# Patient Record
Sex: Female | Born: 1980 | Race: Black or African American | Hispanic: No | Marital: Single | Smoking: Former smoker
Health system: Southern US, Community
[De-identification: ages and names within clinical notes are randomized; demographics above are authoritative.]

## PROBLEM LIST (undated history)

## (undated) DIAGNOSIS — I340 Nonrheumatic mitral (valve) insufficiency: Secondary | ICD-10-CM

## (undated) DIAGNOSIS — I10 Essential (primary) hypertension: Secondary | ICD-10-CM

## (undated) DIAGNOSIS — I509 Heart failure, unspecified: Secondary | ICD-10-CM

## (undated) HISTORY — DX: Nonrheumatic mitral (valve) insufficiency: I34.0

## (undated) HISTORY — DX: Essential (primary) hypertension: I10

---

## 2006-07-18 ENCOUNTER — Encounter: Admission: RE | Admit: 2006-07-18 | Discharge: 2006-07-18 | Payer: Self-pay | Admitting: Nephrology

## 2008-06-10 ENCOUNTER — Ambulatory Visit (HOSPITAL_COMMUNITY): Admission: RE | Admit: 2008-06-10 | Discharge: 2008-06-10 | Payer: Self-pay | Admitting: Family Medicine

## 2008-10-16 ENCOUNTER — Inpatient Hospital Stay (HOSPITAL_COMMUNITY): Admission: AD | Admit: 2008-10-16 | Discharge: 2008-10-16 | Payer: Self-pay | Admitting: Obstetrics

## 2008-10-28 ENCOUNTER — Observation Stay (HOSPITAL_COMMUNITY): Admission: RE | Admit: 2008-10-28 | Discharge: 2008-10-28 | Payer: Self-pay | Admitting: Family Medicine

## 2008-10-31 ENCOUNTER — Observation Stay (HOSPITAL_COMMUNITY): Admission: AD | Admit: 2008-10-31 | Discharge: 2008-10-31 | Payer: Self-pay | Admitting: Obstetrics & Gynecology

## 2008-11-03 ENCOUNTER — Inpatient Hospital Stay (HOSPITAL_COMMUNITY): Admission: AD | Admit: 2008-11-03 | Discharge: 2008-11-05 | Payer: Self-pay | Admitting: Obstetrics & Gynecology

## 2008-11-03 ENCOUNTER — Encounter: Payer: Self-pay | Admitting: Obstetrics & Gynecology

## 2008-11-03 ENCOUNTER — Ambulatory Visit: Payer: Self-pay | Admitting: Obstetrics and Gynecology

## 2009-03-30 ENCOUNTER — Encounter: Payer: Self-pay | Admitting: Cardiology

## 2009-04-22 ENCOUNTER — Ambulatory Visit: Payer: Self-pay | Admitting: Cardiology

## 2009-04-22 DIAGNOSIS — N12 Tubulo-interstitial nephritis, not specified as acute or chronic: Secondary | ICD-10-CM | POA: Insufficient documentation

## 2009-04-22 DIAGNOSIS — F4000 Agoraphobia, unspecified: Secondary | ICD-10-CM | POA: Insufficient documentation

## 2009-04-22 DIAGNOSIS — R002 Palpitations: Secondary | ICD-10-CM

## 2009-04-22 DIAGNOSIS — R55 Syncope and collapse: Secondary | ICD-10-CM | POA: Insufficient documentation

## 2009-04-22 DIAGNOSIS — R9431 Abnormal electrocardiogram [ECG] [EKG]: Secondary | ICD-10-CM

## 2009-04-22 DIAGNOSIS — G43909 Migraine, unspecified, not intractable, without status migrainosus: Secondary | ICD-10-CM | POA: Insufficient documentation

## 2009-04-22 DIAGNOSIS — R42 Dizziness and giddiness: Secondary | ICD-10-CM

## 2009-04-23 LAB — CONVERTED CEMR LAB
CO2: 21 meq/L (ref 19–32)
Magnesium: 2 mg/dL (ref 1.5–2.5)
Sodium: 137 meq/L (ref 135–145)
TSH: 0.33 microintl units/mL — ABNORMAL LOW (ref 0.35–5.50)

## 2009-04-30 ENCOUNTER — Encounter: Payer: Self-pay | Admitting: Cardiology

## 2009-04-30 ENCOUNTER — Ambulatory Visit: Payer: Self-pay | Admitting: Internal Medicine

## 2009-04-30 ENCOUNTER — Ambulatory Visit (HOSPITAL_COMMUNITY): Admission: RE | Admit: 2009-04-30 | Discharge: 2009-04-30 | Payer: Self-pay | Admitting: Cardiology

## 2009-04-30 ENCOUNTER — Ambulatory Visit: Payer: Self-pay

## 2009-05-11 ENCOUNTER — Encounter: Payer: Self-pay | Admitting: Cardiology

## 2009-05-12 ENCOUNTER — Ambulatory Visit: Payer: Self-pay | Admitting: Cardiology

## 2009-05-12 DIAGNOSIS — I428 Other cardiomyopathies: Secondary | ICD-10-CM

## 2009-05-14 ENCOUNTER — Telehealth: Payer: Self-pay | Admitting: Cardiology

## 2009-05-26 ENCOUNTER — Ambulatory Visit: Payer: Self-pay | Admitting: Cardiology

## 2009-05-26 ENCOUNTER — Encounter (INDEPENDENT_AMBULATORY_CARE_PROVIDER_SITE_OTHER): Payer: Self-pay | Admitting: Nurse Practitioner

## 2009-05-29 ENCOUNTER — Telehealth: Payer: Self-pay | Admitting: Cardiology

## 2009-06-16 ENCOUNTER — Ambulatory Visit: Payer: Self-pay

## 2009-06-16 ENCOUNTER — Ambulatory Visit: Payer: Self-pay | Admitting: Internal Medicine

## 2009-06-16 ENCOUNTER — Encounter (INDEPENDENT_AMBULATORY_CARE_PROVIDER_SITE_OTHER): Payer: Self-pay | Admitting: Nurse Practitioner

## 2009-06-16 DIAGNOSIS — I10 Essential (primary) hypertension: Secondary | ICD-10-CM

## 2009-06-16 LAB — CONVERTED CEMR LAB
CO2: 27 meq/L (ref 19–32)
Calcium: 9 mg/dL (ref 8.4–10.5)
Pro B Natriuretic peptide (BNP): 29 pg/mL (ref 0.0–100.0)

## 2009-06-24 ENCOUNTER — Telehealth: Payer: Self-pay | Admitting: Cardiology

## 2009-06-25 ENCOUNTER — Ambulatory Visit: Payer: Self-pay | Admitting: Internal Medicine

## 2009-06-30 ENCOUNTER — Telehealth: Payer: Self-pay | Admitting: Cardiology

## 2009-07-02 ENCOUNTER — Ambulatory Visit: Payer: Self-pay | Admitting: Cardiology

## 2009-07-09 ENCOUNTER — Telehealth (INDEPENDENT_AMBULATORY_CARE_PROVIDER_SITE_OTHER): Payer: Self-pay | Admitting: *Deleted

## 2009-07-14 ENCOUNTER — Encounter: Payer: Self-pay | Admitting: Cardiology

## 2009-07-20 ENCOUNTER — Ambulatory Visit: Payer: Self-pay | Admitting: Cardiology

## 2009-07-20 DIAGNOSIS — R079 Chest pain, unspecified: Secondary | ICD-10-CM

## 2009-07-20 DIAGNOSIS — I080 Rheumatic disorders of both mitral and aortic valves: Secondary | ICD-10-CM

## 2009-08-25 ENCOUNTER — Ambulatory Visit: Payer: Self-pay | Admitting: Cardiology

## 2009-08-27 ENCOUNTER — Telehealth: Payer: Self-pay | Admitting: Cardiology

## 2009-09-10 ENCOUNTER — Telehealth: Payer: Self-pay | Admitting: Cardiology

## 2009-09-16 ENCOUNTER — Telehealth (INDEPENDENT_AMBULATORY_CARE_PROVIDER_SITE_OTHER): Payer: Self-pay | Admitting: *Deleted

## 2009-09-18 ENCOUNTER — Encounter (INDEPENDENT_AMBULATORY_CARE_PROVIDER_SITE_OTHER): Payer: Self-pay | Admitting: *Deleted

## 2009-10-14 ENCOUNTER — Telehealth: Payer: Self-pay | Admitting: Cardiology

## 2009-10-14 ENCOUNTER — Ambulatory Visit: Payer: Self-pay | Admitting: Cardiology

## 2009-10-17 LAB — CONVERTED CEMR LAB
BUN: 8 mg/dL (ref 6–23)
CO2: 23 meq/L (ref 19–32)
Chloride: 111 meq/L (ref 96–112)
Creatinine, Ser: 0.7 mg/dL (ref 0.4–1.2)
Glucose, Bld: 72 mg/dL (ref 70–99)
Potassium: 4.1 meq/L (ref 3.5–5.1)
Sodium: 140 meq/L (ref 135–145)

## 2010-07-27 NOTE — Assessment & Plan Note (Signed)
Summary: Increased SOB  pfh,rn   Visit Type:  Follow-up Primary Provider:  Dr. Bing Neighbors. Harper  CC:  CHF.  History of Present Illness: The patient presents for followup of the above. Since I last saw her she has had increasing shortness of breath. She reports that this is happening with activities such as walking about 100 yards on level ground. She occasionally has some shortness of breath at night lying flat. She doesn't weigh herself. She doesn't report swelling. She's not having a cough. She's had no fevers or chills. She has not had chest pain or palpitations. She is trying to watch her salt. She tolerated the initiation of enalapril at the last visit. She rarely now breast-feed her child.  Current Medications (verified): 1)  Ibuprofen 200 Mg Tabs (Ibuprofen) .... As Needed 2)  Ortho Micronor 0.35 Mg Tabs (Norethindrone (Contraceptive)) .... Once Daily At The Same Time 3)  Enalapril Maleate 2.5 Mg Tabs (Enalapril Maleate) .Marland Kitchen.. 1 By Mouth Two Times A Day 4)  Metoprolol Tartrate 100 Mg Tabs (Metoprolol Tartrate) .... 1/2 Tablet Twice A Day  Allergies (verified): 1)  ! Hydrocodone  Past History:  Past Medical History: Reviewed history from 07/20/2009 and no changes required. Pyelonephritis recurrent Cardiomyopathy Mitral regurgitation  Review of Systems       As stated in the HPI and negative for all other systems.   Vital Signs:  Patient profile:   30 year old female Height:      67 inches Weight:      204 pounds BMI:     32.07 Pulse rate:   72 / minute Resp:     16 per minute BP sitting:   124 / 86  (right arm)  Vitals Entered By: Marrion Coy, CNA (October 14, 2009 3:41 PM)  Physical Exam  General:  Well developed, well nourished, in no acute distress. Head:  normocephalic and atraumatic Eyes:  PERRLA/EOM intact; conjunctiva and lids normal. Mouth:  Teeth, gums and palate normal. Oral mucosa normal. Neck:  Neck supple, no JVD. No masses, thyromegaly or abnormal  cervical nodes. Chest Wall:  no deformities  Lungs:  Clear bilaterally to auscultation and percussion. Abdomen:  Bowel sounds positive; abdomen soft and non-tender without masses, organomegaly, or hernias noted. No hepatosplenomegaly. Msk:  Back normal, normal gait. Muscle strength and tone normal. Extremities:  No clubbing or cyanosis. Neurologic:  Alert and oriented x 3. Skin:  Intact without lesions or rashes. Psych:  Normal affect.   Detailed Cardiovascular Exam  Neck    Carotids: Carotids full and equal bilaterally without bruits.      Neck Veins: Normal, no JVD.    Heart    Inspection: no deformities or lifts noted.      Palpation: normal PMI with no thrills palpable.      Auscultation: regular rate and rhythm, S1, S2 without murmurs, rubs, gallops, or clicks.    Vascular    Abdominal Aorta: no palpable masses, pulsations, or audible bruits.      Femoral Pulses: normal femoral pulses bilaterally.      Pedal Pulses: normal pedal pulses bilaterally.      Radial Pulses: normal radial pulses bilaterally.      Peripheral Circulation: no clubbing, cyanosis, or edema noted with normal capillary refill.     Impression & Recommendations:  Problem # 1:  OTHER PRIMARY CARDIOMYOPATHIES (ICD-425.4) Today  I will change her to carvedilol and she is now rarely breast-feeding and is acceptable of this. It was agreed upon  by her pediatrician that she could start enalapril and we did that last time. I will up titrate this to 5 mg b.i.d. I will get a basic metabolic profile and BNP level today. I will give her 2 days of Lasix 20 mg and potassium 10 given the increased dyspnea.  Problem # 2:  HYPERTENSION, BENIGN (ICD-401.1) Her blood pressures being managed in the context of treating her cardiomyopathy.  Problem # 3:  MITRAL REGURGITATION (ICD-396.3) I will follow this up with an echocardiogram when I have finished titrating her meds were sent her clinically indicated.  Patient  Instructions: 1)  Your physician recommends that you schedule a follow-up appointment in: 1 month with Dr Antoine Poche 2)  Your physician has recommended you make the following change in your medication:  Stop Metoprolol, start Carvedilol 12.5 mg one twice a day, Incerase Enalapril to 5 mg twice a day, take Furosemide 20 mg for 2 days with Potassium Chlo 10 Meq for 2 days. 3)  Your physician has requested that you limit the intake of sodium (salt) in your diet to two grams daily. Please see [facility] handout. 4)  You have been diagnosed with Congestive Heart Failure or CHF.  CHF is a condition in which a problem with the structure or function of the heart impairs its ability to supply sufficient blood flow to meet the body's needs.  For further information please visit www.cardiosmart.org for detailed information on CHF. 5)  Your physician recommends that you weigh, daily, at the same time every day, and in the same amount of clothing.  Please record your daily weights on the handout provided and bring it to your next appointment. Prescriptions: POTASSIUM CHLORIDE CR 10 MEQ CR-TABS (POTASSIUM CHLORIDE) once a day with Furosemide  #30 x 0   Entered by:   Charolotte Capuchin, RN   Authorized by:   Rollene Rotunda, MD, [facility]   Signed by:   Charolotte Capuchin, RN on 10/14/2009   Method used:   Electronically to        [facility] [address] (retail)       [phone]       [place], [place]  [address]       Ph: [phone]       Fax: [phone]   RxID:   [id number] FUROSEMIDE 20 MG TABS (FUROSEMIDE) as directed  #30 x 0   Entered by:   Charolotte Capuchin, RN   Authorized by:   Rollene Rotunda, MD, [facility]   Signed by:   Charolotte Capuchin, RN on 10/14/2009   Method used:   Electronically to        [facility] [address] (retail)       [phone]       [place], [place]  [address]       Ph: [phone]       Fax:  [phone]   RxID:   [id number] CARVEDILOL 12.5 MG TABS (CARVEDILOL) one twice a day  #60 x 11   Entered by:   Charolotte Capuchin, RN   Authorized by:   Rollene Rotunda, MD, [facility]   Signed by:   Charolotte Capuchin, RN on 10/14/2009   Method used:   Electronically to        [facility] [address] (retail)       [phone]       [facility]  [place], [place]  [address]       Ph: [phone]       Fax: [phone]   RxID:   [id number] ENALAPRIL MALEATE 5 MG TABS (ENALAPRIL MALEATE) one twice a day  #60 x 6   Entered by:   Charolotte Capuchin, RN   Authorized by:   Rollene Rotunda, MD, [facility]   Signed by:   Charolotte Capuchin, RN on 10/14/2009   Method used:   Electronically to        [facility] [address] (retail)       [phone]       [place], [place]  [address]       Ph: [phone]       Fax: [phone]   RxID:   [id number]

## 2010-07-27 NOTE — Assessment & Plan Note (Signed)
Summary: htn   Visit Type:  Follow-up Primary Provider:  Dr. Bing Neighbors. Harper  CC:  Cardiomyopathy.  History of Present Illness: The patient presents for followup of the above. 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 She has been reluctant to take certain medications and some have been contraindicated as she is breast feeding. She is weaning herself off of breast-feeding. She says her blood pressures have been elevated at home. However, today's reading is low as reported. We are correlating her blood pressure cuff readings at home with hours in the office. Today I will titrate her beta blocker to metoprolol XL 50 mg daily. When she is not breast-feeding I will most likely switched to carvedilol and then to ACE inhibitors. She otherwise has been doing relatively well. She gets fatigued when she takes her beta blocker in the morning. However, she think she has some palpitations when she takes it at night. She's not had any presyncope or syncope. She has not had any shortness of breath, PND or orthopnea. She has no chest pain. She understands the risks of getting pregnant. She is using birth control.  Current Medications (verified): 1)  Ibuprofen 200 Mg Tabs (Ibuprofen) .... As Needed 2)  Metoprolol Succinate 25 Mg Xr24h-Tab (Metoprolol Succinate) .... Take One Tablet By Mouth Daily 3)  Ortho Micronor 0.35 Mg Tabs (Norethindrone (Contraceptive)) .... Once Daily At The Same Time 4)  Enalapril Maleate 5 Mg Tabs (Enalapril Maleate) .... Hold  Allergies (verified): 1)  ! Hydrocodone  Past History:  Past Medical History: Reviewed history from 05/12/2009 and no changes required. Pyelonephritis recurrent Cardiomyopathy  Review of Systems       As stated in the HPI and negative for all other systems.   Vital Signs:  Patient profile:   30 year old female Height:      67 inches Weight:      199 pounds BMI:     31.28 Pulse rate:   71 / minute Resp:     16 per minute BP sitting:    120 / 81  (left arm)  Vitals Entered By: Marrion Coy, CNA (July 02, 2009 9:34 AM)  Physical Exam  General:  The patient was alert and oriented in no acute distress. HEENT Normal.  Neck veins were flat, carotids were brisk.  Lungs were clear.  Heart sounds were regular without murmurs or gallops.  Abdomen was soft with active bowel sounds. There is no clubbing cyanosis or edema. Skin Warm and dry  Head:  normocephalic and atraumatic Eyes:  PERRLA/EOM intact; conjunctiva and lids normal. Mouth:  Teeth, gums and palate normal. Oral mucosa normal. Neck:  Neck supple, no JVD. No masses, thyromegaly or abnormal cervical nodes. Chest Wall:  no deformities or breast masses noted Lungs:  Clear bilaterally to auscultation and percussion. Heart:  Non-displaced PMI, chest non-tender; regular rate and rhythm, S1, S2 without murmurs, rubs or gallops. Carotid upstroke normal, no bruit. Normal abdominal aortic size, no bruits. Femorals normal pulses, no bruits. Pedals normal pulses. No edema, no varicosities. Abdomen:  Bowel sounds positive; abdomen soft and non-tender without masses, organomegaly, or hernias noted. No hepatosplenomegaly. Msk:  Back normal, normal gait. Muscle strength and tone normal. Neurologic:  Alert and oriented x 3. Skin:  Intact without lesions or rashes. Psych:  Normal affect.   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 She will not start the ACE inhibitor until she is  done breast-feeding. She will let me know when this happens. If she has increasing palpitations, presyncope or syncope I need to know immediately.  Problem # 2:  HYPERTENSION, BENIGN (ICD-401.1) We checked her blood pressure and it is normal. Her blood pressure cuff is not correlating at home. She will exchange this. I will continue to titrate her meds however.  Patient Instructions: 1)  Your physician recommends  that you schedule a follow-up appointment in: as scheduled 2)  Your physician has recommended you make the following change in your medication: Increase Metoprolol to 50 mg a day 3)  You have been diagnosed with Congestive Heart Failure or CHF.  CHF is a condition in which a problem with the structure or function of the heart impairs its ability to supply sufficient blood flow to meet the body's needs.  For further information please visit www.cardiosmart.org for detailed information on CHF. 4)  Your physician recommends that you weigh, daily, at the same time every day, and in the same amount of clothing.  Please record your daily weights on the handout provided and bring it to your next appointment. Prescriptions: METOPROLOL SUCCINATE 50 MG XR24H-TAB (METOPROLOL SUCCINATE) one daily  #30 x 11   Entered by:   Charolotte Capuchin, RN   Authorized by:   Rollene Rotunda, MD, [facility]   Signed by:   Charolotte Capuchin, RN on 07/02/2009   Method used:   Electronically to        [facility]  [address] [phone]* (retail)       [address]       [place], [place]  [address]       Ph: [phone] or [phone]       Fax: [phone]   RxID:   [id number]

## 2010-07-27 NOTE — Progress Notes (Signed)
Summary: Request call about medication and her appt on Monday  Phone Note Call from Patient Call back at Home Phone [phone]   Caller: Patient Summary of Call: Pt request call regarding her medication and appt on Monday Initial call taken by: Judie Grieve,  September 10, 2009 11:37 AM  Follow-up for Phone Call        when she got rx filled her Medicaid was cancelled. needs to cancel appointment until she gets that straightened out because she cant afford to be seen any other way.  needs rx for Metoprolol sent to [facility]. Follow-up by: Charolotte Capuchin, RN,  September 10, 2009 4:14 PM    New/Updated Medications: METOPROLOL TARTRATE 100 MG TABS (METOPROLOL TARTRATE) 1/2 tablet twice a day Prescriptions: METOPROLOL TARTRATE 100 MG TABS (METOPROLOL TARTRATE) 1/2 tablet twice a day  #30 x 11   Entered by:   Charolotte Capuchin, RN   Authorized by:   Rollene Rotunda, MD, [facility]   Signed by:   Charolotte Capuchin, RN on 09/10/2009   Method used:   Electronically to        [facility] [address] (retail)       [phone]       [place], [place]  [address]       Ph: [phone]       Fax: [phone]   RxID:   [id number]

## 2010-07-27 NOTE — Progress Notes (Signed)
Summary: appt/meds  Phone Note Call from Patient Call back at Home Phone [phone]   Caller: Patient Reason for Call: Talk to Nurse Summary of Call: request to speak to nurse about appt and meds Initial call taken by: Migdalia Dk,  October 14, 2009 10:14 AM  Follow-up for Phone Call        very SOB, appt given for 3:15 today. Follow-up by: Charolotte Capuchin, RN,  October 14, 2009 1:29 PM

## 2010-07-27 NOTE — Miscellaneous (Signed)
Summary: Orders Update  Clinical Lists Changes  Orders: Added new Test order of TLB-BMP (Basic Metabolic Panel-BMET) (80048-METABOL) - Signed Added new Test order of TLB-BNP (B-Natriuretic Peptide) (83880-BNPR) - Signed 

## 2010-07-27 NOTE — Assessment & Plan Note (Signed)
Summary: per check out/sf   Visit Type:  Follow-up Referring Provider:  Dr. Elvis Coil Primary Provider:  Dr. Bing Neighbors. Sarah Reilly  CC:  CHF.  History of Present Illness: The patient presents for followup of her cardiomyopathy. At the last appointment we increased the Toprol to 50 mg daily. She has had no new problems with this.  She is still short of breath walking 30 yards on level ground. She is not describing PND or orthopnea. She might get short of breath when she is in conversation. She does have some chest discomfort that occurs with certain movements. It can be a left shoulder. His moderate in intensity. There are no associated symptoms. She thinks this might be related to stress as her father is undergoing surgery and she has no close relatives in [place].  Current Medications (verified): 1)  Ibuprofen 200 Mg Tabs (Ibuprofen) .... As Needed 2)  Metoprolol Succinate 50 Mg Xr24h-Tab (Metoprolol Succinate) .... One Daily 3)  Ortho Micronor 0.35 Mg Tabs (Norethindrone (Contraceptive)) .... Once Daily At The Same Time 4)  Enalapril Maleate 5 Mg Tabs (Enalapril Maleate) .... Hold  Allergies (verified): 1)  ! Hydrocodone  Past History:  Past Medical History: Pyelonephritis recurrent Cardiomyopathy Mitral regurgitation  Review of Systems       As stated in the HPI and negative for all other systems.   Vital Signs:  Patient profile:   30 year old female Height:      67 inches Weight:      202 pounds BMI:     31.75 Pulse rate:   74 / minute Resp:     16 per minute BP sitting:   117 / 73  (right arm)  Vitals Entered By: Marrion Coy, CNA (July 20, 2009 10:02 AM)  Physical Exam  General:  Well developed, well nourished, in no acute distress. Head:  normocephalic and atraumatic Eyes:  PERRLA/EOM intact; conjunctiva and lids normal. Mouth:  Teeth, gums and palate normal. Oral mucosa normal. Neck:  Neck supple, no JVD. No masses, thyromegaly or abnormal cervical  nodes. Chest Wall:  no deformities or breast masses noted Lungs:  Clear bilaterally to auscultation and percussion. Abdomen:  Bowel sounds positive; abdomen soft and non-tender without masses, organomegaly, or hernias noted. No hepatosplenomegaly. Msk:  Back normal, normal gait. Muscle strength and tone normal. Extremities:  No clubbing or cyanosis. Neurologic:  Alert and oriented x 3. Skin:  Intact without lesions or rashes. Cervical Nodes:  no significant adenopathy Axillary Nodes:  no significant adenopathy Inguinal Nodes:  no significant adenopathy Psych:  Normal affect.   Detailed Cardiovascular Exam  Neck    Carotids: Carotids full and equal bilaterally without bruits.      Neck Veins: Normal, no JVD.    Heart    Inspection: no deformities or lifts noted.      Palpation: normal PMI with no thrills palpable.      Auscultation: regular rate and rhythm, S1, S2 without murmurs, rubs, gallops, or clicks.    Vascular    Abdominal Aorta: no palpable masses, pulsations, or audible bruits.      Femoral Pulses: normal femoral pulses bilaterally.      Pedal Pulses: normal pedal pulses bilaterally.      Radial Pulses: normal radial pulses bilaterally.      Peripheral Circulation: no clubbing, cyanosis, or edema noted with normal capillary refill.     EKG  Procedure date:  07/20/2009  Findings:      sinus rhythm, rate 73,  axis within normal limits, intervals within normal limits, LVH by voltage criteria, diffuse T wave inversions unchanged from previous  Impression & Recommendations:  Problem # 1:  OTHER PRIMARY CARDIOMYOPATHIES (ICD-425.4) The patient is still breast-feeding. Therefore, I cannot start an ACE inhibitor. She does not want to use carvedilol per previous conversations. I will titrate her Toprol to 20 5 in the evening in addition to the 50 mg of XL in the morning. She is euvolemic by today's exam. Her BNP done a few weeks ago was within normal limits. In one month she  expects to be done breast-feeding at which point I will begin ACE inhibitors and transition to carvedilol.  Problem # 2:  MITRAL REGURGITATION (ICD-396.3) She does have moderate mitral regurgitation on the previous echo. We will follow this up in the months to come when I have titrated her meds to see if we have had improvement in ejection fraction and MR.  Problem # 3:  CHEST PAIN (ICD-786.50) This is intermittent and exacerbated by certain movements. He is very atypical for angina.  She has no cardiovascular risk factors. At this point I think the pretest probability of obstructive coronary disease is very low. She will need stress perfusion imaging in the future to evaluate etiology of her cardiomyopathy. When I next see her I will plan a stress perfusion study though I do not think one is needed acutely for these current symptoms.  Patient Instructions: 1)  Your physician recommends that you schedule a follow-up appointment in: 1 month with Dr Antoine Poche 2)  Your physician has recommended you make the following change in your medication: Increase metoprolol to 50 in am the 25 mg in pm 3)  You have been diagnosed with Congestive Heart Failure or CHF.  CHF is a condition in which a problem with the structure or function of the heart impairs its ability to supply sufficient blood flow to meet the body's needs.  For further information please visit www.cardiosmart.org for detailed information on CHF. 4)  Your physician recommends that you weigh, daily, at the same time every day, and in the same amount of clothing.  Please record your daily weights on the handout provided and bring it to your next appointment.

## 2010-07-27 NOTE — Assessment & Plan Note (Signed)
Summary: 1 MONTH ROV/SL   Visit Type:  Follow-up Primary Provider:  Dr. Bing Neighbors. Harper  CC:  CHF.  History of Present Illness: The patient presents for followup of her cardiomyopathy. We have a lady medication choices as she has been breast-feeding. She is going to start breast-feeding once a day and was advised by her pediatrician that she could start enalapril. She complains still of shortness of breath with moderate to mild activities such as walking on level ground and 25 yards. She think she has a little trouble lying flat back with shortness of breath. She is not describing new swelling. She has had a steady weight gain probably because of dietary indiscretion. I don't believe that she is completely compliant with salt restriction. She's not describing new palpitations, presyncope or syncope. She's had occasional sharp shooting chest pain and neck pain not reproducible.  Current Medications (verified): 1)  Ibuprofen 200 Mg Tabs (Ibuprofen) .... As Needed 2)  Metoprolol Succinate 50 Mg Xr24h-Tab (Metoprolol Succinate) .... One in Am and 1/2 in Pm 3)  Ortho Micronor 0.35 Mg Tabs (Norethindrone (Contraceptive)) .... Once Daily At The Same Time 4)  Enalapril Maleate 5 Mg Tabs (Enalapril Maleate) .... 1/2 Tablet Two Times A Day  Allergies (verified): 1)  ! Hydrocodone  Past History:  Past Medical History: Reviewed history from 07/20/2009 and no changes required. Pyelonephritis recurrent Cardiomyopathy Mitral regurgitation  Review of Systems       As stated in the HPI and negative for all other systems.   Vital Signs:  Patient profile:   30 year old female Height:      67 inches Weight:      203 pounds BMI:     31.91 Pulse rate:   75 / minute Resp:     16 per minute BP sitting:   126 / 78  (right arm)  Vitals Entered By: Marrion Coy, CNA (August 25, 2009 11:18 AM)  Physical Exam  General:  Well developed, well nourished, in no acute distress. Head:  normocephalic  and atraumatic Eyes:  PERRLA/EOM intact; conjunctiva and lids normal. Mouth:  Teeth, gums and palate normal. Oral mucosa normal. Neck:  Neck supple, no JVD. No masses, thyromegaly or abnormal cervical nodes. Chest Wall:  no deformities or breast masses noted Lungs:  Clear bilaterally to auscultation and percussion. Heart:  Non-displaced PMI, chest non-tender; regular rate and rhythm, S1, S2 without murmurs, rubs or gallops. Carotid upstroke normal, no bruit. Normal abdominal aortic size, no bruits. Femorals normal pulses, no bruits. Pedals normal pulses. No edema, no varicosities. Abdomen:  Bowel sounds positive; abdomen soft and non-tender without masses, organomegaly, or hernias noted. No hepatosplenomegaly. Msk:  Back normal, normal gait. Muscle strength and tone normal. Extremities:  No clubbing or cyanosis. Skin:  Intact without lesions or rashes. Psych:  Normal affect.   EKG  Procedure date:  08/25/2009  Findings:      sinus rhythm, rate 75, axis within normal limits, intervals within normal limits, inferolateral T wave inversion unchanged from previous.  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 We did research and agree with her pediatrician that she can now start the enalapril 2.5 mg twice daily. She will continue on the beta blocker. I reemphasized salt restriction and weight loss.  Problem # 2:  HYPERTENSION, BENIGN (ICD-401.1) Her blood pressure will be treated in the context of managing her cardiomyopathy.  Problem #  3:  PALPITATIONS (ICD-785.1) She has no new complaints of this and no further testing is indicated at this point. Orders: EKG w/ Interpretation (93000)  Patient Instructions: 1)  Your physician recommends that you schedule a follow-up appointment in: 2 WEEKS 2)  Your physician has recommended you make the following change in your medication:  START ENALAPRIL 2.5 MG TWICE A DAY

## 2010-07-27 NOTE — Progress Notes (Signed)
  Recieved Request for Records fron [facility] forwarded to [facility] for processing. Sarah Reilly  July 09, 2009 8:37 AM

## 2010-07-27 NOTE — Progress Notes (Signed)
  Recieved Request for Records from [facility] forwarded to [facility] for processing [facility]  September 16, 2009 8:40 AM

## 2010-07-27 NOTE — Letter (Signed)
Summary: Appointment - Missed  [facility], Main Office  [address]   [place], [place] [address]   Phone: [phone]  Fax: [phone]     September 18, 2009 MRN: [id number]   DAJHA URQUILLA [address] [place], [place]  [address]   Dear Ms. Kempa,  Our records indicate you missed your appointment on March 21,2011                       with Dr.Hochrein.  It is very important that we reach you to reschedule this appointment. We look forward to participating in your health care needs. Please contact us at the number listed above at your earliest convenience to reschedule this appointment.     Sincerely,  Lorne Skeens  [facility] Scheduling Team

## 2010-07-27 NOTE — Procedures (Signed)
Summary: blood pressure report  blood pressure report   Imported By: Mirna Mires 07/28/2009 14:33:48  _____________________________________________________________________  External Attachment:    Type:   Image     Comment:   External Document

## 2010-07-27 NOTE — Progress Notes (Signed)
Summary: needs letter dictated per JH//BP ELEVATED  Phone Note Call from Patient Call back at Home Phone [phone]   Caller: Patient Reason for Call: Talk to Nurse Summary of Call: PT STATES WE WERE TO CALL HER YESTERDAY... HAS NOT HEARD ANYTHING  Initial call taken by: Migdalia Dk,  June 30, 2009 11:46 AM  Follow-up for Phone Call        still needs letter for disability.  Also saw Dr Crawford Givens week and she understood him to say if she didn't take the beta blockers she would "be dead within 6 months".  Pt is concerned because that is not what she understood from Dr Antoine Poche.  Also pt.s BP is remaining elevated per her blood pressure monitor, she is requested to come in Thursday to see me to check her monitor.  She also hasn't started Enalapril as ordered d/t concerns r/t medications.  Will have Dr Antoine Poche review and contact pt.  Sander Nephew, RN Follow-up by: Charolotte Capuchin, RN,  June 30, 2009 6:49 PM  Additional Follow-up for Phone Call Additional follow up Details #1::        This patient needs to come in for another appt to discuss her reservations about meds and plans for breastfeeding. Additional Follow-up by: Rollene Rotunda, MD, [facility],  June 30, 2009 6:42 PM    Additional Follow-up for Phone Call Additional follow up Details #2::    appt given for 07/02/2009 at 9:30  Follow-up by: Charolotte Capuchin, RN,  July 02, 2009 9:22 AM

## 2010-07-27 NOTE — Progress Notes (Signed)
Summary: REFILL  Phone Note Refill Request Message from:  Patient on August 27, 2009 11:00 AM  Refills Requested: Medication #1:  ENALAPRIL MALEATE 5 MG TABS 1/2 TABLET two times a day. PT NEED THIS MEDICATION CHANGED TO ENAPRIL 2.5 MG [facility] [address] [phone]  Initial call taken by: Judie Grieve,  August 27, 2009 11:02 AM  Follow-up for Phone Call        Spoke with pt. Pt wants to take 2.5mg  two times a day instead of halfing 5mg  tabs. Rx sent into pharmacy. Marrion Coy, CNA  August 27, 2009 11:09 AM  Follow-up by: Marrion Coy, CNA,  August 27, 2009 11:09 AM    New/Updated Medications: ENALAPRIL MALEATE 2.5 MG TABS (ENALAPRIL MALEATE) 1 by mouth two times a day Prescriptions: ENALAPRIL MALEATE 2.5 MG TABS (ENALAPRIL MALEATE) 1 by mouth two times a day  #60 x 6   Entered by:   Marrion Coy, CNA   Authorized by:   Rollene Rotunda, MD, [facility]   Signed by:   Marrion Coy, CNA on 08/27/2009   Method used:   [facility] to        [facility]  [address] [phone][facility] (retail)       [address]       [place], [place]  [address]       Ph: [phone] or [phone]       Fax: [phone]   RxID:   [id number]

## 2010-10-05 LAB — CBC
HCT: 28.1 % — ABNORMAL LOW (ref 36.0–46.0)
HCT: 33 % — ABNORMAL LOW (ref 36.0–46.0)
Hemoglobin: 11.1 g/dL — ABNORMAL LOW (ref 12.0–15.0)
Hemoglobin: 11.1 g/dL — ABNORMAL LOW (ref 12.0–15.0)
Hemoglobin: 11.3 g/dL — ABNORMAL LOW (ref 12.0–15.0)
MCHC: 33.7 g/dL (ref 30.0–36.0)
MCHC: 34 g/dL (ref 30.0–36.0)
MCHC: 34.1 g/dL (ref 30.0–36.0)
MCV: 81.8 fL (ref 78.0–100.0)
MCV: 81.9 fL (ref 78.0–100.0)
MCV: 82.3 fL (ref 78.0–100.0)
MCV: 82.3 fL (ref 78.0–100.0)
Platelets: 149 10*3/uL — ABNORMAL LOW (ref 150–400)
Platelets: 152 10*3/uL (ref 150–400)
RBC: 3.99 MIL/uL (ref 3.87–5.11)
RBC: 4.03 MIL/uL (ref 3.87–5.11)
RDW: 14.3 % (ref 11.5–15.5)
RDW: 14.5 % (ref 11.5–15.5)
WBC: 10 10*3/uL (ref 4.0–10.5)
WBC: 5.4 10*3/uL (ref 4.0–10.5)

## 2010-11-09 NOTE — H&P (Signed)
Sarah Reilly, KLOC            ACCOUNT NO.:  [id number]   MEDICAL RECORD NO.:  [id number]          PATIENT TYPE:  INP   LOCATION:  9167                          [facility]ILITY:  [facility]   PHYSICIAN:  Roseanna Rainbow, M.D.DATE OF BIRTH:  15-Feb-1981   DATE OF ADMISSION:  10/28/2008  DATE OF DISCHARGE:  10/28/2008                              HISTORY & PHYSICAL   CHIEF COMPLAINT:  The patient is a 30 year old para 3 with an estimated  date of confinement by LMP of October 21, 2008.  However, an ultrasound at  19 weeks that gives an estimated date of confinement of Oct 31, 2008 with  an intrauterine pregnancy between 39 plus weeks and 41 weeks for  induction of labor.   HISTORY OF PRESENT ILLNESS:  Please see the above.   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   ALLERGIES:  To cane, anesthetics, and HYDROCODONE.   MEDICATIONS:  Please see the medication reconciliation form.   PRENATAL LABORATORY DATA:  Chlamydia negative.  GBS negative.  Hemoglobin 11.9, hematocrit 36.5, and platelets 170,000.  Blood type is  AB positive.  Antibody screen negative.  RPR nonreactive.  Hepatitis B  surface antigen negative.  Rubella immune.  Hemoglobin electrophoresis  normal.  Varicella immune.  Pap smear negative.  Quad screen negative.  1-hour GTT 70.  Ultrasound of June 10, 2008, 19 weeks 4 days, no  previa, normal amniotic fluid with an estimated date of confinement of  Oct 31, 2008.   PAST OBSTETRICAL HISTORY:  In November 1999, she was delivered of a live  born female, full-term, 7 pounds 5 ounces vaginal delivery, no  complications.  In January 2001, she was delivered of a live born female,  full-term, 7 pounds vaginal delivery, no complications.  There is a  history of 3 subsequent voluntary terminations of pregnancy.  In April  2006, she was delivered of a live born female, full-term vaginal  delivery, weight 7 pounds, no complications.   PAST MEDICAL HISTORY:  Please  see the above.  History of agoraphobia,  migraine headaches, and history of pyelonephritis.   PAST SURGICAL HISTORY:  D and C.   SOCIAL HISTORY:  She was a former smoker.  She denies any alcohol use or  illicit drug use.   FAMILY HISTORY:  Remarkable for chronic hypertension, heart disease,  kidney disorder, seizure disorder.   REVIEW OF SYSTEMS:  Noncontributory.   PHYSICAL EXAMINATION:  VITAL SIGNS:  Stable, afebrile.  Fetal heart  tracing reassuring.  Tocodynamometer regular uterine contractions.  Sterile vaginal exam per the RN.  The cervix is 2 cm dilated.   ASSESSMENT:  1. Multipara at term for induction of labor.  2. Fetal heart tracing consistent with fetal well-being.  3. A favorable Bishop score.   PLAN:  Admission, low-dose Pitocin per protocol.      Roseanna Rainbow, M.D.  Electronically Signed     LAJ/MEDQ  D:  10/28/2008  T:  10/28/2008  Job:  [id number]

## 2010-11-09 NOTE — H&P (Signed)
Sarah Reilly, Sarah Reilly            ACCOUNT NO.:  [id number]   MEDICAL RECORD NO.:  [id number]          PATIENT TYPE:  INP   LOCATION:  9108                          [facility]ILITY[facility]   PHYSICIAN:  Roseanna Rainbow, M.D.DATE OF BIRTH:  December 05, 1980   DATE OF ADMISSION:  11/03/2008  DATE OF DISCHARGE:                              HISTORY & PHYSICAL   CHIEF COMPLAINT:  The patient is a 30 year old, para 3, with an  estimated date of confinement by LMP with an ultrasound EDC of Oct 31, 2008, with an intrauterine pregnancy between 40 and 42 weeks,  complaining of rupture of membranes.   HISTORY OF PRESENT ILLNESS:  Please see the above.  She reports  irregular contractions.  She reports clear fluid.  The patient is status  post 2 failed induction of labor attempts.   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   ALLERGIES:  Questionable allergy to CAINE ANESTHETICS and HYDROCODONE.   MEDICATIONS:  Please see the medication reconciliation form.   PRENATAL LABORATORY DATA:  Chlamydia probe negative.  GBS negative.  Hemoglobin 11.9, hematocrit 36.5, and platelets 170,000.  Blood type is  AB positive.  Antibody screen negative, RPR nonreactive, hepatitis B  surface antigen negative, rubella immune, hemoglobin electrophoresis  normal.  Varicella immune.  Pap smear negative.  Quad screen negative.  1-hour GTT 70.  Ultrasound on June 10, 2008, at 19 weeks 4 days, no  previa, normal amniotic fluid with an estimated date of confinement of  Oct 31, 2008.  Recent ultrasound on Oct 29, 2008, normal amniotic fluid  index.   PAST OBSTETRICAL HISTORY:  In November 1999, she was delivered of a live  born female, full term, 7 pounds 5 ounces, vaginal delivery, no  complications.  In January 2001, she was delivered of a live born female,  full term, 7 pounds, vaginal delivery, no complications.  There is a  history of 3 subsequent voluntary terminations of pregnancy.  In April  2006, she was delivered of a live born female, full term vaginal  delivery, weight 7 pounds, no complications.   PAST MEDICAL HISTORY:  Please see the above.  There is a history of  agoraphobia, migraine headaches, and pyelonephritis.   PAST SURGICAL HISTORY:  D and C.   SOCIAL HISTORY:  She was a former smoker.  She denies any alcohol or  illicit drug use.   FAMILY HISTORY:  Remarkable for chronic hypertension, heart disease,  kidney disorder, and seizure disorder.   REVIEW OF SYSTEMS:  GU:  Please see the above.   PHYSICAL EXAMINATION:  Vital signs stable and afebrile.  Fetal heart  tracing reassuring.  Tocodynamometer regular uterine contractions.  Sterile vaginal exam per the RN.   ASSESSMENT:  Multipara, at term, early labor.  Fetal heart tracing  consistent with fetal well-being.   PLAN:  Admission, possible augmentation of labor with low-dose Pitocin  per protocol.      Roseanna Rainbow, M.D.  Electronically Signed     LAJ/MEDQ  D:  11/03/2008  T:  11/04/2008  Job:  [id number]

## 2010-11-09 NOTE — Letter (Signed)
June 30, 2009    To whom it may concern.   RE:  Sarah Reilly, Sarah Reilly  MRN:  [id number]  /  DOB:  1980/08/15   [name] is under my care for cardiomyopathy.  With this, she has  had symptoms to include dyspnea.  She is at risk for event such as  cardiac arrest.  We are trying to manage this medically.  She will need  frequent medication changes and office visits.  This letter serves as a  request for consideration of disability.   Thank you for your attention in this matter.    Sincerely,      Rollene Rotunda, MD, [facility]  Electronically Signed    JH/MedQ  DD: 06/30/2009  DT: 07/01/2009  Job #: [id number]

## 2010-12-28 ENCOUNTER — Encounter: Payer: Self-pay | Admitting: Cardiology

## 2010-12-30 ENCOUNTER — Encounter: Payer: Self-pay | Admitting: Cardiology

## 2010-12-30 ENCOUNTER — Ambulatory Visit (INDEPENDENT_AMBULATORY_CARE_PROVIDER_SITE_OTHER): Payer: Self-pay | Admitting: Cardiology

## 2010-12-30 DIAGNOSIS — I10 Essential (primary) hypertension: Secondary | ICD-10-CM

## 2010-12-30 DIAGNOSIS — I428 Other cardiomyopathies: Secondary | ICD-10-CM

## 2010-12-30 DIAGNOSIS — R079 Chest pain, unspecified: Secondary | ICD-10-CM

## 2010-12-30 DIAGNOSIS — R002 Palpitations: Secondary | ICD-10-CM

## 2010-12-30 LAB — BRAIN NATRIURETIC PEPTIDE: Pro B Natriuretic peptide (BNP): 13 pg/mL (ref 0.0–100.0)

## 2010-12-30 NOTE — Assessment & Plan Note (Signed)
I'm going to start with a BNP level and an echocardiogram. First and foremost she has been counseled about the danger of getting pregnant again. If her ejection fraction is perfectly normal, given her problems with medical appearance and social situation, I would not try starting her on medication again. However, if her ejection fraction is low I would consider starting either an ACE or beta blocker. Of note she understands the teratogenicity of ACE inhibitors should she get pregnant.

## 2010-12-30 NOTE — Progress Notes (Signed)
HPI The patient presents after being gone from the clinic for many months. She has a postpartum cardiomyopathy. She's never really taken any of her medications. She was breast feeding and in what medicines. She's been homeless and this has complicated things. She hasn't taken any ACE inhibitors or beta blockers in many months. She still chronically sleeps at about 30. She does describe some shortness of breath with activity such as climbing a slight incline. She's had no new weight gain and no new edema. She has some atypical occasional chest discomfort. She has some rare palpitations but no presyncope or syncope. With her social situation she finds it hard to avoid salt. She does use birth control and is reported below.  Allergies  Allergen Reactions  . Hydrocodone     Current Outpatient Prescriptions  Medication Sig Dispense Refill  . ibuprofen (ADVIL,MOTRIN) 200 MG tablet Take 200 mg by mouth as needed.        Marland Kitchen DISCONTD: carvedilol (COREG) 12.5 MG tablet Take 12.5 mg by mouth 2 (two) times daily with a meal.        . DISCONTD: enalapril (VASOTEC) 5 MG tablet Take 5 mg by mouth daily.        Marland Kitchen DISCONTD: furosemide (LASIX) 20 MG tablet Take 20 mg by mouth daily.        Marland Kitchen DISCONTD: norethindrone (MICRONOR) 0.35 MG tablet Take 1 tablet by mouth daily.        Marland Kitchen DISCONTD: potassium chloride (KLOR-CON) 10 MEQ CR tablet Take 10 mEq by mouth daily.          Past Medical History  Diagnosis Date  . Pyelonephritis   . Cardiomyopathy   . Mitral regurgitation     No past surgical history on file.  ROS:  Positive for occasional numbness and tingling in her face and hands. Otherwise as stated in the HPI and negative for all other systems.   PHYSICAL EXAM BP 116/83  Pulse 95  Resp 16  Ht 5\' 8"  (1.727 m)  Wt 205 lb (92.987 kg)  BMI 31.17 kg/m2 GENERAL:  Well appearing HEENT:  Pupils equal round and reactive, fundi not visualized, oral mucosa unremarkable NECK:  No jugular venous distention,  waveform within normal limits, carotid upstroke brisk and symmetric, no bruits, no thyromegaly LYMPHATICS:  No cervical, inguinal adenopathy LUNGS:  Clear to auscultation bilaterally BACK:  No CVA tenderness CHEST:  Unremarkable HEART:  PMI not displaced or sustained,S1 and S2 within normal limits, no S3, positive S4, no clicks, no rubs, no murmurs ABD:  Flat, positive bowel sounds normal in frequency in pitch, no bruits, no rebound, no guarding, no midline pulsatile mass, no hepatomegaly, no splenomegaly, obese EXT:  2 plus pulses throughout, no edema, no cyanosis no clubbing SKIN:  No rashes no nodules NEURO:  Cranial nerves II through XII grossly intact, motor grossly intact throughout PSYCH:  Cognitively intact, oriented to person place and time  EKG:  Sinus rhythm, rate 94, left ventricle hypertrophy, QTC prolonged, T-wave inversions unchanged from previous  ASSESSMENT AND PLAN

## 2010-12-30 NOTE — Patient Instructions (Signed)
Please have blood work today (BNP). Please continue medications as listed. Your physician has requested that you have an echocardiogram. Echocardiography is a painless test that uses sound waves to create images of your heart. It provides your doctor with information about the size and shape of your heart and how well your heart's chambers and valves are working. This procedure takes approximately one hour. There are no restrictions for this procedure. Please schedule a follow up appointment with Dr Antoine Poche after your Echocardiogram.

## 2010-12-30 NOTE — Assessment & Plan Note (Signed)
She has rare atypical symptoms and no new EKG changes.  She will continue the meds as listed.

## 2010-12-30 NOTE — Assessment & Plan Note (Signed)
Her blood pressure is low.  I will consider meds only based on her EF.

## 2011-01-14 ENCOUNTER — Encounter: Payer: Self-pay | Admitting: Cardiology

## 2011-01-18 ENCOUNTER — Ambulatory Visit (INDEPENDENT_AMBULATORY_CARE_PROVIDER_SITE_OTHER): Payer: Self-pay | Admitting: Cardiology

## 2011-01-18 ENCOUNTER — Encounter: Payer: Self-pay | Admitting: Cardiology

## 2011-01-18 ENCOUNTER — Ambulatory Visit (HOSPITAL_COMMUNITY): Payer: Self-pay | Attending: Cardiology | Admitting: Radiology

## 2011-01-18 VITALS — BP 122/88 | HR 75 | Resp 16 | Ht 67.0 in | Wt 208.0 lb

## 2011-01-18 DIAGNOSIS — R002 Palpitations: Secondary | ICD-10-CM | POA: Insufficient documentation

## 2011-01-18 DIAGNOSIS — I379 Nonrheumatic pulmonary valve disorder, unspecified: Secondary | ICD-10-CM | POA: Insufficient documentation

## 2011-01-18 DIAGNOSIS — I10 Essential (primary) hypertension: Secondary | ICD-10-CM | POA: Insufficient documentation

## 2011-01-18 DIAGNOSIS — I5022 Chronic systolic (congestive) heart failure: Secondary | ICD-10-CM

## 2011-01-18 DIAGNOSIS — E669 Obesity, unspecified: Secondary | ICD-10-CM | POA: Insufficient documentation

## 2011-01-18 DIAGNOSIS — R072 Precordial pain: Secondary | ICD-10-CM | POA: Insufficient documentation

## 2011-01-18 DIAGNOSIS — I059 Rheumatic mitral valve disease, unspecified: Secondary | ICD-10-CM | POA: Insufficient documentation

## 2011-01-18 DIAGNOSIS — I079 Rheumatic tricuspid valve disease, unspecified: Secondary | ICD-10-CM | POA: Insufficient documentation

## 2011-01-18 DIAGNOSIS — I428 Other cardiomyopathies: Secondary | ICD-10-CM

## 2011-01-18 MED ORDER — LISINOPRIL 5 MG PO TABS: 5.0000 mg | ORAL_TABLET | Freq: Every day | ORAL | Status: DC

## 2011-01-18 NOTE — Assessment & Plan Note (Signed)
She is having no new symptoms consistent with angina.  No further work up is indicated.

## 2011-01-18 NOTE — Progress Notes (Signed)
HPI The patient presents after being gone from the clinic for many months. She has a postpartum cardiomyopathy. She's never really taken any of her medications. She was breast feeding and did not want medicines. She's been homeless and this has complicated things. She hasn't taken any ACE inhibitors or beta blockers in many months.  The patient returns today for followup. I did an echocardiogram and preliminarily demonstrates it to be about 20% with global hypokinesis and a posteriorly directed jet of mitral regurgitation. She continues to have the same dyspnea with exertion and sleeping at 30 degrees.  She has no new chest discomfort, neck or arm discomfort he she has no new palpitations, presyncope or syncope. She has had no new weight gain or edema.  Allergies  Allergen Reactions  . Hydrocodone     Current Outpatient Prescriptions  Medication Sig Dispense Refill  . ibuprofen (ADVIL,MOTRIN) 200 MG tablet Take 200 mg by mouth as needed.        . norethindrone (MICRONOR) 0.35 MG tablet Take 1 tablet by mouth daily.          Past Medical History  Diagnosis Date  . Pyelonephritis   . Cardiomyopathy   . Mitral regurgitation     No past surgical history on file.  ROS:  As stated in the HPI and negative for all other systems.   PHYSICAL EXAM BP 122/88  Pulse 75  Resp 16  Ht 5\' 7"  (1.702 m)  Wt 208 lb (94.348 kg)  BMI 32.58 kg/m2 GENERAL:  Well appearing HEENT:  Pupils equal round and reactive, fundi not visualized, oral mucosa unremarkable NECK:  No jugular venous distention, waveform within normal limits, carotid upstroke brisk and symmetric, no bruits, no thyromegaly LYMPHATICS:  No cervical, inguinal adenopathy LUNGS:  Clear to auscultation bilaterally BACK:  No CVA tenderness CHEST:  Unremarkable HEART:  PMI not displaced or sustained,S1 and S2 within normal limits, no S3, positive S4, no clicks, no rubs, no murmurs ABD:  Flat, positive bowel sounds normal in frequency in  pitch, no bruits, no rebound, no guarding, no midline pulsatile mass, no hepatomegaly, no splenomegaly, obese EXT:  2 plus pulses throughout, no edema, no cyanosis no clubbing SKIN:  No rashes no nodules NEURO:  Cranial nerves II through XII grossly intact, motor grossly intact throughout PSYCH:  Cognitively intact, oriented to person place and time  ASSESSMENT AND PLAN

## 2011-01-18 NOTE — Assessment & Plan Note (Signed)
Her blood pressure is not elevated and it will be treated in the context of treating her cardiomyopathy.

## 2011-01-18 NOTE — Patient Instructions (Addendum)
Please start Lisinopril 5 mg 1/2 tablet a day Stop Micronor Continue all other medications as listed. Follow up in 1 month

## 2011-01-18 NOTE — Assessment & Plan Note (Signed)
The patient has a postpartum cardiomyopathy. Her ejection fraction is no better than it was previously which isn't surprising as she would not take any medications. Her situation is complicated by her very poor social situation. I am trying to contact services in town. All of her medicines can be generic. Today on going to start lisinopril 2.5 mg daily and we will titrate over time. He understands all and fluid restriction. If her ejection fraction does not improve with medications she would be a good candidate. I have again expressed to her in no uncertain terms the risks of mortality if she were to get pregnant again. She is currently on birth control pills but she will consider more permanent alternatives.

## 2011-01-24 ENCOUNTER — Telehealth: Payer: Self-pay | Admitting: Cardiology

## 2011-01-24 NOTE — Telephone Encounter (Signed)
Test results

## 2011-01-24 NOTE — Telephone Encounter (Signed)
Reviewed results again with pt.  She understands that her EF is 20 to 25% and also understands the extreme importance of not getting pregnant again.  She has not been able to see her OB/GYN as of yet but is going to be scheduling an appointment.  Of note she is losing her house and will be pad-locked out of it probably tomorrow per her report.  She does not know at this time where she a her 5 children will go.  She is asking family (from [place]) for assistance.

## 2011-01-24 NOTE — Telephone Encounter (Signed)
Spoke with pt.  She has already been to [facility] 3 weeks ago and they are trying to help with housing.  Pt called the court house and the best she can tell she may have about 1 more week before they pad lock her home.  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  She is hopeful to be placed somewhere on Friday.  She has no income and her food stamps she doesn't get until 8/3.  She has applied for SS Disability twice and has been denied. I encouraged her to apply again.  She thanked me for my time.

## 2011-01-24 NOTE — Telephone Encounter (Signed)
Spoke with Erie Noe a Visual merchandiser at [employer] - she suggests the patient go to the [employer] at [address] phone number [phone].  This is a place homeless people can go during the day.   They will also possibly be able to help her find the resources she needs to get back on her feet.  I called the patient and left her a message of the information.

## 2011-02-22 ENCOUNTER — Ambulatory Visit: Payer: Self-pay | Admitting: Physician Assistant

## 2017-11-02 NOTE — Progress Notes (Signed)
Cardiology Office Note   Date:  11/05/2017   ID:  SUDIE BANDEL, DOB 1980-10-07, MRN [id number]  PCP:  Brock Bad, MD  Cardiologist:   No primary care provider on file. Referring:  Self  Chief Complaint  Patient presents with  . Cardiomyopathy      History of Present Illness: Sarah Reilly is a 37 y.o. female who is referred by herself for follow up of systolic HF.   I saw her in 2012 for this.  She had an EF of 20 - 25%.   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  She has had very difficult social situations in the past was not compliant with follow up.     When I last saw her in 2012 she had had her fourth child.  We are trying to manage her medically.  Unfortunately she moved to [place] and got pregnant again.  She then moved to [place] to be by her mother.  She reports that she was followed at [facility].  She had a high risk pregnancy and states she saw the OB GYNs and the cardiologist weekly.  At one point time it was suggested that she would get an LVAD but she did not want this.  I do not have any of these records and cannot access them electronically.  She says that her ejection fraction was at the last measure 24%.  She is now moved back to the area.  She has not had her medications in months.  She lives at home with her 37 year old, 37 year old, 37 year old, 77-year-old and 19-year-old.  She is had trouble getting her Medicaid.  She was late to this appointment because she had to take the bus.  She has no family to help her.  She reports that she will get short of breath walking a couple of blocks.  She does have some shortness of breath lying flat.  She has sleep apnea that is not treated.  However, all of this is baseline.  She gained quite a bit of weight with her pregnancy but this is back down but not to her previous baseline.  She has some lower extremity swelling.  She is not describing chest pressure, neck or arm discomfort.  She is not having any palpitations,  presyncope or syncope.  Past Medical History:  Diagnosis Date  . Cardiomyopathy   . HTN (hypertension)   . Mitral regurgitation     History reviewed. No pertinent surgical history.   Current Outpatient Medications  Medication Sig Dispense Refill  . aspirin-acetaminophen-caffeine (EXCEDRIN MIGRAINE) 250-250-65 MG tablet Take 2 tablets by mouth every 6 (six) hours as needed for headache.    . lisinopril (PRINIVIL,ZESTRIL) 10 MG tablet Take 1 tablet (10 mg total) by mouth daily. 90 tablet 3  . metoprolol succinate (TOPROL-XL) 50 MG 24 hr tablet Take 1 tablet (50 mg total) by mouth daily. Take with or immediately following a meal. 90 tablet 3   No current facility-administered medications for this visit.     Allergies:   Celery oil; Shellfish allergy; and Hydrocodone    Social History:  The patient  reports that she has quit smoking. She has never used smokeless tobacco. She reports that she drinks about 0.6 oz of alcohol per week. She reports that she does not use drugs.   Family History:  The patient's family history includes Diabetes in her mother; Heart disease in her mother; Heart murmur in her sister; Hypertension in her father and  mother.    ROS:  Please see the history of present illness.   Otherwise, review of systems are positive for none.   All other systems are reviewed and negative.    PHYSICAL EXAM: VS:  BP (!) 156/110   Pulse (!) 101   Ht 5\' 7"  (1.702 m)   Wt 242 lb 3.2 oz (109.9 kg)   BMI 37.93 kg/m  , BMI Body mass index is 37.93 kg/m. GENERAL:  Well appearing HEENT:  Pupils equal round and reactive, fundi not visualized, oral mucosa unremarkable NECK:  Mild jugular venous distention, waveform within normal limits, carotid upstroke brisk and symmetric, no bruits, no thyromegaly LYMPHATICS:  No cervical, inguinal adenopathy LUNGS:  Clear to auscultation bilaterally BACK:  No CVA tenderness CHEST:  Unremarkable HEART:  PMI not displaced or sustained,S1 and S2  within normal limits, no S3, no S4, no clicks, no rubs, no murmurs ABD:  Flat, positive bowel sounds normal in frequency in pitch, no bruits, no rebound, no guarding, no midline pulsatile mass, no hepatomegaly, no splenomegaly EXT:  2 plus pulses throughout, trace edema, no cyanosis no clubbing SKIN:  No rashes no nodules NEURO:  Cranial nerves II through XII grossly intact, motor grossly intact throughout PSYCH:  Cognitively intact, oriented to person place and time    EKG:  EKG is ordered today. The ekg ordered today demonstrates sinus rhythm, rate 101, axis within normal limits, voltage criteria for left ventricular hypertrophy with repolarization changes.   Recent Labs: 11/03/2017: BNP 175.2; BUN 8; Creatinine, Ser 0.72; Hemoglobin 13.0; Platelets 198; Potassium 4.2; Sodium 139; TSH 0.674    Lipid Panel No results found for: CHOL, TRIG, HDL, CHOLHDL, VLDL, LDLCALC, LDLDIRECT    Wt Readings from Last 3 Encounters:  11/03/17 242 lb 3.2 oz (109.9 kg)  01/18/11 208 lb (94.3 kg)  12/30/10 205 lb (93 kg)      Other studies Reviewed: Additional studies/ records that were reviewed today include: 2012 office records. Review of the above records demonstrates:  Please see elsewhere in the note.     ASSESSMENT AND PLAN:  POST PARTUM CARDIOMYOPATHY:    The patient is actually done remarkably well given her lack of care and her chronically low ejection fraction.  Most complicating factors are probably social.  I am going to check blood work today to include the BMET, BNP TSH and CBC.  I am going to start back Toprol-XL 50 mg.  She reports she was previously on 100.  I will start lisinopril 10 mg daily she thinks she was previously on 20.  She thinks she could afford these medications if she can get them for a few dollars.  I am going asked the heart failure clinic for their help particularly with the social situation to include transportation and perhaps help in getting her Medicaid.  We will  then pursue med titration and further advanced therapies as needed and based on her compliance going forward.  He is a very pleasant personality who I hope will try to help her self.  She and I talked about this today.   I am going to try to find her a primary care MD.    MODERATE MR: This will be evaluated at the time of her echo.  HTN:  This is being managed in the context of treating his CHF   Current medicines are reviewed at length with the patient today.  The patient does not have concerns regarding medicines.  The following changes have been  made:  As above.   Labs/ tests ordered today include:   Orders Placed This Encounter  Procedures  . CBC  . Basic Metabolic Panel (BMET)  . B Nat Peptide  . TSH  . CBC  . Basic metabolic panel  . Brain natriuretic peptide  . EKG 12-Lead  . ECHOCARDIOGRAM COMPLETE     Disposition:   FU with me after the HF clinic.     Signed, Rollene Rotunda, MD  11/05/2017 3:34 PM    [facility]

## 2017-11-03 ENCOUNTER — Encounter: Payer: Self-pay | Admitting: Cardiology

## 2017-11-03 ENCOUNTER — Ambulatory Visit (INDEPENDENT_AMBULATORY_CARE_PROVIDER_SITE_OTHER): Payer: Self-pay | Admitting: Cardiology

## 2017-11-03 VITALS — BP 156/110 | HR 101 | Ht 67.0 in | Wt 242.2 lb

## 2017-11-03 DIAGNOSIS — Z79899 Other long term (current) drug therapy: Secondary | ICD-10-CM

## 2017-11-03 DIAGNOSIS — I080 Rheumatic disorders of both mitral and aortic valves: Secondary | ICD-10-CM

## 2017-11-03 DIAGNOSIS — O903 Peripartum cardiomyopathy: Secondary | ICD-10-CM

## 2017-11-03 DIAGNOSIS — R5383 Other fatigue: Secondary | ICD-10-CM

## 2017-11-03 DIAGNOSIS — R0602 Shortness of breath: Secondary | ICD-10-CM

## 2017-11-03 MED ORDER — LISINOPRIL 10 MG PO TABS: 10.0000 mg | ORAL_TABLET | Freq: Every day | ORAL | 3 refills | Status: DC

## 2017-11-03 MED ORDER — METOPROLOL SUCCINATE ER 50 MG PO TB24: 50.0000 mg | ORAL_TABLET | Freq: Every day | ORAL | 3 refills | Status: DC

## 2017-11-03 MED ORDER — LISINOPRIL 10 MG PO TABS
10.0000 mg | ORAL_TABLET | Freq: Every day | ORAL | 3 refills | Status: DC
Start: 2017-11-03 — End: 2018-01-17

## 2017-11-03 NOTE — Patient Instructions (Signed)
Medication Instructions:  START- Lisinopril 10 mg daily START- Metoprolol Succinate 50 mg daily  If you need a refill on your cardiac medications before your next appointment, please call your pharmacy.  Labwork: CBC, BMP, BNP and TSH HERE IN OUR OFFICE AT [facility]  Take the provided lab slips with you to the lab for your blood draw.   You will NOT need to fast   Testing/Procedures: Your physician has requested that you have an echocardiogram. Echocardiography is a painless test that uses sound waves to create images of your heart. It provides your doctor with information about the size and shape of your heart and how well your heart's chambers and valves are working. This procedure takes approximately one hour. There are no restrictions for this procedure.  Follow-Up: Your physician recommends that you schedule a follow-up appointment in: Heart Failure   Your physician wants you to follow-up in: After Test.     Thank you for choosing [facility] at [facility]!!

## 2017-11-04 LAB — BASIC METABOLIC PANEL
BUN/Creatinine Ratio: 11 (ref 9–23)
BUN: 8 mg/dL (ref 6–20)
CALCIUM: 9.3 mg/dL (ref 8.7–10.2)
CO2: 19 mmol/L — AB (ref 20–29)
CREATININE: 0.72 mg/dL (ref 0.57–1.00)
Chloride: 105 mmol/L (ref 96–106)
GFR calc Af Amer: 124 mL/min/{1.73_m2} (ref >59)
GFR, EST NON AFRICAN AMERICAN: 107 mL/min/{1.73_m2} (ref >59)
GLUCOSE: 82 mg/dL (ref 65–99)
Potassium: 4.2 mmol/L (ref 3.5–5.2)
Sodium: 139 mmol/L (ref 134–144)

## 2017-11-04 LAB — CBC
HEMOGLOBIN: 13 g/dL (ref 11.1–15.9)
Hematocrit: 39 % (ref 34.0–46.6)
MCH: 26.1 pg — ABNORMAL LOW (ref 26.6–33.0)
MCHC: 33.3 g/dL (ref 31.5–35.7)
MCV: 78 fL — ABNORMAL LOW (ref 79–97)
Platelets: 198 10*3/uL (ref 150–379)
RBC: 4.98 x10E6/uL (ref 3.77–5.28)
RDW: 14.2 % (ref 12.3–15.4)
WBC: 5.3 10*3/uL (ref 3.4–10.8)

## 2017-11-04 LAB — TSH: TSH: 0.674 u[IU]/mL (ref 0.450–4.500)

## 2017-11-04 LAB — BRAIN NATRIURETIC PEPTIDE: BNP: 175.2 pg/mL — AB (ref 0.0–100.0)

## 2017-11-05 ENCOUNTER — Encounter: Payer: Self-pay | Admitting: Cardiology

## 2017-11-05 DIAGNOSIS — Z79899 Other long term (current) drug therapy: Secondary | ICD-10-CM | POA: Insufficient documentation

## 2017-11-05 DIAGNOSIS — R5383 Other fatigue: Secondary | ICD-10-CM | POA: Insufficient documentation

## 2017-11-05 DIAGNOSIS — O903 Peripartum cardiomyopathy: Secondary | ICD-10-CM | POA: Insufficient documentation

## 2017-11-16 ENCOUNTER — Other Ambulatory Visit: Payer: Self-pay

## 2017-11-16 ENCOUNTER — Ambulatory Visit (HOSPITAL_COMMUNITY): Payer: Medicaid Other | Attending: Cardiology

## 2017-11-16 DIAGNOSIS — I340 Nonrheumatic mitral (valve) insufficiency: Secondary | ICD-10-CM | POA: Diagnosis not present

## 2017-11-16 DIAGNOSIS — I5021 Acute systolic (congestive) heart failure: Secondary | ICD-10-CM | POA: Diagnosis not present

## 2017-11-16 DIAGNOSIS — O903 Peripartum cardiomyopathy: Secondary | ICD-10-CM | POA: Diagnosis not present

## 2017-11-16 DIAGNOSIS — R0602 Shortness of breath: Secondary | ICD-10-CM

## 2017-11-16 DIAGNOSIS — I272 Pulmonary hypertension, unspecified: Secondary | ICD-10-CM | POA: Diagnosis not present

## 2017-11-16 DIAGNOSIS — I110 Hypertensive heart disease with heart failure: Secondary | ICD-10-CM | POA: Diagnosis not present

## 2017-11-21 ENCOUNTER — Telehealth: Payer: Self-pay | Admitting: *Deleted

## 2017-11-21 NOTE — Telephone Encounter (Signed)
-----   Message from Rollene Rotunda, MD sent at 11/19/2017 12:05 PM EDT ----- EF is severely reduced and unchanged from previous.  Severe MR.  Schedule a new patient appt with the HF clinic.  Call Ms. Venier with the results and send results.

## 2017-11-21 NOTE — Telephone Encounter (Signed)
Letter mailed to pt to give office a call to go over test rest, after calling pt several times with no return calls.

## 2017-11-28 ENCOUNTER — Telehealth: Payer: Self-pay | Admitting: Cardiology

## 2017-11-28 NOTE — Telephone Encounter (Signed)
New message    Please call with results.  Please call patient at [phone].

## 2017-11-29 NOTE — Telephone Encounter (Signed)
Spoke with pt about her result 

## 2017-12-07 ENCOUNTER — Encounter (HOSPITAL_COMMUNITY): Payer: Medicaid Other | Admitting: Internal Medicine

## 2018-01-17 ENCOUNTER — Ambulatory Visit (HOSPITAL_COMMUNITY)
Admission: RE | Admit: 2018-01-17 | Discharge: 2018-01-17 | Disposition: A | Discharge status: Home / Self Care | Payer: Medicaid Other | Source: Ambulatory Visit | Attending: Internal Medicine | Admitting: Internal Medicine

## 2018-01-17 ENCOUNTER — Encounter (HOSPITAL_BASED_OUTPATIENT_CLINIC_OR_DEPARTMENT_OTHER): Payer: Medicaid Other

## 2018-01-17 VITALS — BP 133/99 | HR 87 | Wt 233.8 lb

## 2018-01-17 DIAGNOSIS — I110 Hypertensive heart disease with heart failure: Secondary | ICD-10-CM | POA: Diagnosis not present

## 2018-01-17 DIAGNOSIS — R5383 Other fatigue: Secondary | ICD-10-CM

## 2018-01-17 DIAGNOSIS — E669 Obesity, unspecified: Secondary | ICD-10-CM | POA: Insufficient documentation

## 2018-01-17 DIAGNOSIS — I340 Nonrheumatic mitral (valve) insufficiency: Secondary | ICD-10-CM | POA: Diagnosis not present

## 2018-01-17 DIAGNOSIS — I080 Rheumatic disorders of both mitral and aortic valves: Secondary | ICD-10-CM | POA: Diagnosis not present

## 2018-01-17 DIAGNOSIS — Z79899 Other long term (current) drug therapy: Secondary | ICD-10-CM | POA: Insufficient documentation

## 2018-01-17 DIAGNOSIS — I5022 Chronic systolic (congestive) heart failure: Secondary | ICD-10-CM | POA: Insufficient documentation

## 2018-01-17 DIAGNOSIS — Z87891 Personal history of nicotine dependence: Secondary | ICD-10-CM | POA: Diagnosis not present

## 2018-01-17 DIAGNOSIS — I428 Other cardiomyopathies: Secondary | ICD-10-CM | POA: Diagnosis not present

## 2018-01-17 DIAGNOSIS — R0683 Snoring: Secondary | ICD-10-CM | POA: Insufficient documentation

## 2018-01-17 MED ORDER — SACUBITRIL-VALSARTAN 49-51 MG PO TABS: 1.0000 | ORAL_TABLET | Freq: Two times a day (BID) | ORAL | 6 refills | Status: DC

## 2018-01-17 NOTE — Patient Instructions (Signed)
Stop Lisinopril  Start Entresto 49/51 mg Twice daily STARTING Friday 7/26 AM  Your physician has recommended that you have a sleep study. This test records several body functions during sleep, including: brain activity, eye movement, oxygen and carbon dioxide blood levels, heart rate and rhythm, breathing rate and rhythm, the flow of air through your mouth and nose, snoring, body muscle movements, and chest and belly movement.  Please follow up with Cicero Duck, our heart failure pharmacist every 2 weeks for 3 visits  Your physician recommends that you schedule a follow-up appointment in: 3 months with Dr Gala Romney

## 2018-01-17 NOTE — Progress Notes (Signed)
ADVANCED HF CLINIC CONSULT NOTE  Referring Physician: Hochrein Primary Care: Pending Primary Cardiologist: Hochrein  HPI:  Sarah Reilly is a 37 y.o. 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 Antoine Poche for further management of HF and consideration of advanced therapies.   She developed HF after the birth of her 4th child in 2010. She was in [place] at the time. Saw Dr. Antoine Poche EF 20%. Did not have a heart cath.  She then moved back to [place] to be by her mother.  She was followed at [facility] by Dr. Fanny Bien.  She had a high-risk pregnancy and had her 5th  Child in 16. Apparently EF dropped to 15-20% for a period. They suggested ICD but she refused. She returned to [place] earlier this year and saw Dr. Antoine Poche. Was out of medicines for about about 1 month due to lack of insurance.   Echo in May EF 20-25% severe posterior MR RV normal.   Says she feels ok. Can get around but says she gets hot quick and sweats a lot. Can walk around the grocery store but has to stop 2-3x because she gets very hot and has to stand under a fan. Gets very SOB walking up steps. No edema, orthopnea or PND. Has CP all the time. Can hurt at any time. Worse while breathing in. Snores heavily and has been told she has OSA but has never had sleep study. No tobacco, drugs. Occasional ETOH. Feels depressed.   Not planning on having any more children. Has Implanon progesterone implant   Does not have transportation. Takes [employer] Goad or bus. Cooks most of her meals. Careful with salt.   On lisinopril 10 and Toprol 50. Not on a diuretic.    Review of Systems: [y] = yes, [ ]  = no   General: Weight gain [ ] ; Weight loss [ ] ; Anorexia [ ] ; Fatigue [ ] ; Fever [ ] ; Chills [ ] ; Weakness [ ]   Cardiac: Chest pain/pressure [ ] ; Resting SOB [ ] ; Exertional SOB [ ] ; Orthopnea [ ] ; Pedal Edema [ ] ; Palpitations [ ] ; Syncope [ ] ; Presyncope [ ] ; Paroxysmal nocturnal dyspnea[  ]  Pulmonary: Cough [ ] ; Wheezing[ ] ; Hemoptysis[ ] ; Sputum [ ] ; Snoring [ ]   GI: Vomiting[ ] ; Dysphagia[ ] ; Melena[ ] ; Hematochezia [ ] ; Heartburn[ ] ; Abdominal pain [ ] ; Constipation [ ] ; Diarrhea [ ] ; BRBPR [ ]   GU: Hematuria[ ] ; Dysuria [ ] ; Nocturia[ ]   Vascular: Pain in legs with walking [ ] ; Pain in feet with lying flat [ ] ; Non-healing sores [ ] ; Stroke [ ] ; TIA [ ] ; Slurred speech [ ] ;  Neuro: Headaches[ ] ; Vertigo[ ] ; Seizures[ ] ; Paresthesias[ ] ;Blurred vision [ ] ; Diplopia [ ] ; Vision changes [ ]   Ortho/Skin: Arthritis [ ] ; Joint pain [ ] ; Muscle pain [ ] ; Joint swelling [ ] ; Back Pain [ ] ; Rash [ ]   Psych: Depression[ ] ; Anxiety[ ]   Heme: Bleeding problems [ ] ; Clotting disorders [ ] ; Anemia [ ]   Endocrine: Diabetes [ ] ; Thyroid dysfunction[ ]    Past Medical History:  Diagnosis Date  . Cardiomyopathy   . HTN (hypertension)   . Mitral regurgitation     Current Outpatient Medications  Medication Sig Dispense Refill  . aspirin-acetaminophen-caffeine (EXCEDRIN MIGRAINE) 250-250-65 MG tablet Take 2 tablets by mouth every 6 (six) hours as needed for headache.    . lisinopril (PRINIVIL,ZESTRIL) 10 MG tablet Take 1 tablet (10 mg total)  by mouth daily. 90 tablet 3  . metoprolol succinate (TOPROL-XL) 50 MG 24 hr tablet Take 1 tablet (50 mg total) by mouth daily. Take with or immediately following a meal. 90 tablet 3   No current facility-administered medications for this encounter.     Allergies  Allergen Reactions  . Celery Oil Anaphylaxis  . Shellfish Allergy Anaphylaxis  . Hydrocodone       Social History   Socioeconomic History  . Marital status: Single    Spouse name: Not on file  . Number of children: Not on file  . Years of education: Not on file  . Highest education level: Not on file  Occupational History  . Not on file  Social Needs  . Financial resource strain: Not on file  . Food insecurity:    Worry: Not on file    Inability: Not on file  .  Transportation needs:    Medical: Not on file    Non-medical: Not on file  Tobacco Use  . Smoking status: Former Games developer  . Smokeless tobacco: Never Used  Substance and Sexual Activity  . Alcohol use: Yes    Alcohol/week: 0.6 oz    Types: 1 Glasses of wine per week  . Drug use: Never  . Sexual activity: Not on file  Lifestyle  . Physical activity:    Days per week: Not on file    Minutes per session: Not on file  . Stress: Not on file  Relationships  . Social connections:    Talks on phone: Not on file    Gets together: Not on file    Attends religious service: Not on file    Active member of club or organization: Not on file    Attends meetings of clubs or organizations: Not on file    Relationship status: Not on file  . Intimate partner violence:    Fear of current or ex partner: Not on file    Emotionally abused: Not on file    Physically abused: Not on file    Forced sexual activity: Not on file  Other Topics Concern  . Not on file  Social History Narrative   She has 4 children ages 22 and under.  She stays at home. She does not smoke cigarettes or drink alcohol. Does not get regular exercise.       Family History  Problem Relation Age of Onset  . Heart disease Mother        No details  . Hypertension Mother   . Diabetes Mother   . Hypertension Father   . Heart murmur Sister     Vitals:   01/17/18 1204  BP: (!) 133/99  Pulse: 87  SpO2: 100%  Weight: 233 lb 12.8 oz (106.1 kg)    PHYSICAL EXAM: General:  Well appearing. No respiratory difficulty HEENT: normal anicteric Neck: supple. no JVD. Carotids 2+ bilat; no bruits. No lymphadenopathy or thryomegaly appreciated. Cor: PMI laterally displaced. Tachy. Regular. No rubs, gallops. 2/6 MR Lungs: clear Abdomen: soft, nontender, nondistended. No hepatosplenomegaly. No bruits or masses. Good bowel sounds. Extremities: no cyanosis, clubbing, rash, edema Neuro: alert & oriented x 3, cranial nerves grossly  intact. moves all 4 extremities w/o difficulty. Affect pleasant.  ECG: Sinus tach 101 LVH with repol (QRS 88ms) Personally reviewed  ASSESSMENT & PLAN:  1. Chronic systolic HF - Onset in 2010. 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 No cath or cMRI yet - Echo 5/19 EF 20-25% with severe posterior MR.  RV ok. -  NYHA II-III - Volume status ok. Tachycardia is concerning  - Will switch lisinopril to Entresto 49/51 bid - Continue Toprol 50mg  daily - Refer to HF PharmD for continued med uptitration and addition of spiro  - Will eventually need cMRI and CPX - Repeat echo in 3 months after aggressive med titration. If EF still <= 35% will need ICD - Discussed need for medication compliance and ongoing contraception as well as possible need for advanced therapies down the road  2. Severe mitral regurgitation - this is functional MR.  - hopefully will improve with treatment of HF  3. Heavy snoring/probable OSA - Refer for sleep study  4. Social issues - Referred to HF SW today.   Arvilla Meres, MD  10:26 PM

## 2018-01-17 NOTE — Progress Notes (Signed)
CSW referred to assist patient with community resources. Patient states she lives at home with her husband and 5 children. She recently moved back from [place] and has SSI and medicaid. Patient reports that she has minimal resources as her husband is not working and she only receives $770 monthly. Patient receives food stamps monthly and denies any concerns with food insecurity. She states that her biggest issue at the moment is transportation. She has been using [employer] and taxi service to get around town. CSW discussed transport services through [facility] for medical appointments and number provided. Patient also asked about options for non medical transportation. CSW assisted with completion of a SCAT application which will allow for ease of transport issues for all non-medical needs. CSW encouraged patient to call CSW if further needs arise. Patient grateful for assistance and verbalizes understanding of follow up needed with SCAT application. Lasandra Beech, LCSW, CCSW-MCS [phone]

## 2018-02-05 ENCOUNTER — Telehealth: Payer: Self-pay | Admitting: Licensed Clinical Social Worker

## 2018-02-05 NOTE — Telephone Encounter (Signed)
CSW received call form patient stating she has no transportation to her clinic appointment tomorrow. CSW discussed medicaid transport for the future but will call a cab for tomorrow as too late to call medicaid transport. CSW will follow up with patient tomorrow in the clinic. Lasandra Beech, LCSW, CCSW-MCS [phone]

## 2018-02-06 ENCOUNTER — Ambulatory Visit (HOSPITAL_COMMUNITY)
Admission: RE | Admit: 2018-02-06 | Discharge: 2018-02-06 | Disposition: A | Discharge status: Home / Self Care | Payer: Medicaid Other | Source: Ambulatory Visit | Attending: Cardiology | Admitting: Cardiology

## 2018-02-06 VITALS — BP 124/90 | HR 82 | Wt 233.2 lb

## 2018-02-06 DIAGNOSIS — Z79899 Other long term (current) drug therapy: Secondary | ICD-10-CM | POA: Diagnosis not present

## 2018-02-06 DIAGNOSIS — I340 Nonrheumatic mitral (valve) insufficiency: Secondary | ICD-10-CM | POA: Diagnosis not present

## 2018-02-06 DIAGNOSIS — I5022 Chronic systolic (congestive) heart failure: Secondary | ICD-10-CM | POA: Diagnosis not present

## 2018-02-06 DIAGNOSIS — I428 Other cardiomyopathies: Secondary | ICD-10-CM | POA: Diagnosis not present

## 2018-02-06 DIAGNOSIS — E669 Obesity, unspecified: Secondary | ICD-10-CM | POA: Diagnosis not present

## 2018-02-06 DIAGNOSIS — O903 Peripartum cardiomyopathy: Secondary | ICD-10-CM

## 2018-02-06 DIAGNOSIS — I5020 Unspecified systolic (congestive) heart failure: Secondary | ICD-10-CM | POA: Diagnosis present

## 2018-02-06 DIAGNOSIS — I110 Hypertensive heart disease with heart failure: Secondary | ICD-10-CM | POA: Insufficient documentation

## 2018-02-06 DIAGNOSIS — R0683 Snoring: Secondary | ICD-10-CM | POA: Diagnosis not present

## 2018-02-06 LAB — BASIC METABOLIC PANEL
ANION GAP: 8 (ref 5–15)
BUN: 13 mg/dL (ref 6–20)
CALCIUM: 9.4 mg/dL (ref 8.9–10.3)
CO2: 24 mmol/L (ref 22–32)
Chloride: 108 mmol/L (ref 98–111)
Creatinine, Ser: 0.78 mg/dL (ref 0.44–1.00)
GFR calc Af Amer: >60 mL/min (ref >60)
Glucose, Bld: 98 mg/dL (ref 70–99)
Potassium: 4.6 mmol/L (ref 3.5–5.1)
Sodium: 140 mmol/L (ref 135–145)

## 2018-02-06 MED ORDER — SPIRONOLACTONE 25 MG PO TABS: 12.5000 mg | ORAL_TABLET | Freq: Every day | ORAL | 5 refills | Status: DC

## 2018-02-06 NOTE — Progress Notes (Signed)
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 Antoine Poche for further management of HF and consideration of advanced therapies.   She developed HF after the birth of her 4th child in 2010. She was in [place] at the time. Saw Dr. Antoine Poche EF 20%. Did not have a heart cath. She then moved back to [place] to be by her mother. She was followed at [facility] by Dr. Fanny Bien. She had a high-risk pregnancy and had her 5th  Child in 67. Apparently EF dropped to 15-20% for a period. They suggested ICD but she refused. She returned to [place] earlier this year and saw Dr. Antoine Poche. Was out of medicines for about about 1 month due to lack of insurance.   Echo in May EF 20-25% severe posterior MR RV normal.   Patient returns today for pharmacist-led HF medication titration. At last HF clinic visit on 7/24, her lisinopril was switched to Entresto 24-26 mg BID. Says she feels ok. Can get around but says she gets hot quick and sweats a lot. Not planning on having any more children. Has Implanon progesterone implant. Does not have transportation. Annice Pih, CSW, helping with Medicaid transport. Cooks most of her meals. Careful with salt. She is planning to start exercising. Does admit to sometimes forgetting to take her medications since she is busy with her 5 children at home.     Marland Kitchen Shortness of breath/dyspnea on exertion? No  . Orthopnea/PND? Yes - 3 pillows stable . Edema? no . Lightheadedness/dizziness? Yes - sometimes when stands up too quickly or bending over . Daily weights at home? no . Blood pressure/heart rate monitoring at home? no . Following low-sodium/fluid-restricted diet? Yes - eating more seafood/vegetarian  HF Medications: Metoprolol succinate Sarah mg PO daily Entresto 49-51 mg PO BID  Has the patient been experiencing any side effects to the medications prescribed?  no  Does the  patient have any problems obtaining medications due to transportation or finances?   No - [place] Medicaid  Understanding of regimen: good Understanding of indications: good Potential of compliance: fair Patient understands to avoid NSAIDs. Patient understands to avoid decongestants.    Pertinent Lab Values: . 02/06/18: Serum creatinine 0.78, BUN 13, Potassium 4.6, Sodium 140  Vital Signs: . Weight: 233 lb (dry weight: 233 lb) . Blood pressure: 124/90 mmHg  . Heart rate: 82 bpm  Assessment: 1. Chronicsystolic CHF (EF 17-61%), due to NICM (?peripartum). NYHA class II-IIIsymptoms. - Volume status stable - Start spironolactone 12.5 mg daily  - Continue metoprolol succinate Sarah mg daily and Entresto 49-51 mg BID - Will eventually need cMRI and CPX - Repeat echo in 3 months after aggressive med titration. If EF still <= 35% will need ICD - Basic disease state pathophysiology, medication indication, mechanism and side effects reviewed at length with patient and he verbalized understanding  2. Severe mitral regurgitation - this is functional MR.  - hopefully will improve with treatment of HF  3. Heavy snoring/probable OSA - Refered for sleep study at last visit  4. Social issues - Referred to HF SW    Plan: 1) Medication changes: Based on clinical presentation, vital signs and recent labs will start spironolactone 12.5 mg daily 2) Labs: BMET today  3) Follow-up: Pharmacy visit on 8/28 and 9/11, Dr. Gala Romney on 10/24   Tyler Deis. Bonnye Fava, PharmD, BCPS, CPP Clinical Pharmacist Pager: [phone] Phone: [phone] 02/06/2018 9:19 AM

## 2018-02-06 NOTE — Progress Notes (Signed)
CSW met with patient in the clinic. Patient needs assistance with transportation. She has a pending SCAT application and CSW provided information on medicaid transport. Patient has an appointment this coming Thursday at the sleep center as CSW will assist with taxi ride as the appointment is in the evening and no other options avaialble. CSW continues to be available as needed. Raquel Sarna, [place], [place]

## 2018-02-06 NOTE — Patient Instructions (Signed)
It was great to meet you today!  Please START spironolactone 12.5 mg (1/2 tablet) DAILY AT BEDTIME.   You are scheduled to see the pharmacist again on Wednesday, 8/28.

## 2018-02-08 ENCOUNTER — Ambulatory Visit (HOSPITAL_BASED_OUTPATIENT_CLINIC_OR_DEPARTMENT_OTHER): Payer: Medicaid Other | Attending: Internal Medicine | Admitting: Cardiovascular Disease

## 2018-02-08 ENCOUNTER — Telehealth (HOSPITAL_COMMUNITY): Payer: Self-pay | Admitting: Pharmacist

## 2018-02-08 VITALS — Ht 67.0 in | Wt 232.0 lb

## 2018-02-08 DIAGNOSIS — G4719 Other hypersomnia: Secondary | ICD-10-CM

## 2018-02-08 DIAGNOSIS — Z79899 Other long term (current) drug therapy: Secondary | ICD-10-CM | POA: Insufficient documentation

## 2018-02-08 DIAGNOSIS — G4710 Hypersomnia, unspecified: Secondary | ICD-10-CM | POA: Insufficient documentation

## 2018-02-08 DIAGNOSIS — R0683 Snoring: Secondary | ICD-10-CM | POA: Diagnosis not present

## 2018-02-08 DIAGNOSIS — R5383 Other fatigue: Secondary | ICD-10-CM | POA: Insufficient documentation

## 2018-02-08 NOTE — Telephone Encounter (Signed)
Entresto PA approved by [place] Medicaid through 02/02/19.   Tyler Deis. Bonnye Fava, PharmD, BCPS, CPP Clinical Pharmacist Phone: [phone] 02/08/2018 9:52 AM

## 2018-02-17 ENCOUNTER — Encounter (HOSPITAL_BASED_OUTPATIENT_CLINIC_OR_DEPARTMENT_OTHER): Payer: Self-pay | Admitting: Cardiovascular Disease

## 2018-02-17 NOTE — Procedures (Signed)
Patient Name: Sarah Reilly, Sarah Reilly Date: 02/08/2018 Gender: Female D.O.B: 1980/08/17 Age (years): 37 Referring Provider: [name] [name] Height (inches): 67 Interpreting Physician: Nicki Guadalajara MD, ABSM Weight (lbs): 232 RPSGT: Armen Pickup BMI: 36 MRN: [id number] Neck Size: 15.00  CLINICAL INFORMATION Sleep Study Type: NPSG  Indication for sleep study: Fatigue, Snoring  Epworth Sleepiness Score: 14  SLEEP STUDY TECHNIQUE As per the AASM Manual for the Scoring of Sleep and Associated Events v2.3 (April 2016) with a hypopnea requiring 4% desaturations.  The channels recorded and monitored were frontal, central and occipital EEG, electrooculogram (EOG), submentalis EMG (chin), nasal and oral airflow, thoracic and abdominal wall motion, anterior tibialis EMG, snore microphone, electrocardiogram, and pulse oximetry.  MEDICATIONS     aspirin-acetaminophen-caffeine (EXCEDRIN MIGRAINE) 250-250-65 MG tablet             metoprolol succinate (TOPROL-XL) 50 MG 24 hr tablet         sacubitril-valsartan (ENTRESTO) 49-51 MG         spironolactone (ALDACTONE) 25 MG tablet      Medications self-administered by patient taken the night of the study : N/A  SLEEP ARCHITECTURE The study was initiated at 10:37:15 PM and ended at 4:36:01 AM.  Sleep onset time was 64.4 minutes and the sleep efficiency was 77.8%%. The total sleep time was 279 minutes.  Stage REM latency was 98.5 minutes.  The patient spent 2.7%% of the night in stage N1 sleep, 72.8%% in stage N2 sleep, 0.2%% in stage N3 and 24.4% in REM.  Alpha intrusion was absent.  Supine sleep was 32.08%.  RESPIRATORY PARAMETERS The overall apnea/hypopnea index (AHI) was 0.6 per hour.  The respiratory disturbance index ( RDI) was 1.1/h. There were 0 total apneas, including 0 obstructive, 0 central and 0 mixed apneas. There were 3 hypopneas and 2 RERAs.  The AHI during Stage REM sleep was 0.9 per hour.  AHI while supine  was 0.7 per hour.  The mean oxygen saturation was 96.9%. The minimum SpO2 during sleep was 94.0%.  Soft intermittent snoring was noted during this study.  CARDIAC DATA The 2 lead EKG demonstrated sinus rhythm. The mean heart rate was 72.0 beats per minute. Other EKG findings include: None.  LEG MOVEMENT DATA The total PLMS were 0 with a resulting PLMS index of 0.0. Associated arousal with leg movement index was 0.0 .  IMPRESSIONS - No significant obstructive sleep apnea occurred during this study (AHI 0.6/h; RDI 1.1/h). - No significant central sleep apnea occurred during this study (CAI = 0.0/h). - No oxygen desaturation during the study (Min O2 94.0%). - The patient snored intermittently with soft snoring volume. - Prolonged sleep latency at 64.4 minutes. - The arousal index was increased. - No cardiac abnormalities were noted during this study. - Clinically significant periodic limb movements did not occur during sleep. No significant associated arousals.  DIAGNOSIS - Excessive Daytime Sleepiness - Snoring  RECOMMENDATIONS - At present, there is no indication for CPAP therapy.  - Avoid alcohol, sedatives and other CNS depressants that may worsen sleep apnea and disrupt normal sleep architecture. - Sleep hygiene should be reviewed to assess factors that may improve sleep quality. - If patient has continued difficulty with sleep initiation consider a sleep aid.  - If continued daytime sleepiness persists with good sleep hygiene and adequate sleep duration consider an evaluation for idiopathic hypersomnolence or narcolepsy with a multiple latency sleep test.  - Weight management (BMI 36) and regular exercise should be initiated  or continued if appropriate.  [Electronically signed] 02/17/2018 01:05 PM  Nicki Guadalajara MD, [facility], ABSM Diplomate, American Board of Sleep Medicine   NPI: [id number] [facility] PH: [phone]   FX: [phone] ACCREDITED  BY THE AMERICAN ACADEMY OF SLEEP MEDICINE

## 2018-02-20 ENCOUNTER — Telehealth: Payer: Self-pay | Admitting: *Deleted

## 2018-02-20 NOTE — Telephone Encounter (Signed)
-----   Message from Lennette Bihari, MD sent at 02/17/2018  1:11 PM EDT ----- Sarah Reilly, please notify pt of results.  No evidence for sleep apnea.

## 2018-02-20 NOTE — Telephone Encounter (Signed)
Informed patient of sleep study results and patient understanding was verbalized. Patient understands her sleep study showed No evidence of sleep apnea. Pt is aware and agreeable to normal results.

## 2018-02-21 ENCOUNTER — Inpatient Hospital Stay (HOSPITAL_COMMUNITY)
Admission: RE | Admit: 2018-02-21 | Discharge: 2018-02-21 | Disposition: A | Discharge status: Home / Self Care | Payer: Medicaid Other | Source: Ambulatory Visit

## 2018-03-07 ENCOUNTER — Ambulatory Visit (HOSPITAL_COMMUNITY): Payer: Medicaid Other

## 2018-03-07 ENCOUNTER — Telehealth (HOSPITAL_COMMUNITY): Payer: Self-pay | Admitting: Pharmacist

## 2018-03-07 NOTE — Telephone Encounter (Signed)
Patient called stating she won't be able to make her appointment with me today. She states that she had to stop taking Entresto and spironolactone because they were causing too much stomach pain/nausea. She is still taking metoprolol and went back to taking lisinopril 10 mg daily. I have rescheduled her with me next week to discuss these changes.   Tyler Deis. Bonnye Fava, PharmD, BCPS, CPP Clinical Pharmacist Phone: [phone] 03/07/2018 10:05 AM

## 2018-03-15 ENCOUNTER — Ambulatory Visit (HOSPITAL_COMMUNITY)
Admission: RE | Admit: 2018-03-15 | Discharge: 2018-03-15 | Disposition: A | Discharge status: Home / Self Care | Payer: Medicaid Other | Source: Ambulatory Visit | Attending: Internal Medicine | Admitting: Internal Medicine

## 2018-03-15 VITALS — BP 138/92 | HR 78 | Wt 235.0 lb

## 2018-03-15 DIAGNOSIS — R002 Palpitations: Secondary | ICD-10-CM

## 2018-03-15 DIAGNOSIS — O903 Peripartum cardiomyopathy: Secondary | ICD-10-CM

## 2018-03-15 DIAGNOSIS — R0683 Snoring: Secondary | ICD-10-CM | POA: Insufficient documentation

## 2018-03-15 DIAGNOSIS — Z79899 Other long term (current) drug therapy: Secondary | ICD-10-CM | POA: Diagnosis not present

## 2018-03-15 DIAGNOSIS — R079 Chest pain, unspecified: Secondary | ICD-10-CM

## 2018-03-15 DIAGNOSIS — E669 Obesity, unspecified: Secondary | ICD-10-CM | POA: Insufficient documentation

## 2018-03-15 DIAGNOSIS — I5022 Chronic systolic (congestive) heart failure: Secondary | ICD-10-CM | POA: Insufficient documentation

## 2018-03-15 DIAGNOSIS — F419 Anxiety disorder, unspecified: Secondary | ICD-10-CM | POA: Diagnosis not present

## 2018-03-15 DIAGNOSIS — I340 Nonrheumatic mitral (valve) insufficiency: Secondary | ICD-10-CM | POA: Diagnosis not present

## 2018-03-15 DIAGNOSIS — I428 Other cardiomyopathies: Secondary | ICD-10-CM | POA: Diagnosis not present

## 2018-03-15 LAB — BASIC METABOLIC PANEL
Anion gap: 9 (ref 5–15)
BUN: 14 mg/dL (ref 6–20)
CO2: 19 mmol/L — ABNORMAL LOW (ref 22–32)
CREATININE: 0.78 mg/dL (ref 0.44–1.00)
Calcium: 9.1 mg/dL (ref 8.9–10.3)
Chloride: 108 mmol/L (ref 98–111)
GFR calc Af Amer: >60 mL/min (ref >60)
GLUCOSE: 98 mg/dL (ref 70–99)
Potassium: 3.8 mmol/L (ref 3.5–5.1)
SODIUM: 136 mmol/L (ref 135–145)

## 2018-03-15 LAB — BRAIN NATRIURETIC PEPTIDE: B NATRIURETIC PEPTIDE 5: 255.9 pg/mL — AB (ref 0.0–100.0)

## 2018-03-15 NOTE — Progress Notes (Signed)
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 Antoine Poche for further management of HF and consideration of advanced therapies.  She developed HF after the birth of her 4th child in 2010. She was in [place] at the time. Saw Dr. Antoine Poche EF 20%. Did not have a heart cath.She then [facility] to be by her mother. She was followed at [facility] Dr. Fanny Bien. She had a high-risk pregnancy andhad her 5th Child in 56. Apparently EF dropped to 15-20% for a period. They suggested ICD but she refused. She returned to [place] earlier this year and saw Dr. Antoine Poche. Was out of medicines for about about 1 month due to lack of insurance.   Echo in May EF 20-25% severe posterior MR RV normal.   Patient returns today for pharmacist-led HF medication titration. At last pharmacy HF clinic visit on 8/13, she was started on spironolactone 12.5 mg daily.Since then she states that she had to stop taking Entresto and spironolactone because they were causing too much stomach pain/nausea. She is still taking metoprolol and went back to taking lisinopril 10 mg daily. Says she feels nauseous all the time and for the past few days has been having dizziness, palpitations and chest pain off and on. She does admit to having severe anxiety and agoraphobia but doesn't think these symptoms are associated with her anxiety.    Shortness of breath/dyspnea on exertion?No  Orthopnea/PND?Yes - 3 pillows stable  Edema?no  Lightheadedness/dizziness?Yes- sometimes when stands up too quickly or bending over  Daily weights at home?no  Blood pressure/heart rate monitoring at home?no  Following low-sodium/fluid-restricteddiet? Yes- eating more seafood/vegetarian   HF Medications: Lisinopril 10 mg PO daily in the evening Metoprolol succinate 50 mg PO daily  Has the patient been experiencing any  side effects to the medications prescribed?  no  Does the patient have any problems obtaining medications due to transportation or finances?   No - [place] Medicaid  Understanding of regimen: fair Understanding of indications: fair Potential of compliance: fair Patient understands to avoid NSAIDs. Patient understands to avoid decongestants.   Pertinent Lab Values:  03/15/18: Serum creatinine 0.78, BUN 14, Potassium 3.8, Sodium 136, BNP 255  Vital Signs:  Weight: 235 lb (dry weight: 232-233 lb)  Blood pressure: 138/92 mmHg   Heart rate: 78 bpm    Assessment: 1. 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 NYHA class IIIsymptoms.  - Volume status stable  - EKG with evidence of LVH, no ischemia  - With current dizziness, will continue current regimen of metoprolol succinate 50 mg daily and lisinopril 10 mg daily  - Will eventually need cMRI and CPX  - Repeat echo in 3 months after aggressive med titration. If EF still <= 35% will need ICD - Basic disease state pathophysiology, medication indication, mechanism and side effects reviewed at length with patient and she verbalized understanding  2. Severe mitral regurgitation - this is functional MR.  - hopefully will improve with treatment of HF  3. Heavy snoring/probable OSA - Referedfor sleep study at last visit  4. Anxiety - Advised patient to see PCP to evaluate and treat appropriately   Plan: 1) Medication changes: Based on clinical presentation, vital signs and recent labs will continue current regimen 2) Labs: BMET and BNP today 3) Follow-up: Dr. Gala Romney on 10/24   Tyler Deis. Bonnye Fava, PharmD, BCPS, CPP Clinical Pharmacist Phone: [phone] 02/21/2018 9:03  AM

## 2018-03-15 NOTE — Patient Instructions (Addendum)
Please continue your current regimen.   Blood work today. We will call you with any changes.   Please keep your appointment with Dr. Gala Romney on 04/19/18.

## 2018-04-19 ENCOUNTER — Encounter (HOSPITAL_COMMUNITY): Payer: Medicaid Other | Admitting: Internal Medicine

## 2018-07-13 ENCOUNTER — Inpatient Hospital Stay (HOSPITAL_COMMUNITY): Admission: RE | Admit: 2018-07-13 | Payer: Medicaid Other | Source: Ambulatory Visit | Admitting: Internal Medicine

## 2018-11-28 ENCOUNTER — Other Ambulatory Visit: Payer: Self-pay

## 2018-11-28 ENCOUNTER — Emergency Department (HOSPITAL_COMMUNITY): Payer: Medicaid Other

## 2018-11-28 ENCOUNTER — Emergency Department (HOSPITAL_COMMUNITY)
Admission: EM | Admit: 2018-11-28 | Discharge: 2018-11-28 | Discharge status: Against Medical Advice | Payer: Medicaid Other | Attending: Emergency Medicine | Admitting: Emergency Medicine

## 2018-11-28 ENCOUNTER — Telehealth (HOSPITAL_COMMUNITY): Payer: Self-pay | Admitting: Cardiology

## 2018-11-28 ENCOUNTER — Encounter (HOSPITAL_COMMUNITY): Payer: Self-pay | Admitting: Emergency Medicine

## 2018-11-28 DIAGNOSIS — R000 Tachycardia, unspecified: Secondary | ICD-10-CM | POA: Diagnosis not present

## 2018-11-28 DIAGNOSIS — R079 Chest pain, unspecified: Secondary | ICD-10-CM | POA: Diagnosis not present

## 2018-11-28 DIAGNOSIS — Z87891 Personal history of nicotine dependence: Secondary | ICD-10-CM | POA: Insufficient documentation

## 2018-11-28 DIAGNOSIS — R0789 Other chest pain: Secondary | ICD-10-CM | POA: Diagnosis not present

## 2018-11-28 DIAGNOSIS — Z79899 Other long term (current) drug therapy: Secondary | ICD-10-CM | POA: Insufficient documentation

## 2018-11-28 DIAGNOSIS — Z5320 Procedure and treatment not carried out because of patient's decision for unspecified reasons: Secondary | ICD-10-CM | POA: Insufficient documentation

## 2018-11-28 DIAGNOSIS — I10 Essential (primary) hypertension: Secondary | ICD-10-CM | POA: Insufficient documentation

## 2018-11-28 DIAGNOSIS — Z20828 Contact with and (suspected) exposure to other viral communicable diseases: Secondary | ICD-10-CM | POA: Diagnosis not present

## 2018-11-28 DIAGNOSIS — J9811 Atelectasis: Secondary | ICD-10-CM | POA: Diagnosis not present

## 2018-11-28 LAB — BASIC METABOLIC PANEL
Anion gap: 12 (ref 5–15)
BUN: 14 mg/dL (ref 6–20)
CO2: 18 mmol/L — ABNORMAL LOW (ref 22–32)
Calcium: 9.5 mg/dL (ref 8.9–10.3)
Chloride: 106 mmol/L (ref 98–111)
Creatinine, Ser: 0.45 mg/dL (ref 0.44–1.00)
GFR calc Af Amer: >60 mL/min (ref >60)
GFR calc non Af Amer: >60 mL/min (ref >60)
Glucose, Bld: 123 mg/dL — ABNORMAL HIGH (ref 70–99)
Potassium: 4 mmol/L (ref 3.5–5.1)
Sodium: 136 mmol/L (ref 135–145)

## 2018-11-28 LAB — CBC
HCT: 38.6 % (ref 36.0–46.0)
Hemoglobin: 11.9 g/dL — ABNORMAL LOW (ref 12.0–15.0)
MCH: 24.7 pg — ABNORMAL LOW (ref 26.0–34.0)
MCHC: 30.8 g/dL (ref 30.0–36.0)
MCV: 80.2 fL (ref 80.0–100.0)
Platelets: 187 10*3/uL (ref 150–400)
RBC: 4.81 MIL/uL (ref 3.87–5.11)
RDW: 11.6 % (ref 11.5–15.5)
WBC: 3.7 10*3/uL — ABNORMAL LOW (ref 4.0–10.5)
nRBC: 0 % (ref 0.0–0.2)

## 2018-11-28 LAB — D-DIMER, QUANTITATIVE: D-Dimer, Quant: 1.15 ug/mL-FEU — ABNORMAL HIGH (ref 0.00–0.50)

## 2018-11-28 LAB — TROPONIN I: Troponin I: 0.08 ng/mL (ref <0.03)

## 2018-11-28 LAB — I-STAT BETA HCG BLOOD, ED (MC, WL, AP ONLY): I-stat hCG, quantitative: <5 m[IU]/mL (ref <5)

## 2018-11-28 LAB — HCG, SERUM, QUALITATIVE: Preg, Serum: NEGATIVE

## 2018-11-28 MED ORDER — METOPROLOL TARTRATE 5 MG/5ML IV SOLN
5.0000 mg | Freq: Once | INTRAVENOUS | Status: AC
  Administered 2018-11-28: 5 mg via INTRAVENOUS
  Filled 2018-11-28: qty 5

## 2018-11-28 MED ORDER — SODIUM CHLORIDE 0.9% FLUSH: 3.0000 mL | Freq: Once | INTRAVENOUS | Status: DC

## 2018-11-28 MED ORDER — IOHEXOL 350 MG/ML SOLN
100.0000 mL | Freq: Once | INTRAVENOUS | Status: AC | PRN
  Administered 2018-11-28: 100 mL via INTRAVENOUS

## 2018-11-28 MED ORDER — OXYCODONE-ACETAMINOPHEN 5-325 MG PO TABS
2.0000 | ORAL_TABLET | Freq: Once | ORAL | Status: AC
  Administered 2018-11-28: 2 via ORAL
  Filled 2018-11-28: qty 2

## 2018-11-28 NOTE — ED Notes (Signed)
This RN spoke with EDP who stated pt still refusing Covid test. Pt stating she does not want to stay. Pt aware troponin is elevated and heart rate is still high. Pt states "I really just need the pain to go away. I don't think the heart rate will ever change. I do not want to stay either way." Per EDP, pt to leave AMA. Pt signed AMA paperwork on computer. Pt aware she may return at any point if she changes her mind. Pt alert, oriented, and ambulatory at this time.

## 2018-11-28 NOTE — ED Provider Notes (Cosign Needed)
ED ECG REPORT   Date: 11/28/2018  Rate: 116  Rhythm: sinus tachycardia  QRS Axis: normal  Intervals: normal  ST/T Wave abnormalities: nonspecific ST/T changes  Conduction Disutrbances:none  Narrative Interpretation: similar inferior ST depressions inferiorly compared to prior  Old EKG Reviewed: unchanged  I have personally reviewed the EKG tracing and agree with the computerized printout as noted.    Renne Crigler, PA-C 11/28/18 1630

## 2018-11-28 NOTE — ED Notes (Signed)
CT notified pt ready for transport

## 2018-11-28 NOTE — ED Notes (Signed)
RN attempted to collect coronavirus test. Pt stating "there is no way I could be positive, I haven't left the house and two months neither has anyone in my house. I'm agoraphobic. I have mold spores in my house that's what they need to test me for not this crap." RN to notify EDP.

## 2018-11-28 NOTE — ED Notes (Signed)
ED Provider at bedside. 

## 2018-11-28 NOTE — Telephone Encounter (Signed)
Agreed needs to be seen in the ER ASAP.   Karolynn Infantino Np-C  11:39 AM

## 2018-11-28 NOTE — ED Notes (Signed)
Patient transported to X-ray 

## 2018-11-28 NOTE — ED Notes (Signed)
EDP notified of troponin of 0.08

## 2018-11-28 NOTE — ED Provider Notes (Addendum)
[facility] EMERGENCY DEPARTMENT Provider Note   CSN: [id number] Arrival date & time: 11/28/18  1507    History   Chief Complaint Chief Complaint  Patient presents with  . Chest Pain    HPI Sarah Reilly is a 38 y.o. female.     Patient is a 38 year old female with past medical history of postpartum cardiomyopathy, hypertension, and migraines.  She presents today for evaluation of chest discomfort.  She describes a tightness in her chest that has been present for the past 3 days.  She feels somewhat short of breath, but denies any nausea.  She states the pain radiates to her jaw and back.  She is somewhat tachycardic in triage and reports not taking her metoprolol this morning.  The history is provided by the patient.  Chest Pain  Pain location:  Substernal area Pain quality: tightness   Pain radiates to:  L jaw Pain severity:  Moderate Onset quality:  Gradual Duration:  3 days Timing:  Constant Progression:  Worsening Chronicity:  New Relieved by:  Nothing Worsened by:  Nothing   Past Medical History:  Diagnosis Date  . Cardiomyopathy   . HTN (hypertension)   . Mitral regurgitation     Patient Active Problem List   Diagnosis Date Noted  . Medication management 11/05/2017  . Other fatigue 11/05/2017  . 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 SOB (shortness of breath) 11/03/2017  . MITRAL REGURGITATION 07/20/2009  . CHEST PAIN 07/20/2009  . HYPERTENSION, BENIGN 06/16/2009  . OTHER PRIMARY CARDIOMYOPATHIES 05/12/2009  . AGORAPHOBIA 04/22/2009  . MIGRAINE HEADACHE 04/22/2009  . PYELONEPHRITIS 04/22/2009  . SYNCOPE 04/22/2009  . DIZZINESS 04/22/2009  . PALPITATIONS 04/22/2009  . ELECTROCARDIOGRAM, ABNORMAL 04/22/2009    History reviewed. No pertinent surgical history.   OB History   No obstetric history on file.      Home Medications    Prior to Admission medications   Medication Sig Start Date End Date Taking?  Authorizing Provider  aspirin-acetaminophen-caffeine (EXCEDRIN MIGRAINE) [phone] MG tablet Take 2 tablets by mouth every 6 (six) hours as needed for headache.    [provider]  lisinopril (PRINIVIL,ZESTRIL) 10 MG tablet Take 10 mg by mouth daily.    [provider]  metoprolol succinate (TOPROL-XL) 50 MG 24 hr tablet Take 1 tablet (50 mg total) by mouth daily. Take with or immediately following a meal. 11/03/17 02/07/19  Rollene Rotunda, MD    Family History Family History  Problem Relation Age of Onset  . Heart disease Mother        No details  . Hypertension Mother   . Diabetes Mother   . Hypertension Father   . Heart murmur Sister     Social History Social History   Tobacco Use  . Smoking status: Former Games developer  . Smokeless tobacco: Never Used  Substance Use Topics  . Alcohol use: Yes    Alcohol/week: 1.0 standard drinks    Types: 1 Glasses of wine per week  . Drug use: Never     Allergies   Celery oil; Shellfish allergy; and Hydrocodone   Review of Systems Review of Systems  Cardiovascular: Positive for chest pain.  All other systems reviewed and are negative.    Physical Exam Updated Vital Signs BP (!) 148/102   Pulse (!) 133   Temp 98 F (36.7 C) (Oral)   Resp 18   SpO2 96%   Physical Exam Vitals signs and nursing note reviewed.  Constitutional:  General: She is not in acute distress.    Appearance: She is well-developed. She is not diaphoretic.  HENT:     Head: Normocephalic and atraumatic.  Neck:     Musculoskeletal: Normal range of motion and neck supple.  Cardiovascular:     Rate and Rhythm: Regular rhythm. Tachycardia present.     Heart sounds: No murmur. No friction rub. No gallop.   Pulmonary:     Effort: Pulmonary effort is normal. No respiratory distress.     Breath sounds: Normal breath sounds. No wheezing.  Abdominal:     General: Bowel sounds are normal. There is no distension.     Palpations: Abdomen is  soft.     Tenderness: There is no abdominal tenderness.  Musculoskeletal: Normal range of motion.     Right lower leg: She exhibits no tenderness. No edema.     Left lower leg: She exhibits no tenderness. No edema.  Skin:    General: Skin is warm and dry.     Comments: Homans sign is absent bilaterally.  Neurological:     Mental Status: She is alert and oriented to person, place, and time.      ED Treatments / Results  Labs (all labs ordered are listed, but only abnormal results are displayed) Labs Reviewed  BASIC METABOLIC PANEL  CBC  TROPONIN I  I-STAT BETA HCG BLOOD, ED ([facility], [facility], [facility] ONLY)    EKG EKG Interpretation  Date/Time:  Wednesday November 28 2018 15:19:06 EDT Ventricular Rate:  142 PR Interval:  182 QRS Duration: 80 QT Interval:  284 QTC Calculation: 436 R Axis:   51 Text Interpretation:  Sinus tachycardia Possible Left atrial enlargement Left ventricular hypertrophy with repolarization abnormality Abnormal ECG Confirmed by Geoffery Lyons ([id number]) on 11/28/2018 3:28:17 PM   Radiology No results found.  Procedures Procedures (including critical care time)  Medications Ordered in ED Medications  sodium chloride flush (NS) 0.9 % injection 3 mL (3 mLs Intravenous Not Given 11/28/18 1528)  metoprolol tartrate (LOPRESSOR) injection 5 mg (has no administration in time range)     Initial Impression / Assessment and Plan / ED Course  I have reviewed the triage vital signs and the nursing notes.  Pertinent labs & imaging results that were available during my care of the patient were reviewed by me and considered in my medical decision making (see chart for details).  Patient is a 38 year old female with postpartum cardiomyopathy presenting with complaints of tachycardia and chest pressure for the past several days.  Upon presentation, her heart rate was in the 150s and appeared to be a sinus tachycardia.  Patient was given IV Lopressor with some improvement in her heart  rate.  Work-up was initiated including CBC, basic, troponin, d-dimer.  D-dimer was elevated at 1.15 and troponin mildly elevated at 0.08, likely related to demand.  Other tests were basically normal.  There were no other changes on her EKG.  Due to the positive d-dimer and tachycardia, a CT scan was obtained to rule out the possibility of pulmonary embolism.  The test was technically limited, however showed no evidence for large PE.  There was the suggestion of scattered, nonspecific infectious versus inflammatory opacities bilaterally which could be related to atypical infection, specifically mentioned was the possibility of COVID-19.  At this point, I felt as though the patient required admission for further work-up of her tachycardia.  A COVID swab was ordered, however the patient refused this stating that she had not left her  house in 2 months and was absolutely certain this was not what she had.  I explained to her of my intention to admit her for further workup of her tachycardia and troponin, and to rule out Covid as the cause of her chest discomfort and tachycardia.  Patient then insisted she be tested for "mold spores" stating that she has mold in her apartment and is certain that this is what is causing her issues.  I explained to her that there was no specific test nor was there a specific treatment for mold spores other than a change of her living environment.  She said she understood this, but was not in a position where she could make this change.  Patient is now insisting upon being discharged and is requesting to leave AMA.  She appears to have decision-making capacity to refuse care and leave.  She understands that this could cause death or serious disability and is willing to accept these risks.  She also demanded medication for her pain.  Patient will be given Percocet here in the ER, then can take ibuprofen or Tylenol as needed at home.  As the patient is signing out AGAINST MEDICAL  ADVICE, I will not be prescribing her additional pain medication so as to not mask a potentially serious issue.  She is not happy about not receiving pain medication.    Final Clinical Impressions(s) / ED Diagnoses   Final diagnoses:  None    ED Discharge Orders    None       Geoffery Lyons, MD 11/28/18 0923    Geoffery Lyons, MD 11/28/18 3007

## 2018-11-28 NOTE — ED Notes (Signed)
Pt placed on 2L [place] for comfort. Pt 99% on RA.

## 2018-11-28 NOTE — ED Triage Notes (Signed)
Pt c/o intermittent chest pressure and burning that radiates to her jaw and back x "days". Hx heart failure. Pt tachycardic in triage, HR 150s.

## 2018-11-28 NOTE — Telephone Encounter (Signed)
Patient left voicemail on triage line requesting OTC medication for chest pain/gas  Reports she has been having gas/chest pains  +headache, +SOB, +dizziness, +arm pain/numbness +jaw pain, +chest pains during phone call  Advised she should report to the ER for further evaluation of active chest pains  Reports she does not have transportation, patient was advised to call 911, EMS evaluation ASAP and ambulance transportation to ER   Message to provider as Lorain Childes

## 2018-12-07 ENCOUNTER — Telehealth (HOSPITAL_COMMUNITY): Payer: Self-pay | Admitting: Cardiology

## 2018-12-07 NOTE — Telephone Encounter (Signed)
-  request an appointment for follow up chest pains  Advised if patient was still having chest pains, she should report to ER for further work up  Patient left ER  AMA at 11/28/18

## 2018-12-11 ENCOUNTER — Telehealth (INDEPENDENT_AMBULATORY_CARE_PROVIDER_SITE_OTHER): Payer: Medicaid Other | Admitting: Adult Health

## 2018-12-11 VITALS — Ht 67.0 in | Wt 260.0 lb

## 2018-12-11 DIAGNOSIS — I10 Essential (primary) hypertension: Secondary | ICD-10-CM

## 2018-12-11 DIAGNOSIS — O903 Peripartum cardiomyopathy: Secondary | ICD-10-CM

## 2018-12-11 DIAGNOSIS — R079 Chest pain, unspecified: Secondary | ICD-10-CM

## 2018-12-11 DIAGNOSIS — K219 Gastro-esophageal reflux disease without esophagitis: Secondary | ICD-10-CM

## 2018-12-11 MED ORDER — PANTOPRAZOLE SODIUM 40 MG PO TBEC: 40.0000 mg | DELAYED_RELEASE_TABLET | Freq: Every day | ORAL | 11 refills | Status: DC

## 2018-12-11 MED ORDER — NITROGLYCERIN 0.4 MG SL SUBL: 0.4000 mg | SUBLINGUAL_TABLET | SUBLINGUAL | 3 refills | Status: DC | PRN

## 2018-12-11 MED ORDER — METOPROLOL SUCCINATE ER 50 MG PO TB24: ORAL_TABLET | ORAL | 3 refills | Status: DC

## 2018-12-11 MED ORDER — SIMETHICONE 80 MG PO TABS: 2.0000 | ORAL_TABLET | Freq: Three times a day (TID) | ORAL | 3 refills | Status: DC

## 2018-12-11 NOTE — Progress Notes (Signed)
Virtual Visit via Telephone Note   This visit type was conducted due to national recommendations for restrictions regarding the COVID-19 Pandemic (e.g. social distancing) in an effort to limit this patient's exposure and mitigate transmission in our community.  Due to her co-morbid illnesses, this patient is at least at moderate risk for complications without adequate follow up.  This format is felt to be most appropriate for this patient at this time.  The patient did not have access to video technology/had technical difficulties with video requiring transitioning to audio format only (telephone).  All issues noted in this document were discussed and addressed.  No physical exam could be performed with this format.  Please refer to the patient's chart for her  consent to telehealth for [facility].   Date:  12/11/2018   ID:  Sarah Reilly, DOB 1981-05-04, MRN [id number]  Patient Location: Home Provider Location: Home  PCP:  Dolores Patty, MD  Cardiologist: Antoine Poche ACHF: [name] Electrophysiologist:  None   Evaluation Performed:  Follow-Up Visit  Chief Complaint: Chest Pain  History of Present Illness:    Sarah Reilly is a 38 y.o. 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 Is followed in the ACHF clinic and was last seen on 01/17/2018.  She was continued on metoprolol 50 mg daily, and was recommended to have cardiac MRI and CPX with repeat echo in October of 2019.   She was referred to the ED on 11/28/2018 for complaints of chest pain, worsening dyspnea with arm pain, jaw pain and dizziness. She did go to ED but apparently left AMA. CT scan of the chest was negative for PE. She was not found to be anemic, hypokalemic, or dehydrated. EKG revealed sinus tachycardia rate of 144 bpm with LVH.   She states that she is having gas pains  everyday and also waking her up at night. She states usually occurs when she is lying down or in bed. Sitting up makes it feel better. She states that this has occurred over 3 weeks.  No abdominal pain. Mild nausea and diarrhea.   The patient does not have symptoms concerning for COVID-19 infection (fever, chills, cough, or new shortness of breath).    Past Medical History:  Diagnosis Date  . Cardiomyopathy   . HTN (hypertension)   . Mitral regurgitation    No past surgical history on file.   No outpatient medications have been marked as taking for the 12/11/18 encounter (Appointment) with Jodelle Gross, NP.     Allergies:   Celery oil, Shellfish allergy, and Hydrocodone   Social History   Tobacco Use  . Smoking status: Former Games developer  . Smokeless tobacco: Never Used  Substance Use Topics  . Alcohol use: Yes    Alcohol/week: 1.0 standard drinks    Types: 1 Glasses of wine per week  . Drug use: Never     Family Hx: The patient's family history includes Diabetes in her mother; Heart disease in her mother; Heart murmur in her sister; Hypertension in her father and mother.  ROS:   Please see the history of present illness.    All other systems reviewed and are negative.   Prior CV studies:   The following studies were reviewed today:  Echocardiogram 11/16/2017  Left ventricle: The cavity size was severely dilated. Wall   thickness was normal. Systolic function  was severely reduced. The   estimated ejection fraction was in the range of 20% to 25%.   Diffuse hypokinesis. - Mitral valve: There was severe regurgitation. - Left atrium: The atrium was moderately dilated. - Pulmonary arteries: Systolic pressure was mildly increased.  Impressions:  - Severe global reduction in LV systolic function; severe LVE;   severe MR; moderate LAE; mild TR with mild pulmonary   hypertension.   Labs/Other Tests and Data Reviewed:    EKG:  An ECG dated 11/29/2018 was personally  reviewed today and demonstrated:  Sinus tachycardia rate of 144 bpm with LVH.  Recent Labs: 03/15/2018: B Natriuretic Peptide 255.9 11/28/2018: BUN 14; Creatinine, Ser 0.45; Hemoglobin 11.9; Platelets 187; Potassium 4.0; Sodium 136   Recent Lipid Panel No results found for: CHOL, TRIG, HDL, CHOLHDL, LDLCALC, LDLDIRECT  Wt Readings from Last 3 Encounters:  03/15/18 235 lb (106.6 kg)  02/08/18 232 lb (105.2 kg)  02/06/18 233 lb 3.2 oz (105.8 kg)     Objective:    Vital Signs:  LMP 11/14/2018    VITAL SIGNS:  reviewed GEN:  no acute distress NEURO:  alert and oriented x 3, no obvious focal deficit PSYCH:  Anxious  ASSESSMENT & PLAN:    1. Chest Pain: Described as gas pain occurring when she is lying down. Sitting up makes her feel better, she burps and passes gas and symptoms subside. She does have some radiation to her left arm and jaw.   She was ruled out or ACS in the ED but did not stay for full work up with serial troponin (1st troponin 0.08). EKG was unremarkable except for HR elevation. She was ruled out for PE.   I will given her simethicone tablets to take prior to each meal and at bedtime, and a PPI for help with GERD. She did not have Cardiac MR or CPX as recommended by [facility] clinic on last visit.  She will follow up with them for further recommendations.  2. NICM: Last echocardiogram revealed EF of 20%-25% with LVH and severe MR. Will repeat echo for LV function. Recommendation for referral to valvular heart clinic per Dr. Haroldine Laws, with reduced EF, this may not be prudent.   3. Tachycardia: Is on metoprolol succinate 50 mg daily. Will increase to Metoprolol succinate 75 mg. She would like to take it at night. She will be seen on follow up for evaluation of her response to medications.   4. Heartburn and gas: May need Gi evaluation. Symptomatic relief as above  COVID-19 Education: The signs and symptoms of COVID-19 were discussed with the patient and how to seek care for  testing (follow up with PCP or arrange E-visit).  The importance of social distancing was discussed today.  Time:   Today, I have spent 15 minutes with the patient with telehealth technology discussing the above problems.     Medication Adjustments/Labs and Tests Ordered: Current medicines are reviewed at length with the patient today.  Concerns regarding medicines are outlined above.   Tests Ordered: No orders of the defined types were placed in this encounter.   Medication Changes: No orders of the defined types were placed in this encounter.   Disposition:  Follow up 3 months  Signed, Phill Myron. West Pugh, ANP, AACC  12/11/2018 1:15 PM    [facility]

## 2018-12-11 NOTE — Patient Instructions (Addendum)
Medication Instructions:  START PROTONIX 40 MG DAILY  START TAKING SIMETHICONE-2 TABLETS BEFORE MEALS AND AT BEDTIME. IT MAY TAKE A FEW DAYS FOR PROTONIX TO START WORKING.  INCREASE METOPROLOL  75MG  (1-1/2 TAB) AT BEDTIME  START NITROGLYCERIN-UNDER THE TONGUE MAY TAKE EVERY 5 MIN FOR 3 DOSES-MAKE SURE YOU ARE SITTING DOWN.  If you need a refill on your cardiac medications before your next appointment, please call your pharmacy.  Testing/Procedures: Echocardiogram - Your physician has requested that you have an echocardiogram. Echocardiography is a painless test that uses sound waves to create images of your heart. It provides your doctor with information about the size and shape of your heart and how well your heart's chambers and valves are working. This procedure takes approximately one hour. There are no restrictions for this procedure. This will be performed at our [facility] location - [address].  Follow-Up: Your physician recommends that you schedule a follow-up appointment WITH  DR Missy Sabins AFTER ECHO.   At [facility], you and your health needs are our priority.  As part of our continuing mission to provide you with exceptional heart care, we have created designated Provider Care Teams.  These Care Teams include your primary Cardiologist (physician) and Advanced Practice Providers (APPs -  Physician Assistants and Nurse Practitioners) who all work together to provide you with the care you need, when you need it.  Thank you for choosing [facility] at [facility]!!

## 2018-12-19 ENCOUNTER — Telehealth (HOSPITAL_COMMUNITY): Payer: Self-pay | Admitting: *Deleted

## 2018-12-19 NOTE — Telephone Encounter (Signed)
Left message about echo appointment and asked if they could call the office for COVID screening.

## 2018-12-20 ENCOUNTER — Other Ambulatory Visit (HOSPITAL_COMMUNITY): Payer: Medicaid Other

## 2018-12-24 ENCOUNTER — Telehealth: Payer: Self-pay | Admitting: *Deleted

## 2018-12-24 NOTE — Telephone Encounter (Signed)
No appointments available, put on Janan Ridge PA for 7/2 Left message to call back    Should probably follow up in the office if she has increased leg swelling. Either with me or Curt Bears.   ----- Message -----  From: Caprice Beaver, LPN  Sent: 0/37/0488 11:34 AM EDT  To: Minus Breeding, MD, Earvin Hansen, LPN  Subject: FW: Non-Urgent Medical Question           Please advise, message also sent to PharmD regarding medication issues:  This patient has HF with EF 20-25% and this symptoms look more like HF exacerbation, please forward message to cardiologist for recommendation. She was started on Entresto by Dr Seward Grater but not on her medication list anymore.     Her new medications is pantoprazole and simethicone. Simethicone works only on your gut and is not adsorbed by the body therefore , no ADR are expected. Pantoprazole may cause the change in stool color.     Please stop taking pantoprazole for now. Continue simethicone and contact HF clinic for symptoms management.       Any other suggestions?  Thank you!   ----- Message -----  From: [name]  Sent: 12/21/2018 10:25 AM EDT  To: Cv Div Nl Triage  Subject: Non-Urgent Medical Question             Hi,I'm not sure if this is urgent or not but my ankles and legs have been swollen since Tuesday the 23rd and it's a little past my knees to my lower thighs. Is there anything I can do to make the swelling go down besides soaking my feet? And also last night my stool was very pale, could that be from the new medicine you prescribed or is that a bad sign?

## 2018-12-26 ENCOUNTER — Telehealth: Payer: Self-pay | Admitting: Physician Assistant

## 2018-12-26 NOTE — Telephone Encounter (Signed)
LVM, reminding pt of his appt with Hao Meng on 12-27-18.  °

## 2018-12-27 ENCOUNTER — Ambulatory Visit: Payer: Medicaid Other | Admitting: Physician Assistant

## 2018-12-27 ENCOUNTER — Other Ambulatory Visit (HOSPITAL_COMMUNITY): Payer: Medicaid Other

## 2018-12-31 NOTE — Telephone Encounter (Signed)
Patient called back and rescheduled appointment to 7/7 per schedule

## 2019-01-01 ENCOUNTER — Ambulatory Visit (INDEPENDENT_AMBULATORY_CARE_PROVIDER_SITE_OTHER): Payer: Medicaid Other | Admitting: Cardiology

## 2019-01-01 ENCOUNTER — Encounter: Payer: Self-pay | Admitting: Cardiology

## 2019-01-01 ENCOUNTER — Other Ambulatory Visit: Payer: Self-pay

## 2019-01-01 VITALS — BP 114/70 | HR 112 | Temp 97.0°F | Ht 67.0 in | Wt 260.0 lb

## 2019-01-01 DIAGNOSIS — I10 Essential (primary) hypertension: Secondary | ICD-10-CM

## 2019-01-01 DIAGNOSIS — I5023 Acute on chronic systolic (congestive) heart failure: Secondary | ICD-10-CM | POA: Diagnosis not present

## 2019-01-01 DIAGNOSIS — Z833 Family history of diabetes mellitus: Secondary | ICD-10-CM | POA: Diagnosis not present

## 2019-01-01 DIAGNOSIS — R079 Chest pain, unspecified: Secondary | ICD-10-CM | POA: Diagnosis not present

## 2019-01-01 DIAGNOSIS — I340 Nonrheumatic mitral (valve) insufficiency: Secondary | ICD-10-CM | POA: Diagnosis not present

## 2019-01-01 DIAGNOSIS — O903 Peripartum cardiomyopathy: Secondary | ICD-10-CM | POA: Diagnosis not present

## 2019-01-01 DIAGNOSIS — R000 Tachycardia, unspecified: Secondary | ICD-10-CM

## 2019-01-01 MED ORDER — POTASSIUM CHLORIDE CRYS ER 20 MEQ PO TBCR: 20.0000 meq | EXTENDED_RELEASE_TABLET | Freq: Every day | ORAL | 0 refills | Status: DC

## 2019-01-01 MED ORDER — FUROSEMIDE 40 MG PO TABS: 40.0000 mg | ORAL_TABLET | Freq: Every day | ORAL | 0 refills | Status: DC

## 2019-01-01 NOTE — Assessment & Plan Note (Signed)
BMI 40- sleep study Aug 2019 negative for C-pap indication

## 2019-01-01 NOTE — Assessment & Plan Note (Signed)
Controlled.  

## 2019-01-01 NOTE — Assessment & Plan Note (Signed)
Despite BB therapy- check TSH and free T4

## 2019-01-01 NOTE — Patient Instructions (Addendum)
Medication Instructions:  START Lasix 40mg  Take 1 tablet once a day FOR 5 DAYS START Potassium 69meq Take 1 tablet once a day FOR 5 DAYS If you need a refill on your cardiac medications before your next appointment, please call your pharmacy.   Lab work: Your physician recommends that you return for lab work in: TODAY-BMET, BNP, A1C, FREE T4, TSH If you have labs (blood work) drawn today and your tests are completely normal, you will receive your results only by: Marland Kitchen MyChart Message (if you have MyChart) OR . A paper copy in the mail If you have any lab test that is abnormal or we need to change your treatment, we will call you to review the results.  Testing/Procedures: MAKE SURE TO COMPLETE YOUR ECHO, OUR OFFICE WILL CONTACT YOU WITH THE RESULTS  Follow-Up: At [facility], you and your health needs are our priority.  As part of our continuing mission to provide you with exceptional heart care, we have created designated Provider Care Teams.  These Care Teams include your primary Cardiologist (physician) and Advanced Practice Providers (APPs -  Physician Assistants and Nurse Practitioners) who all work together to provide you with the care you need, when you need it. Marland Kitchen PLEASE SCHEDULE PATIENT WITH FIRST AVAILABLE APPT SLOT WITH DR [facility]  Any Other Special Instructions Will Be Listed Below (If Applicable). CONTACT THE OFFICE IF YOU DO NOT FEEL ANY BETTER AFTER TAKING THE LASIX  GO TO THE EMERGENCY ROOM IF YOU HAVE INCREASING CHEST PAIN OR SHORTNESS OF BREATH

## 2019-01-01 NOTE — Progress Notes (Signed)
Cardiology Office Note:    Date:  01/01/2019   ID:  Sarah Reilly, DOB 12/24/1980, MRN [id number]  PCP:  Dolores PattyBensimhon, Daniel R, MD  Cardiologist:  Dr Antoine PocheHochrein Electrophysiologist:  None   Referring MD: Dolores PattyBensimhon, Daniel R, MD   Chief Complaint  Patient presents with  . Follow-up  . Shortness of Breath  . Headache  . Chest Pain  . Edema    History of Present Illness:    Sarah Reilly is a 38 y.o. female with a hx of post partum cardiomyopathy dating back to 2010. [place] was [place] in [place] a time, she declined an ICD.  She saw Dr Antoine PocheHochrein in May 2019 and identifies him as her PCP.  Dr Antoine PocheHochrein referred her to Dr Gala RomneyBensimhon after an echo in May 2019 showed her EF to be 20-25% with severe MR.  Dr Gala RomneyBensimhon recommended aggressive medical Rx and a repeat echo in 3 months, a cMRI and CPX.  The patient could not tolerate Entresto secondary to abdominal pain and was placed on Lisinopril.  Sleep study in Aug 2019 did not suggest she needed C-pap. Her last OV was Sept 2019.  She presented to the ED 11/28/2018 with epigastric pain and left AMA before troponin could be cycled.  "They told me nothing was wrong with my heart". Her initial troponin was 0.08. Her chest CT was not normal-  "scattered, nonspecific infectious or inflammatory subpleural ground-glass pulmonary opacities bilaterally. There are a spectrum of findings in the lungs which can be seen with acute atypical infection (as well as other non-infectious etiologies). In particular, viral pneumonia (including COVID-19) should be considered in the appropriate clinical setting." She was afebrile and had essentially unremarkable BMP and CBC.  Her EKG was remarkable for sinus tachycardia and LVH.   She was seen in f/u virtually on 12/11/2018 and given gaviscon for epigastric pain.  She presents to the office today with complaints of LE edema, chest pain with inspiration, and palpitations with physical exertion. She asked that her boyfriend  accompany her and he was present throughout the interview and exam.  She tells me she went to have her echo earlier this month but was late and they cancelled it.  She says she went home and "passed out"  She did not go back to the ED or seek medical attention. She tells me today she continues to have exertional chest pain and it radiates to her Lt arm and jaw.  She has a chronic cough.  She is unable wear her face mask appropriately because she can't breath if it covers her nose.   Past Medical History:  Diagnosis Date  . Cardiomyopathy   . HTN (hypertension)   . Mitral regurgitation     History reviewed. No pertinent surgical history.  Current Medications: Current Meds  Medication Sig  . aspirin-acetaminophen-caffeine (EXCEDRIN MIGRAINE) 250-250-65 MG tablet Take 2 tablets by mouth every 6 (six) hours as needed for headache.  . lisinopril (PRINIVIL,ZESTRIL) 10 MG tablet Take 10 mg by mouth daily.  . metoprolol succinate (TOPROL-XL) 50 MG 24 hr tablet 1 1/2 TAB AT BEDTIME  . nitroGLYCERIN (NITROSTAT) 0.4 MG SL tablet Place 1 tablet (0.4 mg total) under the tongue every 5 (five) minutes as needed for chest pain. WHILE SITTING  . pantoprazole (PROTONIX) 40 MG tablet Take 1 tablet (40 mg total) by mouth daily.  . Simethicone 80 MG TABS Take 2 tablets (160 mg total) by mouth 4 (four) times daily -  before meals  and at bedtime.     Allergies:   Celery oil, Shellfish allergy, and Hydrocodone   Social History   Socioeconomic History  . Marital status: Single    Spouse name: Not on file  . Number of children: Not on file  . Years of education: Not on file  . Highest education level: Not on file  Occupational History  . Not on file  Social Needs  . Financial resource strain: Not on file  . Food insecurity    Worry: Not on file    Inability: Not on file  . Transportation needs    Medical: Not on file    Non-medical: Not on file  Tobacco Use  . Smoking status: Former Research scientist (life sciences)  .  Smokeless tobacco: Never Used  Substance and Sexual Activity  . Alcohol use: Yes    Alcohol/week: 1.0 standard drinks    Types: 1 Glasses of wine per week  . Drug use: Never  . Sexual activity: Not on file  Lifestyle  . Physical activity    Days per week: Not on file    Minutes per session: Not on file  . Stress: Not on file  Relationships  . Social Herbalist on phone: Not on file    Gets together: Not on file    Attends religious service: Not on file    Active member of club or organization: Not on file    Attends meetings of clubs or organizations: Not on file    Relationship status: Not on file  Other Topics Concern  . Not on file  Social History Narrative   She has 4 children ages 83 and under.  She stays at home. She does not smoke cigarettes or drink alcohol. Does not get regular exercise.      Family History: The patient's family history includes Diabetes in her mother; Heart disease in her mother; Heart murmur in her sister; Hypertension in her father and mother.  ROS:   Please see the history of present illness.     All other systems reviewed and are negative.  EKGs/Labs/Other Studies Reviewed:    The following studies were reviewed today: Echo 11/17/2018- Impressions:  - Severe global reduction in LV systolic function; severe LVE;   severe MR; moderate LAE; mild TR with mild pulmonary   hypertension.   EKG:  EKG is ordered today.  The ekg ordered today demonstrates Sinus tachycardia with LVH and repol changes  Recent Labs: 03/15/2018: B Natriuretic Peptide 255.9 11/28/2018: BUN 14; Creatinine, Ser 0.45; Hemoglobin 11.9; Platelets 187; Potassium 4.0; Sodium 136  Recent Lipid Panel No results found for: CHOL, TRIG, HDL, CHOLHDL, VLDL, LDLCALC, LDLDIRECT  Physical Exam:    VS:  BP 114/70 (BP Location: Left Arm, Patient Position: Sitting, Cuff Size: Large)   Pulse (!) 112   Temp (!) 97 F (36.1 C)   Ht 5\' 7"  (1.702 m)   Wt 260 lb (117.9 kg)    BMI 40.72 kg/m     Wt Readings from Last 3 Encounters:  01/01/19 260 lb (117.9 kg)  12/11/18 260 lb (117.9 kg)  03/15/18 235 lb (106.6 kg)     GEN: Obese female, well developed in no acute distress HEENT: Normal NECK: No JVD; No carotid bruits LYMPHATICS: No lymphadenopathy CARDIAC: RRR, 2/6 MR murmur, no rubs, gallops RESPIRATORY:  Clear to auscultation without rales, wheezing or rhonchi -she was unable to take a deep breath without coughing ABDOMEN: Soft, non-tender, non-distended MUSCULOSKELETAL:  No edema;  No deformity  SKIN: Warm and dry NEUROLOGIC:  Alert and oriented x 3 PSYCHIATRIC:  Normal affect   ASSESSMENT:    CHEST PAIN She complains of daily chest pain with exertion and radiation to her arms and neck. We advised her this would be best evaluated in the ED but she declined at this time.   Essential hypertension Controlled  Severe mitral regurgitation repeat echo to be scheduled  Morbid obesity (HCC) BMI 40- sleep study Aug 2019 negative for C-pap indication  Sinus tachycardia Despite BB therapy- check TSH and free T4  Family history of diabetes mellitus She would like Hgb A1c checked   PLAN:    I discussed this case and reviewed EKG with Dr Cristal Deerhristopher.  We both feel the patient should go to the ED if she has chest pain but at this time the patient declines. "they told me there was nothing wrong with my heart".    I did order 5 days of Lasix 40 mg and K-dur 20 MEQ as I think she is fluid overloaded, that may be contributing to her chest discomfort as well.   We'll check a BMP, BNP, TSH, free T4, and Hgb A1c.  Echo to be rescheduled.  She knows to go to the ED if she has increased chest pain or dyspnea.  F/U with dr Antoine PocheHochrein.   Medication Adjustments/Labs and Tests Ordered: Current medicines are reviewed at length with the patient today.  Concerns regarding medicines are outlined above.  Orders Placed This Encounter  Procedures  . TSH + free T4  .  Basic Metabolic Panel (BMET)  . Pro b natriuretic peptide (BNP)[facility] [place] [facility])  . Hemoglobin A1c  . EKG 12-Lead   Meds ordered this encounter  Medications  . furosemide (LASIX) 40 MG tablet    Sig: Take 1 tablet (40 mg total) by mouth daily.    Dispense:  30 tablet    Refill:  0  . potassium chloride SA (KLOR-CON M20) 20 MEQ tablet    Sig: Take 1 tablet (20 mEq total) by mouth daily.    Dispense:  30 tablet    Refill:  0    Patient Instructions  Medication Instructions:  START Lasix 40mg  Take 1 tablet once a day FOR 5 DAYS START Potassium 20meq Take 1 tablet once a day FOR 5 DAYS If you need a refill on your cardiac medications before your next appointment, please call your pharmacy.   Lab work: Your physician recommends that you return for lab work in: TODAY-BMET, BNP, A1C, FREE T4, TSH If you have labs (blood work) drawn today and your tests are completely normal, you will receive your results only by: Marland Kitchen. MyChart Message (if you have MyChart) OR . A paper copy in the mail If you have any lab test that is abnormal or we need to change your treatment, we will call you to review the results.  Testing/Procedures: MAKE SURE TO COMPLETE YOUR ECHO, OUR OFFICE WILL CONTACT YOU WITH THE RESULTS  Follow-Up: At [facility], you and your health needs are our priority.  As part of our continuing mission to provide you with exceptional heart care, we have created designated Provider Care Teams.  These Care Teams include your primary Cardiologist (physician) and Advanced Practice Providers (APPs -  Physician Assistants and Nurse Practitioners) who all work together to provide you with the care you need, when you need it. Marland Kitchen. PLEASE SCHEDULE PATIENT WITH FIRST AVAILABLE APPT SLOT [facility]  Any  Other Special Instructions Will Be Listed Below (If Applicable). CONTACT THE OFFICE IF YOU DO NOT FEEL ANY BETTER AFTER TAKING THE LASIX  GO TO THE EMERGENCY ROOM IF YOU HAVE  INCREASING CHEST PAIN OR SHORTNESS OF BREATH    Signed, Corine Shelter, PA-C  01/01/2019 2:04 PM    [facility]

## 2019-01-01 NOTE — Assessment & Plan Note (Addendum)
She complains of daily chest pain with exertion and radiation to her arms and neck. We advised her this would be best evaluated in the ED but she declined at this time.

## 2019-01-01 NOTE — Assessment & Plan Note (Signed)
She would like Hgb A1c checked

## 2019-01-01 NOTE — Assessment & Plan Note (Signed)
repeat echo to be scheduled

## 2019-01-02 LAB — HEMOGLOBIN A1C
Est. average glucose Bld gHb Est-mCnc: 97 mg/dL
Hgb A1c MFr Bld: 5 % (ref 4.8–5.6)

## 2019-01-02 LAB — TSH+FREE T4
Free T4: >7.77 ng/dL — ABNORMAL HIGH (ref 0.82–1.77)
TSH: <0.006 u[IU]/mL — ABNORMAL LOW (ref 0.450–4.500)

## 2019-01-02 LAB — BASIC METABOLIC PANEL
BUN/Creatinine Ratio: 29 — ABNORMAL HIGH (ref 9–23)
BUN: 10 mg/dL (ref 6–20)
CO2: 19 mmol/L — ABNORMAL LOW (ref 20–29)
Calcium: 9.6 mg/dL (ref 8.7–10.2)
Chloride: 104 mmol/L (ref 96–106)
Creatinine, Ser: 0.35 mg/dL — ABNORMAL LOW (ref 0.57–1.00)
GFR calc Af Amer: 160 mL/min/{1.73_m2} (ref >59)
GFR calc non Af Amer: 139 mL/min/{1.73_m2} (ref >59)
Glucose: 107 mg/dL — ABNORMAL HIGH (ref 65–99)
Potassium: 4.2 mmol/L (ref 3.5–5.2)
Sodium: 139 mmol/L (ref 134–144)

## 2019-01-02 LAB — PRO B NATRIURETIC PEPTIDE: NT-Pro BNP: 1581 pg/mL — ABNORMAL HIGH (ref 0–130)

## 2019-01-08 ENCOUNTER — Telehealth: Payer: Self-pay

## 2019-01-08 NOTE — Telephone Encounter (Signed)
Left message for patient to contact office on home and cell phone advising her to contact office to discuss elevated lab results.

## 2019-01-09 ENCOUNTER — Ambulatory Visit (HOSPITAL_COMMUNITY): Payer: Medicaid Other | Attending: Cardiology

## 2019-01-09 ENCOUNTER — Telehealth: Payer: Self-pay

## 2019-01-09 ENCOUNTER — Other Ambulatory Visit: Payer: Self-pay

## 2019-01-09 DIAGNOSIS — O903 Peripartum cardiomyopathy: Secondary | ICD-10-CM | POA: Insufficient documentation

## 2019-01-09 DIAGNOSIS — E0590 Thyrotoxicosis, unspecified without thyrotoxic crisis or storm: Secondary | ICD-10-CM

## 2019-01-09 NOTE — Telephone Encounter (Signed)
received call from patient states she was returning my call. informed her of her lab results and asked if she had a PCP. She stated no and I told her she needs to establish care with one and that I would me more than happy to help her find one. Went to the [facility] to help her find a pcp. I put the referral in for [facility] because they are easier to get an appt with. Advised patient that I would speak with one of our checkout ladies and get back to her. She voiced understanding.

## 2019-01-10 NOTE — Telephone Encounter (Signed)
Left message for patient to call regarding appointment with [facility]CP [facility].

## 2019-01-11 NOTE — Telephone Encounter (Signed)
Left message for patient informing her that an appt has been made for her to establish a PCP. Left her  the appt date and time and location of appt. Informed her that when she see's Dr Percival Spanish on Tuesday (01/15/2019) that the appt date, time and address will be on her AVS. Advised her to contact the office with any questions or concerns.

## 2019-01-13 NOTE — Progress Notes (Deleted)
Cardiology Office Note   Date:  01/15/2019   ID:  Sarah Reilly, DOB 17-Jan-1981, MRN [id number]  PCP:  Patient, No Pcp Per  Cardiologist:   No primary care provider on file. Referring:    No chief complaint on file.     History of Present Illness: Sarah Reilly is a 38 y.o. female who presents for ***      female who is referred by herself for follow up of systolic HF.   I saw her in 2012 for this.  She had an EF of 20 - 25%.   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  She has had very difficult social situations in the past was not compliant with follow up.   When I last saw her in 2012 she had had her fourth child.  We are trying to manage her medically.  Unfortunately she moved to [place] and got pregnant again.  She then moved to [place] to be by her mother.  She reports that she was followed at [place].  She had a high risk pregnancy and states she saw the OB GYNs and the cardiologist weekly.  At one point time it was suggested that she would get an LVAD but she did not want this.  I do not have any of these records and cannot access them electronically.  She says that her ejection fraction was at the last measure 24%.  She moved back to the area.  Her EF earlier this year was 40% which is improved from the previous low of 25%.   I wanted her to be followed in the HF clinic but she did have follow in HF but has not been consistent with follow up.  She could not tolerate Entresto. She has been in the ED.  She did leave once AMA despite an abnormal CXR.  At the last visit in the office she had chest pain but refused to go to the ED.  She had some lower extremity edema.  She was treated with Lasix.    Her pro BNP was markedly elevated at 1,581.   TSH was <0.006.  T4 was >7.77.   ***  She did have a sleep study.  ***   She has not had her medications in months.  She lives at home with her 38 year old, 38 year old, 38 year old, 20-year-old and 82-year-old.  She is had trouble getting  her Medicaid.  She was late to this appointment because she had to take the bus.  She has no family to help her.  She reports that she will get short of breath walking a couple of blocks.  She does have some shortness of breath lying flat.  She has sleep apnea that is not treated.  However, all of this is baseline.  She gained quite a bit of weight with her pregnancy but this is back down but not to her previous baseline.  She has some lower extremity swelling.  She is not describing chest pressure, neck or arm discomfort.  She is not having any palpitations, presyncope or syncope.   Past Medical History:  Diagnosis Date  . Cardiomyopathy   . HTN (hypertension)   . Mitral regurgitation     No past surgical history on file.   Current Outpatient Medications  Medication Sig Dispense Refill  . aspirin-acetaminophen-caffeine (EXCEDRIN MIGRAINE) 250-250-65 MG tablet Take 2 tablets by mouth every 6 (six) hours as needed for headache.    . furosemide (LASIX) 40 MG tablet  Take 1 tablet (40 mg total) by mouth daily. 30 tablet 0  . lisinopril (PRINIVIL,ZESTRIL) 10 MG tablet Take 10 mg by mouth daily.    . metoprolol succinate (TOPROL-XL) 50 MG 24 hr tablet 1 1/2 TAB AT BEDTIME 45 tablet 3  . nitroGLYCERIN (NITROSTAT) 0.4 MG SL tablet Place 1 tablet (0.4 mg total) under the tongue every 5 (five) minutes as needed for chest pain. WHILE SITTING 25 tablet 3  . pantoprazole (PROTONIX) 40 MG tablet Take 1 tablet (40 mg total) by mouth daily. 30 tablet 11  . potassium chloride SA (KLOR-CON M20) 20 MEQ tablet Take 1 tablet (20 mEq total) by mouth daily. 30 tablet 0  . Simethicone 80 MG TABS Take 2 tablets (160 mg total) by mouth 4 (four) times daily -  before meals and at bedtime. 60 tablet 3   No current facility-administered medications for this visit.     Allergies:   Celery oil, Shellfish allergy, and Hydrocodone    Social History:  The patient  reports that she has quit smoking. She has never used  smokeless tobacco. She reports current alcohol use of about 1.0 standard drinks of alcohol per week. She reports that she does not use drugs.   Family History:  The patient's ***family history includes Diabetes in her mother; Heart disease in her mother; Heart murmur in her sister; Hypertension in her father and mother.    ROS:  Please see the history of present illness.   Otherwise, review of systems are positive for {NONE DEFAULTED:18576::"none"}.   All other systems are reviewed and negative.    PHYSICAL EXAM: VS:  There were no vitals taken for this visit. , BMI There is no height or weight on file to calculate BMI. GENERAL:  Well appearing NECK:  No jugular venous distention, waveform within normal limits, carotid upstroke brisk and symmetric, no bruits, no thyromegaly LUNGS:  Clear to auscultation bilaterally CHEST:  Unremarkable HEART:  PMI not displaced or sustained,S1 and S2 within normal limits, no S3, no S4, no clicks, no rubs, *** murmurs ABD:  Flat, positive bowel sounds normal in frequency in pitch, no bruits, no rebound, no guarding, no midline pulsatile mass, no hepatomegaly, no splenomegaly EXT:  2 plus pulses throughout, no edema, no cyanosis no clubbing    ***GENERAL:  Well appearing HEENT:  Pupils equal round and reactive, fundi not visualized, oral mucosa unremarkable NECK:  No jugular venous distention, waveform within normal limits, carotid upstroke brisk and symmetric, no bruits, no thyromegaly LYMPHATICS:  No cervical, inguinal adenopathy LUNGS:  Clear to auscultation bilaterally BACK:  No CVA tenderness CHEST:  Unremarkable HEART:  PMI not displaced or sustained,S1 and S2 within normal limits, no S3, no S4, no clicks, no rubs, *** murmurs ABD:  Flat, positive bowel sounds normal in frequency in pitch, no bruits, no rebound, no guarding, no midline pulsatile mass, no hepatomegaly, no splenomegaly EXT:  2 plus pulses throughout, no edema, no cyanosis no clubbing  SKIN:  No rashes no nodules NEURO:  Cranial nerves II through XII grossly intact, motor grossly intact throughout PSYCH:  Cognitively intact, oriented to person place and time    EKG:  EKG {ACTION; IS/IS [id number]}OT:[id number]} ordered today. The ekg ordered today demonstrates ***   Recent Labs: 03/15/2018: B Natriuretic Peptide 255.9 11/28/2018: Hemoglobin 11.9; Platelets 187 01/01/2019: BUN 10; Creatinine, Ser 0.35; NT-Pro BNP 1,581; Potassium 4.2; Sodium 139; TSH <0.006    Lipid Panel No results found for: CHOL, TRIG, HDL, CHOLHDL, VLDL,  LDLCALC, LDLDIRECT    Wt Readings from Last 3 Encounters:  01/01/19 260 lb (117.9 kg)  12/11/18 260 lb (117.9 kg)  03/15/18 235 lb (106.6 kg)    Lab Results  Component Value Date   HGBA1C 5.0 01/01/2019    Other studies Reviewed: Additional studies/ records that were reviewed today include: ***. Review of the above records demonstrates:  Please see elsewhere in the note.  ***   ASSESSMENT AND PLAN:  Chronic systolic HF:  Her EF was improved.  She has not tolerated Entresto.  She has been seen by HF Clinic and it has been suggested that the have an MRI and CPX.  ***   Chest pain *** She complains of daily chest pain with exertion and radiation to her arms and neck. We advised her this would be best evaluated in the ED but she declined at this time.   Essential hypertension ***  Controlled  Severe mitral regurgitation This was moderate to severe on the last echo. ***  repeat echo to be scheduled  Morbid obesity (HCC) *** BMI 40- sleep study Aug 2019 negative for C-pap indication  Sinus tachycardia *** Despite BB therapy- check TSH and free T4  Abnormal thyroid studies ***   Current medicines are reviewed at length with the patient today.  The patient {ACTIONS; HAS/DOES NOT HAVE:19233} concerns regarding medicines.  The following changes have been made:  {PLAN; NO CHANGE:13088:s}  Labs/ tests ordered today include: *** No orders  of the defined types were placed in this encounter.    Disposition:   FU with ***    Signed, Rollene Rotunda, MD  01/15/2019 10:51 AM    [facility]

## 2019-01-14 ENCOUNTER — Telehealth: Payer: Self-pay

## 2019-01-14 NOTE — Telephone Encounter (Signed)
JH 1020  LM2CB TO ASK COVID QUESTIONS

## 2019-01-15 ENCOUNTER — Telehealth (INDEPENDENT_AMBULATORY_CARE_PROVIDER_SITE_OTHER): Payer: Medicaid Other | Admitting: Cardiology

## 2019-01-15 ENCOUNTER — Ambulatory Visit: Payer: Medicaid Other | Admitting: Cardiology

## 2019-01-15 ENCOUNTER — Encounter: Payer: Self-pay | Admitting: Cardiology

## 2019-01-15 VITALS — BP 159/97 | HR 100 | Ht 67.0 in | Wt 218.0 lb

## 2019-01-15 DIAGNOSIS — I5021 Acute systolic (congestive) heart failure: Secondary | ICD-10-CM | POA: Diagnosis not present

## 2019-01-15 DIAGNOSIS — Z7189 Other specified counseling: Secondary | ICD-10-CM

## 2019-01-15 DIAGNOSIS — E0590 Thyrotoxicosis, unspecified without thyrotoxic crisis or storm: Secondary | ICD-10-CM

## 2019-01-15 DIAGNOSIS — I340 Nonrheumatic mitral (valve) insufficiency: Secondary | ICD-10-CM | POA: Diagnosis not present

## 2019-01-15 MED ORDER — FUROSEMIDE 40 MG PO TABS: 40.0000 mg | ORAL_TABLET | Freq: Every day | ORAL | 0 refills | Status: DC

## 2019-01-15 MED ORDER — POTASSIUM CHLORIDE CRYS ER 20 MEQ PO TBCR: 20.0000 meq | EXTENDED_RELEASE_TABLET | Freq: Every day | ORAL | 0 refills | Status: DC

## 2019-01-15 NOTE — Progress Notes (Signed)
Virtual Visit via Telephone Note   This visit type was conducted due to national recommendations for restrictions regarding the COVID-19 Pandemic (e.g. social distancing) in an effort to limit this patient's exposure and mitigate transmission in our community.  Due to her co-morbid illnesses, this patient is at least at moderate risk for complications without adequate follow up.  This format is felt to be most appropriate for this patient at this time.  The patient did not have access to video technology/had technical difficulties with video requiring transitioning to audio format only (telephone).  All issues noted in this document were discussed and addressed.  No physical exam could be performed with this format.  Please refer to the patient's chart for her  consent to telehealth for [facility].   Date:  01/15/2019   ID:  Sarah Reilly, DOB 09/03/1980, MRN [id number]  Patient Location: Home Provider Location: Home  PCP:  Patient, No Pcp Per  Cardiologist:  Rollene RotundaJames Delainey Winstanley, MD  Electrophysiologist:  None   Evaluation Performed:  Follow-Up Visit  Chief Complaint:  SOB  History of Present Illness:    Sarah Reilly is a 38 y.o. female wit  a hx of post partum cardiomyopathy dating back to 2010. She was followed in [place] for a time.  She declined an ICD.  She has been seen by myself and HF clinic with Dr Gala RomneyBensimhon after an echo in May 2019 showed her EF to be 20-25% with severe MR.  Dr Gala RomneyBensimhon recommended aggressive medical Rx and a repeat echo in 3 months, a cMRI and CPX.  The patient could not tolerate Entresto secondary to abdominal pain and was placed on Lisinopril.  Sleep study in Aug 2019 did not suggest she needed C-pap. Her last OV was Sept 2019.  She presented to the ED 11/28/2018 with epigastric pain and left AMA before troponin could be cycled. Her initial troponin was 0.08. Her chest CT was not normal-  "scattered, nonspecific infectious or inflammatory subpleural  ground-glass pulmonary opacities bilaterally. There are a spectrum of findings in the lungs which can be seen with acute atypical infection (as well as other non-infectious etiologies). In particular, viral pneumonia (including COVID-19) should be considered in the appropriate clinical setting." She was afebrile and had essentially unremarkable BMP and CBC.  Her EKG was remarkable for sinus tachycardia and LVH. She did have a follow up echo and her EF was improved to 30 - 35%.  She was seen a couple of times virtually.  She was recently noted to have a very low TSH and elevated T4.    She was supposed to be in the office today but did not present in person.  We were able to get consent for a phone visit.  She has a very difficult social situation and lives at home with, I believe, 4 children.  She says that she just got a notice that she will need to be out of her home within 30 days.  She does not have anywhere else to live.  She says she has overdue water bills and is having an argument with the landlord because of black mold and that she had to throw out much of her furniture.    She reports that she has increased leg swelling.  Recently when she had a telehealth visit with Corine ShelterLuke Kilroy PAc she was given 5 days of Lasix and this helped.  Of note at that time proBNP was elevated.  She is not weighing herself.  It sounds like she is taking her other meds as listed.  She does sleep on several pillows.  She says that she has pain when she lies flat like symptoms she had GI before.  She stands up and burps and feels OK.  She feels like she has "snot" and phlegm that is blood tinged and green.  She says that she is not having fevers or chills and she has had absolutely no contact with COVID 19.    The patient does not have symptoms concerning for COVID-19 infection (fever, chills, cough, or new shortness of breath).    Past Medical History:  Diagnosis Date  . Cardiomyopathy   . HTN (hypertension)   . Mitral  regurgitation    No past surgical history on file.   Prior to Admission medications   Medication Sig Start Date End Date Taking? Authorizing Provider  aspirin-acetaminophen-caffeine (EXCEDRIN MIGRAINE) [phone] MG tablet Take 2 tablets by mouth every 6 (six) hours as needed for headache.   Yes [provider]  lisinopril (PRINIVIL,ZESTRIL) 10 MG tablet Take 10 mg by mouth daily.   Yes [provider]  metoprolol succinate (TOPROL-XL) 50 MG 24 hr tablet 1 1/2 TAB AT BEDTIME 12/11/18  Yes Jodelle Gross, NP  furosemide (LASIX) 40 MG tablet Take 1 tablet (40 mg total) by mouth daily. Patient not taking: Reported on 01/15/2019 01/01/19 01/31/19  Abelino Derrick, PA-C  nitroGLYCERIN (NITROSTAT) 0.4 MG SL tablet Place 1 tablet (0.4 mg total) under the tongue every 5 (five) minutes as needed for chest pain. WHILE SITTING Patient not taking: Reported on 01/15/2019 12/11/18 03/11/19  Jodelle Gross, NP  pantoprazole (PROTONIX) 40 MG tablet Take 1 tablet (40 mg total) by mouth daily. Patient not taking: Reported on 01/15/2019 12/11/18   Jodelle Gross, NP  potassium chloride SA (KLOR-CON M20) 20 MEQ tablet Take 1 tablet (20 mEq total) by mouth daily. Patient not taking: Reported on 01/15/2019 01/01/19   Abelino Derrick, PA-C  Simethicone 80 MG TABS Take 2 tablets (160 mg total) by mouth 4 (four) times daily -  before meals and at bedtime. Patient not taking: Reported on 01/15/2019 12/11/18   Jodelle Gross, NP     Allergies:   Celery oil, Shellfish allergy, and Hydrocodone   Social History   Tobacco Use  . Smoking status: Former Games developer  . Smokeless tobacco: Never Used  Substance Use Topics  . Alcohol use: Yes    Alcohol/week: 1.0 standard drinks    Types: 1 Glasses of wine per week  . Drug use: Never     Family Hx: The patient's family history includes Diabetes in her mother; Heart disease in her mother; Heart murmur in her sister; Hypertension in her father and  mother.  ROS:   Please see the history of present illness.    As stated in the HPI and negative for all other systems.   Prior CV studies:   The following studies were reviewed today:  Echo  Labs/Other Tests and Data Reviewed:    EKG:  No ECG reviewed.  Recent Labs: 03/15/2018: B Natriuretic Peptide 255.9 11/28/2018: Hemoglobin 11.9; Platelets 187 01/01/2019: BUN 10; Creatinine, Ser 0.35; NT-Pro BNP 1,581; Potassium 4.2; Sodium 139; TSH <0.006   Recent Lipid Panel No results found for: CHOL, TRIG, HDL, CHOLHDL, LDLCALC, LDLDIRECT  Wt Readings from Last 3 Encounters:  01/15/19 218 lb (98.9 kg)  01/01/19 260 lb (117.9 kg)  12/11/18 260 lb (117.9 kg)  Objective:    Vital Signs:  BP (!) 159/97   Pulse 100   Ht 5\' 7"  (1.702 m)   Wt 218 lb (98.9 kg)   BMI 34.14 kg/m    VITAL SIGNS:  reviewed NA  ASSESSMENT & PLAN:    CHRONIC SYSTOLIC HF:  This is a very very difficult situation.  She needs consistent follow up.  She does have a new patient appt with an new primary tomorrow.  I confirmed this.  Again, we had wanted to see her in person but she did not do this.  I absolutely emphasized the need to go to her appt tomorrow.  For now she does have Lasix and can resume 40 mg daily and Kdur 20 meq.  I will try to get a Advanced HF appt for about two weeks as well.  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  She said she stopped calling them because she did not need them a few weeks ago.  She will otherwise continue the meds as listed.  I suspect she is taking these.  MR:  Moderate to severe.  This is likely functional and will be managed with meds as above.  She does need CPX and MRI but this would be difficult given her current condition medically and socially.    ABNORMAL TSH:  I emphasized with her the importance of follow up and treatment of this.  She again agrees to present for the appt tomorrow.  She is aware of the thyroid results.     COVID-19 Education: The signs  and symptoms of COVID-19 were discussed with the patient and how to seek care for testing (follow up with PCP or arrange E-visit).  The importance of social distancing was discussed today.  Time:   Today, I have spent 25 minutes with the patient with telehealth technology discussing the above problems.     Medication Adjustments/Labs and Tests Ordered: Current medicines are reviewed at length with the patient today.  Concerns regarding medicines are outlined above.   Tests Ordered: No orders of the defined types were placed in this encounter.   Medication Changes: No orders of the defined types were placed in this encounter.   Follow Up:  As above.   Signed, Minus Breeding, MD  01/15/2019 12:53 PM    [facility]

## 2019-01-15 NOTE — Progress Notes (Signed)
Error

## 2019-01-15 NOTE — Patient Instructions (Signed)
Medication Instructions:  The current medical regimen is effective;  continue present plan and medications as directed. Please refer to the Current Medication list given to you today. If you need a refill on your cardiac medications before your next appointment, please call your pharmacy.  Special Instructions: Someone will be calling you to schedule an heart failure follow up for 2 weeks.  Follow-Up: You will need a follow up appointment WE WILL AWAIT HEART FAILURE INPUT TO [place].  You may see Minus Breeding, MD or one of the following Advanced Practice Providers on your designated Care Team:  Rosaria Ferries, PA-C  Jory Sims, DNP, ANP     At [facility], you and your health needs are our priority.  As part of our continuing mission to provide you with exceptional heart care, we have created designated Provider Care Teams.  These Care Teams include your primary Cardiologist (physician) and Advanced Practice Providers (APPs -  Physician Assistants and Nurse Practitioners) who all work together to provide you with the care you need, when you need it.  Thank you for choosing [facility] at [facility]!!

## 2019-01-15 NOTE — Telephone Encounter (Signed)
S/W PT SHE STATES THAT SHE WOULD LIKE TO CHANGE TO VIRTUAL VISIT-OK TO CHANGE TO VIRTUAL @1245  PER [facility]

## 2019-01-16 ENCOUNTER — Encounter: Payer: Self-pay | Admitting: Family Medicine

## 2019-01-16 ENCOUNTER — Ambulatory Visit: Payer: Medicaid Other | Attending: Family Medicine | Admitting: Family Medicine

## 2019-01-16 ENCOUNTER — Other Ambulatory Visit: Payer: Self-pay

## 2019-01-16 DIAGNOSIS — L509 Urticaria, unspecified: Secondary | ICD-10-CM | POA: Diagnosis not present

## 2019-01-16 DIAGNOSIS — I5021 Acute systolic (congestive) heart failure: Secondary | ICD-10-CM | POA: Diagnosis not present

## 2019-01-16 DIAGNOSIS — E0590 Thyrotoxicosis, unspecified without thyrotoxic crisis or storm: Secondary | ICD-10-CM

## 2019-01-16 MED ORDER — METHIMAZOLE 5 MG PO TABS: 5.0000 mg | ORAL_TABLET | Freq: Three times a day (TID) | ORAL | 1 refills | Status: DC

## 2019-01-16 MED ORDER — HYDROXYZINE HCL 25 MG PO TABS: 25.0000 mg | ORAL_TABLET | Freq: Two times a day (BID) | ORAL | 1 refills | Status: DC | PRN

## 2019-01-16 NOTE — Progress Notes (Signed)
Thank you for seeing her!  Kerin Ransom PA-C 01/16/2019 3:24 PM

## 2019-01-16 NOTE — Progress Notes (Signed)
Virtual Visit via Telephone Note  I connected with Sarah Reilly, on 01/16/2019 at 11:58 AM by telephone due to the COVID-19 pandemic and verified that I am speaking with the correct person using two identifiers.   Consent: I discussed the limitations, risks, security and privacy concerns of performing an evaluation and management service by telephone and the availability of in person appointments. I also discussed with the patient that there may be a patient responsible charge related to this service. The patient expressed understanding and agreed to proceed.   Location of Patient: Home  Location of Provider: Clinic   Persons participating in Telemedicine visit: Chisa Z Gavrielle Neves Farrington-CMA Dr. Nelwyn Salisbury     History of Present Illness: She is a 38 year old female with a history of postpartum cardiomyopathy (EF 35 to 40%, left ventricular diffuse hypokinesis moderately reduced systolic function from  Echo of 12/2018) followed by the CHF clinic, last seen by Dr. Antoine Poche yesterday. Labs performed by heart failure clinic revealed suppressed TSH of less than 0.006, elevated T4 of greater than 7.77.  She is establishing care today and was scheduled for an in person visit however she woke up late and visit had to be done via telehealth.  She denies known history of thyroid disease but endorses presence of palpitations which she describes as her heart pounding, has also had diarrhea and she also complains of lower extremity weakness to the point where someone has to help her get off the toilet seat.  Denies weight loss, proptosis, voice changes or presence of neck mass. Her pedal edema improved since she was placed on regular dose of Lasix by her cardiologist. Denies dyspnea. She complains of hives which she noticed 2 weeks ago which are itchy and she denies introduction of new foods soaps or creams but started Lasix recently and informs me she has taken Lasix in the past with  no previous reactions.   Past Medical History:  Diagnosis Date  . Cardiomyopathy   . HTN (hypertension)   . Mitral regurgitation    Allergies  Allergen Reactions  . Celery Oil Anaphylaxis  . Shellfish Allergy Anaphylaxis  . Hydrocodone     Current Outpatient Medications on File Prior to Visit  Medication Sig Dispense Refill  . aspirin-acetaminophen-caffeine (EXCEDRIN MIGRAINE) 250-250-65 MG tablet Take 2 tablets by mouth every 6 (six) hours as needed for headache.    . furosemide (LASIX) 40 MG tablet Take 1 tablet (40 mg total) by mouth daily. 30 tablet 0  . lisinopril (PRINIVIL,ZESTRIL) 10 MG tablet Take 10 mg by mouth daily.    . metoprolol succinate (TOPROL-XL) 50 MG 24 hr tablet 1 1/2 TAB AT BEDTIME 45 tablet 3  . nitroGLYCERIN (NITROSTAT) 0.4 MG SL tablet Place 1 tablet (0.4 mg total) under the tongue every 5 (five) minutes as needed for chest pain. WHILE SITTING (Patient not taking: Reported on 01/15/2019) 25 tablet 3  . pantoprazole (PROTONIX) 40 MG tablet Take 1 tablet (40 mg total) by mouth daily. (Patient not taking: Reported on 01/15/2019) 30 tablet 11  . potassium chloride SA (KLOR-CON M20) 20 MEQ tablet Take 1 tablet (20 mEq total) by mouth daily. (Patient not taking: Reported on 01/16/2019) 30 tablet 0  . Simethicone 80 MG TABS Take 2 tablets (160 mg total) by mouth 4 (four) times daily -  before meals and at bedtime. (Patient not taking: Reported on 01/15/2019) 60 tablet 3   No current facility-administered medications on file prior to visit.     Observations/Objective:  Awake, alert, oriented x3 Not in acute distress  Assessment and Plan: 1. Hyperthyroidism New diagnosis - methimazole (TAPAZOLE) 5 MG tablet; Take 1 tablet (5 mg total) by mouth 3 (three) times daily.  Dispense: 90 tablet; Refill: 1 - Ambulatory referral to Endocrinology - NM Thyroid Scan/Uptake 24 Hr; Future  2. Hives Unknown etiology Trial of hydroxyzine If symptoms persist we will need to  perform an allergy test and review her medications - hydrOXYzine (ATARAX/VISTARIL) 25 MG tablet; Take 1 tablet (25 mg total) by mouth 2 (two) times daily as needed for itching.  Dispense: 60 tablet; Refill: 1  3. Acute systolic HF (heart failure) (HCC) EF 35 to 40% Still has some pedal edema which has improved on Lasix Unable to tolerate Entresto as per cardiology note Continue current management Advised to keep in person visit with Cardiology    Follow Up Instructions: Return in about 1 month (around 02/16/2019) for coordination of care.    I discussed the assessment and treatment plan with the patient. The patient was provided an opportunity to ask questions and all were answered. The patient agreed with the plan and demonstrated an understanding of the instructions.   The patient was advised to call back or seek an in-person evaluation if the symptoms worsen or if the condition fails to improve as anticipated.     I provided 18 minutes total of non-face-to-face time during this encounter including median intraservice time, reviewing previous notes, labs, imaging, medications, management and patient verbalized understanding.     [name], MD, FAAFP. [facility] [place], [place]   01/16/2019, 11:58 AM

## 2019-01-16 NOTE — Progress Notes (Signed)
Patient has been called and DOB has been verified. Patient has been screened and transferred to PCP to start phone visit.     

## 2019-01-17 ENCOUNTER — Telehealth: Payer: Self-pay | Admitting: Licensed Clinical Social Worker

## 2019-01-17 NOTE — Telephone Encounter (Signed)
CSW contacted patient and spoke about her ongoing issues with the water company and her landlord. Patient has a $2,000 water bill due to leaks in the piping. Patient will follow up with water company and Elma will contact landlord. Patient grateful for the support and will discuss further with CSW tomorrow. Raquel Sarna, [place], [place]

## 2019-01-18 ENCOUNTER — Telehealth: Payer: Self-pay | Admitting: Licensed Clinical Social Worker

## 2019-01-18 NOTE — Telephone Encounter (Signed)
CSW and patient had extensive conversation today about ongoing issues with property management and water bills. Patient received a vacate letter by August 13. CSW contacted the property management office and informed patient is not being evicted just needs to vacate as they need to "tear down the walls and rip up the floors to fix the leaking water causing the mold in the apartment". CSW asked for assistance with relocating patient and children as finding an apartment for a low income family is quite challenging at this time. Amy from [employer] they have nothing available at this time. CSW shared conversation with patient and obtained permission to reach out to [employer] for further assistance. Patient grateful for support and assistance. CSW will await return call from [employer] and follow up with patient next week. Raquel Sarna, [place], [place]

## 2019-01-23 ENCOUNTER — Other Ambulatory Visit: Payer: Self-pay

## 2019-01-23 ENCOUNTER — Encounter (HOSPITAL_COMMUNITY)
Admission: RE | Admit: 2019-01-23 | Discharge: 2019-01-23 | Disposition: A | Discharge status: Home / Self Care | Payer: Medicaid Other | Source: Ambulatory Visit | Attending: Family Medicine | Admitting: Family Medicine

## 2019-01-23 ENCOUNTER — Encounter (HOSPITAL_COMMUNITY): Payer: Self-pay

## 2019-01-23 DIAGNOSIS — E0590 Thyrotoxicosis, unspecified without thyrotoxic crisis or storm: Secondary | ICD-10-CM | POA: Insufficient documentation

## 2019-01-23 NOTE — Progress Notes (Signed)
Patient ID: Sarah Reilly, female   DOB: 1980-11-26, 38 y.o.   MRN: [id number]                                                                                                              Today's office visit was provided via telemedicine using video technique The patient was explained the limitations of evaluation and management by telemedicine and the availability of in person appointments.  The patient understood the limitations and agreed to proceed. Patient also understood that the telehealth visit is billable. . Location of the patient: Patient's home . Location of the provider: Physician office Only the patient and myself were participating in the encounter    Reason for Appointment:  Hyperthyroidism, new consultation  Referring healthcare provider:   Chief complaint: Weakness   History of Present Illness:   For the last 4 months she was having symptoms of her heart beating fast despite her being on metoprolol for history of CHF  Although she tends to feel hot chronically she was having more sweating in the last couple of months Also in the last 2 months she has had symptoms of her leg swelling, chest discomfort, cough and her legs getting weaker At times she will not be able to get up from a low seat because of leg weakness She did not start having any symptoms of shakiness of her hands She does not think she has lost any weight However her weight previously had been about 240 in 2019 Over the last month or so she has had a splotchy rash on her hands, knees and chest area She does have previous history of hives and she was also having some hives again recently Another symptom she has had recently has been loose frequent stools for about a month  She has noticed the swelling in the front of her neck also without pain but she sometimes feels a difficulty swallowing  When seen in her cardiology follow-up on 7/7 she was having symptoms of shortness of breath on exertion  also along with some palpitations Thyroid levels were checked and showed significant hyperthyroidism She was referred to her PCP for further management   Treatments so far: Metolazone 5 mg 3 times daily for about 7 days With this she is started feeling a little better with her above symptoms but she is still concerned about some persistent rash  Thyroid function tests as follows:     Lab Results  Component Value Date   FREET4 >7.77 (H) 01/01/2019   TSH <0.006 (L) 01/01/2019   TSH 0.674 11/03/2017   TSH 0.33 (L) 04/22/2009    No results found for: THYROTRECAB   Allergies as of 01/24/2019      Reactions   Celery Oil Anaphylaxis   Shellfish Allergy Anaphylaxis   Hydrocodone       Medication List       Accurate as of January 23, 2019  9:36 PM. If you have any questions, ask your nurse or doctor.  aspirin-acetaminophen-caffeine 250-250-65 MG tablet Commonly known as: EXCEDRIN MIGRAINE Take 2 tablets by mouth every 6 (six) hours as needed for headache.   furosemide 40 MG tablet Commonly known as: LASIX Take 1 tablet (40 mg total) by mouth daily.   hydrOXYzine 25 MG tablet Commonly known as: ATARAX/VISTARIL Take 1 tablet (25 mg total) by mouth 2 (two) times daily as needed for itching.   lisinopril 10 MG tablet Commonly known as: ZESTRIL Take 10 mg by mouth daily.   methimazole 5 MG tablet Commonly known as: TAPAZOLE Take 1 tablet (5 mg total) by mouth 3 (three) times daily.   metoprolol succinate 50 MG 24 hr tablet Commonly known as: TOPROL-XL 1 1/2 TAB AT BEDTIME   nitroGLYCERIN 0.4 MG SL tablet Commonly known as: NITROSTAT Place 1 tablet (0.4 mg total) under the tongue every 5 (five) minutes as needed for chest pain. WHILE SITTING   pantoprazole 40 MG tablet Commonly known as: PROTONIX Take 1 tablet (40 mg total) by mouth daily.   potassium chloride SA 20 MEQ tablet Commonly known as: Klor-Con M20 Take 1 tablet (20 mEq total) by mouth daily.    Simethicone 80 MG Tabs Take 2 tablets (160 mg total) by mouth 4 (four) times daily -  before meals and at bedtime.           Past Medical History:  Diagnosis Date  . Cardiomyopathy   . HTN (hypertension)   . Mitral regurgitation     No past surgical history on file.  Family History  Problem Relation Age of Onset  . Heart disease Mother        No details  . Hypertension Mother   . Diabetes Mother   . Hypertension Father   . Heart murmur Sister     Social History:  reports that she has quit smoking. She has never used smokeless tobacco. She reports current alcohol use of about 1.0 standard drinks of alcohol per week. She reports that she does not use drugs.  Allergies:  Allergies  Allergen Reactions  . Celery Oil Anaphylaxis  . Shellfish Allergy Anaphylaxis  . Hydrocodone      Review of Systems  Constitutional: Positive for weight loss.  HENT: Positive for trouble swallowing.   Eyes: Negative for visual disturbance.  Respiratory: Positive for shortness of breath.   Cardiovascular: Positive for chest pain and palpitations.  Gastrointestinal: Positive for diarrhea.  Endocrine: Negative for polydipsia.  Genitourinary: Negative for frequency.  Skin: Positive for rash.  Neurological: Positive for weakness. Negative for tremors.  Psychiatric/Behavioral: Negative for insomnia.    Last childbirth 6 years ago   Examination:   There were no vitals taken for this visit.  Mild prominence of her neck anteriorly is visible No tremor of hands seen on video    Assessment/Plan:   Hyperthyroidism, Likely to be from Graves' disease   She has had significant symptoms along with marked increase in her free T4 level about the detection limit in the lab Although she was not examined she has noticed the swelling in her thyroid area  Her symptoms and very high thyroid levels are indicating Graves' disease rather than thyroiditis Currently she is better with symptomatically  with 15 mg of methimazole but has taken this only for a week   Discussed with the patient the hyperthyroidism is a result of an autoimmune condition involving the thyroid.  Explained that the options for treatment are basically antithyroid drugs and radioactive iodine.  Discussed the pros and cons  for each treatment: Antithyroid drugs would be reasonable for mild disease but would need frequent followup with lab monitoring as well as potential for side effects from the medications and uncertainty about long-term cure of the problem Discussed that I-131 treatment is safe and simple to do but will result in long-term hypothyroidism that will result from ablation of the thyroid tissue and the need for lifelong supplementation and periodic monitoring.  Currently would be appropriate for the patient to be treated with antithyroid drugs at this time with methimazole to get her symptoms completely controlled and normalize her thyroid levels  She will need METHIMAZOLE 10 mg twice daily to start with and will reassess her levels in 2 weeks Discussed that I-131 uptake and scan is not necessary at this time and will only be done in conjunction with I-131 treatment if needed  Patient information on hyperthyroidism and radioactive iodine treatment given through my chart  Patient understands the above discussion and treatment options. All questions were answered satisfactorily  Consult note sent to referring physician  Reather Littlerjay Kiefer Opheim 01/23/2019, 9:36 PM    Note: This office note was prepared with Dragon voice recognition system technology. Any transcriptional errors that result from this process are unintentional.

## 2019-01-24 ENCOUNTER — Ambulatory Visit (INDEPENDENT_AMBULATORY_CARE_PROVIDER_SITE_OTHER): Payer: Medicaid Other | Admitting: Endocrinology

## 2019-01-24 ENCOUNTER — Encounter: Payer: Self-pay | Admitting: Endocrinology

## 2019-01-24 ENCOUNTER — Encounter (HOSPITAL_COMMUNITY): Payer: Medicaid Other

## 2019-01-24 ENCOUNTER — Telehealth: Payer: Self-pay | Admitting: Licensed Clinical Social Worker

## 2019-01-24 DIAGNOSIS — E0590 Thyrotoxicosis, unspecified without thyrotoxic crisis or storm: Secondary | ICD-10-CM | POA: Diagnosis not present

## 2019-01-24 MED ORDER — METHIMAZOLE 10 MG PO TABS: 10.0000 mg | ORAL_TABLET | Freq: Two times a day (BID) | ORAL | 1 refills | Status: DC

## 2019-01-24 NOTE — Telephone Encounter (Signed)
CSW spoke with patient who gave verbal consent for [place] referrals for housing assistance due to notice of non renewal of her lease. Patient states that needed repairs to the plumbing are needed and she currently has a $2,000 water bill that the landlord is holding her accountable for despite the leak in the plumbing. CSW continues to follow for support, housing and financial needs. Raquel Sarna, [place], [place]

## 2019-02-01 ENCOUNTER — Telehealth: Payer: Self-pay | Admitting: Licensed Clinical Social Worker

## 2019-02-01 NOTE — Telephone Encounter (Signed)
CSW spoke with patient yesterday who reports that she has been offered alternative housing within her complex. Patient was referred to [facility] who have intervened with the landlord and a vacant apartment has become available for patient and family to move while needed repairs are made to her current apartment. Patient is awaiting alk through on new apartment and will reach out to CSW if further needs arise. Raquel Sarna, [place], [place]

## 2019-02-04 ENCOUNTER — Encounter (HOSPITAL_COMMUNITY): Payer: Medicaid Other

## 2019-02-04 ENCOUNTER — Ambulatory Visit: Payer: Medicaid Other | Admitting: Nurse Practitioner

## 2019-02-11 ENCOUNTER — Ambulatory Visit (INDEPENDENT_AMBULATORY_CARE_PROVIDER_SITE_OTHER): Payer: Medicaid Other | Admitting: Endocrinology

## 2019-02-11 ENCOUNTER — Encounter: Payer: Self-pay | Admitting: Endocrinology

## 2019-02-11 ENCOUNTER — Other Ambulatory Visit: Payer: Self-pay

## 2019-02-11 ENCOUNTER — Ambulatory Visit (HOSPITAL_COMMUNITY): Admission: RE | Admit: 2019-02-11 | Payer: Medicaid Other | Source: Ambulatory Visit

## 2019-02-11 ENCOUNTER — Ambulatory Visit (HOSPITAL_COMMUNITY): Payer: Medicaid Other

## 2019-02-11 ENCOUNTER — Other Ambulatory Visit (HOSPITAL_COMMUNITY): Payer: Medicaid Other

## 2019-02-11 ENCOUNTER — Other Ambulatory Visit: Payer: Self-pay | Admitting: Cardiology

## 2019-02-11 VITALS — BP 130/84 | HR 96 | Ht 67.0 in | Wt 202.8 lb

## 2019-02-11 DIAGNOSIS — L239 Allergic contact dermatitis, unspecified cause: Secondary | ICD-10-CM | POA: Diagnosis not present

## 2019-02-11 DIAGNOSIS — E0590 Thyrotoxicosis, unspecified without thyrotoxic crisis or storm: Secondary | ICD-10-CM

## 2019-02-11 LAB — T3, FREE: T3, Free: 4.7 pg/mL — ABNORMAL HIGH (ref 2.3–4.2)

## 2019-02-11 LAB — T4, FREE: Free T4: 1.26 ng/dL (ref 0.60–1.60)

## 2019-02-11 MED ORDER — TRIAMCINOLONE ACETONIDE 0.1 % EX CREA: 1.0000 "application " | TOPICAL_CREAM | Freq: Two times a day (BID) | CUTANEOUS | 0 refills | Status: DC

## 2019-02-11 NOTE — Patient Instructions (Signed)
Metoprolol 2x daily

## 2019-02-11 NOTE — Progress Notes (Signed)
Patient ID: Sarah Reilly, female   DOB: 19-Aug-1980, 38 y.o.   MRN: [id number]                                                                                                                 Reason for Appointment:  Hyperthyroidism, follow-up   Referring healthcare provider: Dr.Newlin   Chief complaint: Palpitations   History of Present Illness:   History at initial consultation   For the last 4 months she was having symptoms of her heart beating fast despite her being on metoprolol for history of CHF Although she tends to feel hot chronically she was having more sweating in the last couple of months Also in the last 2 months she has had symptoms of her leg swelling, chest discomfort, cough and her legs getting weaker At times she will not be able to get up from a low seat because of leg weakness She did not start having any symptoms of shakiness of her hands She does not think she has lost any weight However her weight previously had been about 240 in 2019 Over the last month or so she has had a splotchy rash on her hands, knees and chest area She does have previous history of hives and she was also having some hives again recently Another symptom she has had recently has been loose frequent stools for about a month  When seen in her cardiology follow-up on 7/7 she was having symptoms of shortness of breath on exertion also along with some palpitations Thyroid levels were checked and showed significant hyperthyroidism   RECENT HISTORY:  On initial visit her methimazole was increased to 10 mg twice daily because of her very high baseline thyroid levels She was still having mild persistent symptoms of hyperthyroidism at that time  Overall she is feeling better However she is still having some feelings of her heart pounding, a little shaky and temperature swings. Her leg weakness is improving She has no diarrhea and her breathing is a little better also  Not clear if she  has continued to lose weight  Wt Readings from Last 3 Encounters:  02/11/19 202 lb 12.8 oz (92 kg)  01/15/19 218 lb (98.9 kg)  01/01/19 260 lb (117.9 kg)     Thyroid function tests as follows:     Lab Results  Component Value Date   FREET4 >7.77 (H) 01/01/2019   TSH <0.006 (L) 01/01/2019   TSH 0.674 11/03/2017   TSH 0.33 (L) 04/22/2009    No results found for: THYROTRECAB   Allergies as of 02/11/2019      Reactions   Celery Oil Anaphylaxis   Shellfish Allergy Anaphylaxis   Hydrocodone       Medication List       Accurate as of February 11, 2019 12:09 PM. If you have any questions, ask your nurse or doctor.        STOP taking these medications   potassium chloride SA 20 MEQ tablet Commonly known as: Klor-Con M20 Stopped  by: Elayne Snare, MD     TAKE these medications   aspirin-acetaminophen-caffeine [id number] MG tablet Commonly known as: EXCEDRIN MIGRAINE Take 2 tablets by mouth every 6 (six) hours as needed for headache.   furosemide 40 MG tablet Commonly known as: LASIX Take 1 tablet (40 mg total) by mouth daily. What changed:   when to take this  reasons to take this   hydrOXYzine 25 MG tablet Commonly known as: ATARAX/VISTARIL Take 1 tablet (25 mg total) by mouth 2 (two) times daily as needed for itching. What changed: when to take this   lisinopril 10 MG tablet Commonly known as: ZESTRIL Take 10 mg by mouth daily.   methimazole 10 MG tablet Commonly known as: TAPAZOLE Take 1 tablet (10 mg total) by mouth 2 (two) times daily. What changed:   how much to take  additional instructions   metoprolol succinate 50 MG 24 hr tablet Commonly known as: TOPROL-XL 1 1/2 TAB AT BEDTIME   nitroGLYCERIN 0.4 MG SL tablet Commonly known as: NITROSTAT Place 1 tablet (0.4 mg total) under the tongue every 5 (five) minutes as needed for chest pain. WHILE SITTING           Past Medical History:  Diagnosis Date  . Cardiomyopathy   . HTN (hypertension)    . Mitral regurgitation     History reviewed. No pertinent surgical history.  Family History  Problem Relation Age of Onset  . Heart disease Mother        No details  . Hypertension Mother   . Diabetes Mother   . Hypertension Father   . Heart murmur Sister     Social History:  reports that she has quit smoking. She has never used smokeless tobacco. She reports current alcohol use of about 1.0 standard drinks of alcohol per week. She reports that she does not use drugs.  Allergies:  Allergies  Allergen Reactions  . Celery Oil Anaphylaxis  . Shellfish Allergy Anaphylaxis  . Hydrocodone      Review of Systems  Constitutional: Positive for weight loss.  Respiratory: Negative for shortness of breath.   Cardiovascular: Positive for palpitations.  Gastrointestinal: Negative for diarrhea.  Endocrine: Positive for menstrual changes.       Menstrual cycles have been very irregular, sometimes lasting 2 weeks and occasionally skipping a month  Skin: Positive for rash.       She has a rash with itching on her upper chest  Neurological: Positive for weakness and tremors.    Last childbirth 6 years ago   Examination:   BP 130/84 (BP Location: Left Arm, Patient Position: Sitting, Cuff Size: Normal)   Pulse 96   Ht 5\' 7"  (1.702 m)   Wt 202 lb 12.8 oz (92 kg)   SpO2 98%   BMI 31.76 kg/m   Thyroid is enlarged bilaterally about twice normal, smooth and diffuse She has minimal tremor of her hands Reflexes are slightly brisk on biceps No ankle edema  She has a maculopapular rash on her upper chest bilaterally, relatively dark in color without scaling or exfoliation    Assessment/Plan:   Hyperthyroidism, appears to be secondary to Graves' disease   She has had significant baseline symptoms Although she is symptomatically better now with methimazole 10 mg twice daily she still has some persistent symptoms and signs  Has a relatively small goiter on exam today Since she has  had a history of cardiac problems would like to keep her heart rate controlled better also  Again discussed causation of her Graves' disease, need for treatment and consideration of I-131 as a possible treatment since she has severe hyperthyroidism to start with Currently emphasis will be on trying to get her thyroid levels normalized  She will need to have labs done today Baseline thyrotropin receptor antibody will be checked  RASH: This appears to be unrelated to hyperthyroidism and will start her on triamcinolone until she is seen by PCP  Total visit time for evaluation and management of multiple problems and counseling =25 minutes  Reather Littler 02/11/2019, 12:09 PM    Note: This office note was prepared with Dragon voice recognition system technology. Any transcriptional errors that result from this process are unintentional.

## 2019-02-12 ENCOUNTER — Encounter (HOSPITAL_COMMUNITY): Payer: Medicaid Other | Attending: Family Medicine

## 2019-02-12 LAB — THYROTROPIN RECEPTOR AUTOABS: Thyrotropin Receptor Ab: 72.3 IU/L — ABNORMAL HIGH (ref 0.00–1.75)

## 2019-02-15 ENCOUNTER — Telehealth (HOSPITAL_COMMUNITY): Payer: Self-pay | Admitting: Licensed Clinical Social Worker

## 2019-02-15 NOTE — Telephone Encounter (Signed)
Patient called to confirm that she is getting the keys to a new apartment on Monday. She stated there was a delay but all worked out now and she is grateful for the support and assistance. CSW will continue to be available as needed. Raquel Sarna, [place], [place]

## 2019-02-18 ENCOUNTER — Ambulatory Visit: Payer: Medicaid Other | Attending: Family Medicine | Admitting: Family Medicine

## 2019-02-18 ENCOUNTER — Other Ambulatory Visit: Payer: Self-pay

## 2019-02-19 ENCOUNTER — Encounter (HOSPITAL_COMMUNITY): Payer: Self-pay

## 2019-02-19 ENCOUNTER — Ambulatory Visit (HOSPITAL_COMMUNITY)
Admission: RE | Admit: 2019-02-19 | Discharge: 2019-02-19 | Disposition: A | Discharge status: Home / Self Care | Payer: Medicaid Other | Source: Ambulatory Visit | Attending: Internal Medicine | Admitting: Internal Medicine

## 2019-02-19 ENCOUNTER — Other Ambulatory Visit: Payer: Self-pay

## 2019-02-19 VITALS — BP 138/83 | HR 88 | Wt 205.0 lb

## 2019-02-19 DIAGNOSIS — Z79899 Other long term (current) drug therapy: Secondary | ICD-10-CM | POA: Insufficient documentation

## 2019-02-19 DIAGNOSIS — I429 Cardiomyopathy, unspecified: Secondary | ICD-10-CM | POA: Diagnosis not present

## 2019-02-19 DIAGNOSIS — I5022 Chronic systolic (congestive) heart failure: Secondary | ICD-10-CM | POA: Diagnosis not present

## 2019-02-19 DIAGNOSIS — E0590 Thyrotoxicosis, unspecified without thyrotoxic crisis or storm: Secondary | ICD-10-CM | POA: Diagnosis not present

## 2019-02-19 DIAGNOSIS — Z87891 Personal history of nicotine dependence: Secondary | ICD-10-CM | POA: Diagnosis not present

## 2019-02-19 DIAGNOSIS — Z7982 Long term (current) use of aspirin: Secondary | ICD-10-CM | POA: Diagnosis not present

## 2019-02-19 DIAGNOSIS — I110 Hypertensive heart disease with heart failure: Secondary | ICD-10-CM | POA: Insufficient documentation

## 2019-02-19 DIAGNOSIS — R0683 Snoring: Secondary | ICD-10-CM | POA: Diagnosis not present

## 2019-02-19 DIAGNOSIS — I340 Nonrheumatic mitral (valve) insufficiency: Secondary | ICD-10-CM | POA: Insufficient documentation

## 2019-02-19 DIAGNOSIS — Z8249 Family history of ischemic heart disease and other diseases of the circulatory system: Secondary | ICD-10-CM | POA: Insufficient documentation

## 2019-02-19 NOTE — Patient Instructions (Signed)
Please follow up with the [facility] in 3 months.  At the [facility], you and your health needs are our priority. As part of our continuing mission to provide you with exceptional heart care, we have created designated Provider Care Teams. These Care Teams include your primary Cardiologist (physician) and Advanced Practice Providers (APPs- Physician Assistants and Nurse Practitioners) who all work together to provide you with the care you need, when you need it.   You may see any of the following providers on your designated Care Team at your next follow up: . Dr Daniel [name] . Dr Dalton McLean . Amy Clegg, NP   Please be sure to bring in all your medications bottles to every appointment.    

## 2019-02-19 NOTE — Progress Notes (Signed)
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 Hyperthyroidism..   She developed HF after the birth of her 24th child in 2010. She was in [place] at the time. Saw Dr. Percival Spanish EF 20%. Did not have a heart cath. She then moved back to [place] to be by her mother. She was followed at [facility] by Dr. Sheliah Mends. She had a high-risk pregnancy and had her 5th  Child in 41. Apparently EF dropped to 15-20% for a period. They suggested ICD but she refused. She returned to [place] earlier this year and saw Dr. Percival Spanish. Was out of medicines for about about 1 month due to lack of insurance.   In July she was diagnosed with hyperthyroidism. Started on methimazole bu Dr Dwyane Dee.  Last week Dr Dwyane Dee increased toprol to 50 mg twice a day due to palpitations.    Today she returns for HF follow up. Overall feeling fine. Rarely having palpitations. SOB with exertion. Denies PND/Orthopnea. Appetite ok. No fever or chills. She has been weighing every now and then. Taking all medications.  Echo 12/2018 EF 35-40% Mod-Severe MR, RV normal Echo 11/16/2017 EF 20-25% Severe MR    ROS: All systems negative except as listed in HPI, PMH and Problem List.  SH:  Social History   Socioeconomic History  . Marital status: Single    Spouse name: Not on file  . Number of children: Not on file  . Years of education: Not on file  . Highest education level: Not on file  Occupational History  . Not on file  Social Needs  . Financial resource strain: Not on file  . Food insecurity    Worry: Not on file    Inability: Not on file  . Transportation needs    Medical: Not on file    Non-medical: Not on file  Tobacco Use  . Smoking status: Former Research scientist (life sciences)  . Smokeless tobacco: Never Used  Substance and Sexual Activity  . Alcohol use: Yes    Alcohol/week: 1.0  standard drinks    Types: 1 Glasses of wine per week  . Drug use: Never  . Sexual activity: Not on file  Lifestyle  . Physical activity    Days per week: Not on file    Minutes per session: Not on file  . Stress: Not on file  Relationships  . Social Herbalist on phone: Not on file    Gets together: Not on file    Attends religious service: Not on file    Active member of club or organization: Not on file    Attends meetings of clubs or organizations: Not on file    Relationship status: Not on file  . Intimate partner violence    Fear of current or ex partner: Not on file    Emotionally abused: Not on file    Physically abused: Not on file    Forced sexual activity: Not on file  Other Topics Concern  . Not on file  Social History Narrative   She has 4 children ages 72 and under.  She stays at home. She does not smoke cigarettes or drink alcohol. Does not get regular exercise.     FH:  Family History  Problem Relation Age of Onset  . Heart disease Mother        No details  .  Hypertension Mother   . Diabetes Mother   . Hypertension Father   . Heart murmur Sister     Past Medical History:  Diagnosis Date  . Cardiomyopathy   . HTN (hypertension)   . Mitral regurgitation     Current Outpatient Medications  Medication Sig Dispense Refill  . aspirin-acetaminophen-caffeine (EXCEDRIN MIGRAINE) 250-250-65 MG tablet Take 2 tablets by mouth every 6 (six) hours as needed for headache.    . furosemide (LASIX) 40 MG tablet Take 1 tablet (40 mg total) by mouth daily. (Patient taking differently: Take 40 mg by mouth daily as needed. ) 30 tablet 0  . hydrOXYzine (ATARAX/VISTARIL) 25 MG tablet Take 1 tablet (25 mg total) by mouth 2 (two) times daily as needed for itching. (Patient taking differently: Take 25 mg by mouth 3 times/day as needed-between meals & bedtime for itching. ) 60 tablet 1  . lisinopril (ZESTRIL) 10 MG tablet Take 1 tablet by mouth once daily 90 tablet 3   . methimazole (TAPAZOLE) 10 MG tablet Take 1 tablet (10 mg total) by mouth 2 (two) times daily. (Patient taking differently: Take 20 mg by mouth 2 (two) times daily. Take 2 tablets (20mg  total) by mouth twice daily.) 60 tablet 1  . metoprolol succinate (TOPROL-XL) 50 MG 24 hr tablet 1 1/2 TAB AT BEDTIME 45 tablet 3  . nitroGLYCERIN (NITROSTAT) 0.4 MG SL tablet Place 1 tablet (0.4 mg total) under the tongue every 5 (five) minutes as needed for chest pain. WHILE SITTING 25 tablet 3  . triamcinolone cream (KENALOG) 0.1 % Apply 1 application topically 2 (two) times daily. 30 g 0   No current facility-administered medications for this encounter.     Vitals:   02/19/19 1002  BP: 138/83  Pulse: 88  SpO2: 100%  Weight: 93 kg (205 lb)   Wt Readings from Last 3 Encounters:  02/19/19 93 kg (205 lb)  02/11/19 92 kg (202 lb 12.8 oz)  01/15/19 98.9 kg (218 lb)   PHYSICAL EXAM: General:  Well appearing. No resp difficulty HEENT: normal Neck: supple. JVP flat. Carotids 2+ bilaterally; no bruits. No lymphadenopathy or thryomegaly appreciated. Cor: PMI normal. Regular rate & rhythm. No rubs, gallops or murmurs. Lungs: clear Abdomen: soft, nontender, nondistended. No hepatosplenomegaly. No bruits or masses. Good bowel sounds. Extremities: no cyanosis, clubbing, rash, edema Neuro: alert & orientedx3, cranial nerves grossly intact. Moves all 4 extremities w/o difficulty. Affect pleasant.  ASSESSMENT & PLAN: 1. Chronic systolic HF - Onset in 2010. 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 No cath or cMRI yet - Echo 5/19 EF 20-25% with severe posterior MR.   - ECHO 12/2018 EF 35-40% mod-severe MR . - Volume status stable. Can continue lasix 40 mg as needed.  - Continue Toprol 50mg  twice a day.  - Continue lisinopril 10 mg daily.  - Intolerant spiro /entresto due to nausea/vomiting  2. Severe mitral regurgitation - this is functional MR.  - Discussed with structural heart team. May need TEE as some point but would  like to get thyroid controlled.  3. Heavy snoring/probable OSA  4. Hyperthyroidism On methimazole and followed by Dr Lucianne Muss  Social issues HFSW followed today.    Follow up in 3 months with Dr Gala Romney.   Vincent Ehrler NP-C  12:22 PM

## 2019-03-14 ENCOUNTER — Other Ambulatory Visit (INDEPENDENT_AMBULATORY_CARE_PROVIDER_SITE_OTHER): Payer: Medicaid Other

## 2019-03-14 ENCOUNTER — Other Ambulatory Visit: Payer: Self-pay

## 2019-03-14 DIAGNOSIS — E0590 Thyrotoxicosis, unspecified without thyrotoxic crisis or storm: Secondary | ICD-10-CM | POA: Diagnosis not present

## 2019-03-14 LAB — T3, FREE: T3, Free: 3.7 pg/mL (ref 2.3–4.2)

## 2019-03-14 LAB — T4, FREE: Free T4: 0.86 ng/dL (ref 0.60–1.60)

## 2019-03-17 NOTE — Progress Notes (Signed)
Patient ID: Sarah Reilly, female   DOB: 1981/01/07, 38 y.o.   MRN: [id number]                                                                                                                 Reason for Appointment:  Hyperthyroidism, follow-up  Today's office visit was provided via telemedicine using video technique The patient was explained the limitations of evaluation and management by telemedicine and the availability of in person appointments.  The patient understood the limitations and agreed to proceed. Patient also understood that the telehealth visit is billable. . Location of the patient: Patient's home . Location of the provider: Physician office Only the patient and myself were participating in the encounter    Referring healthcare provider: Dr.Newlin   Chief complaint: Follow-up of thyroid   History of Present Illness:   History at initial consultation   For the last 4 months she was having symptoms of her heart beating fast despite her being on metoprolol for history of CHF Although she tends to feel hot chronically she was having more sweating in the last couple of months Also in the last 2 months she has had symptoms of her leg swelling, chest discomfort, cough and her legs getting weaker At times she will not be able to get up from a low seat because of leg weakness She did not start having any symptoms of shakiness of her hands She does not think she has lost any weight However her weight previously had been about 240 in 2019 Over the last month or so she has had a splotchy rash on her hands, knees and chest area She does have previous history of hives and she was also having some hives again recently Another symptom she has had recently has been loose frequent stools for about a month  When seen in her cardiology follow-up on 7/7 she was having symptoms of shortness of breath on exertion also along with some palpitations Thyroid levels were checked and  showed significant hyperthyroidism   RECENT HISTORY:  On initial visit her methimazole was increased to 10 mg twice daily because of her high baseline thyroid levels Subsequently since T3 was still slightly high she is now taking an extra 5 mg methimazole also  Although overall she is not complaining of the same symptoms of palpitations, heat intolerance, weakness she thinks that she is recently starting to gain a little weight Also her right hand may shake at times, does have chronic anxiety  She has taken her methimazole regularly No fatigue  She has intermittent gas and diarrhea continuing  Labs show normal T4 and T3 levels from 9/17  Wt Readings from Last 3 Encounters:  02/19/19 205 lb (93 kg)  02/11/19 202 lb 12.8 oz (92 kg)  01/15/19 218 lb (98.9 kg)     Thyroid function tests as follows:     Lab Results  Component Value Date   FREET4 0.86 03/14/2019   FREET4 1.26 02/11/2019   FREET4 >7.77 (H) 01/01/2019  T3FREE 3.7 03/14/2019   T3FREE 4.7 (H) 02/11/2019   TSH <0.006 (L) 01/01/2019   TSH 0.674 11/03/2017   TSH 0.33 (L) 04/22/2009    Lab Results  Component Value Date   THYROTRECAB 72.30 (H) 02/11/2019     Allergies as of 03/18/2019      Reactions   Celery Oil Anaphylaxis   Shellfish Allergy Anaphylaxis   Hydrocodone       Medication List       Accurate as of March 17, 2019  8:48 PM. If you have any questions, ask your nurse or doctor.        aspirin-acetaminophen-caffeine 250-250-65 MG tablet Commonly known as: EXCEDRIN MIGRAINE Take 2 tablets by mouth every 6 (six) hours as needed for headache.   furosemide 40 MG tablet Commonly known as: LASIX Take 1 tablet (40 mg total) by mouth daily. What changed:   when to take this  reasons to take this   hydrOXYzine 25 MG tablet Commonly known as: ATARAX/VISTARIL Take 1 tablet (25 mg total) by mouth 2 (two) times daily as needed for itching. What changed: when to take this   lisinopril 10  MG tablet Commonly known as: ZESTRIL Take 1 tablet by mouth once daily   methimazole 10 MG tablet Commonly known as: TAPAZOLE Take 1 tablet (10 mg total) by mouth 2 (two) times daily. What changed:   how much to take  additional instructions   metoprolol succinate 50 MG 24 hr tablet Commonly known as: TOPROL-XL 1 1/2 TAB AT BEDTIME   nitroGLYCERIN 0.4 MG SL tablet Commonly known as: NITROSTAT Place 1 tablet (0.4 mg total) under the tongue every 5 (five) minutes as needed for chest pain. WHILE SITTING   triamcinolone cream 0.1 % Commonly known as: KENALOG Apply 1 application topically 2 (two) times daily.           Past Medical History:  Diagnosis Date  . Cardiomyopathy   . HTN (hypertension)   . Mitral regurgitation     No past surgical history on file.  Family History  Problem Relation Age of Onset  . Heart disease Mother        No details  . Hypertension Mother   . Diabetes Mother   . Hypertension Father   . Heart murmur Sister     Social History:  reports that she has quit smoking. She has never used smokeless tobacco. She reports current alcohol use of about 1.0 standard drinks of alcohol per week. She reports that she does not use drugs.  Allergies:  Allergies  Allergen Reactions  . Celery Oil Anaphylaxis  . Shellfish Allergy Anaphylaxis  . Hydrocodone      Review of Systems  Last childbirth 6 years ago  She says she has had some rash on her chest area which she feels is better with using a charcoal bar   Examination:   There were no vitals taken for this visit.  Exam not done, patient is being seen virtually    Assessment/Plan:   Hyperthyroidism, appears to be secondary to Graves' disease   She has had significant baseline symptoms  Her symptoms of hyperthyroidism are resolving She has nonspecific symptoms of right hand shakiness, some intermittent diarrhea which is unlikely to be thyroid related  Although her thyroid levels are  quite normal her weight appears to be going up slightly and she may be having mild cold intolerance recently Thyroid levels are likely improving further  Again discussed natural history of Graves' disease,  consideration of I-131 as a possible treatment since she has severe hyperthyroidism to start with  For now she will continue on methimazole and go back to 10 mg twice daily with dropping the extra half tablet  RASH: This appears to be unrelated to hyperthyroidism and she can follow-up for this and her diarrhea symptoms with PCP    Reather LittlerAjay Erica Richwine 03/17/2019, 8:48 PM    Note: This office note was prepared with Dragon voice recognition system technology. Any transcriptional errors that result from this process are unintentional.

## 2019-03-18 ENCOUNTER — Ambulatory Visit (INDEPENDENT_AMBULATORY_CARE_PROVIDER_SITE_OTHER): Payer: Medicaid Other | Admitting: Endocrinology

## 2019-03-18 ENCOUNTER — Encounter: Payer: Self-pay | Admitting: Endocrinology

## 2019-03-18 ENCOUNTER — Other Ambulatory Visit: Payer: Self-pay

## 2019-03-18 DIAGNOSIS — E0590 Thyrotoxicosis, unspecified without thyrotoxic crisis or storm: Secondary | ICD-10-CM

## 2019-03-18 MED ORDER — METHIMAZOLE 10 MG PO TABS: 10.0000 mg | ORAL_TABLET | Freq: Two times a day (BID) | ORAL | 1 refills | Status: DC

## 2019-04-09 DIAGNOSIS — Z3046 Encounter for surveillance of implantable subdermal contraceptive: Secondary | ICD-10-CM | POA: Diagnosis not present

## 2019-04-09 DIAGNOSIS — Z3202 Encounter for pregnancy test, result negative: Secondary | ICD-10-CM | POA: Diagnosis not present

## 2019-04-09 DIAGNOSIS — Z114 Encounter for screening for human immunodeficiency virus [HIV]: Secondary | ICD-10-CM | POA: Diagnosis not present

## 2019-04-09 DIAGNOSIS — Z113 Encounter for screening for infections with a predominantly sexual mode of transmission: Secondary | ICD-10-CM | POA: Diagnosis not present

## 2019-04-19 ENCOUNTER — Other Ambulatory Visit: Payer: Self-pay | Admitting: Cardiology

## 2019-04-30 ENCOUNTER — Other Ambulatory Visit: Payer: Medicaid Other

## 2019-05-03 ENCOUNTER — Ambulatory Visit: Payer: Medicaid Other | Admitting: Endocrinology

## 2019-05-21 ENCOUNTER — Telehealth (HOSPITAL_COMMUNITY): Payer: Self-pay

## 2019-05-21 NOTE — Telephone Encounter (Signed)
Tried calling patient to go over covid pre screen questions for tomorrow since she has an appointment, no answer,left message to return call

## 2019-05-22 ENCOUNTER — Encounter (HOSPITAL_COMMUNITY): Payer: Self-pay | Admitting: Internal Medicine

## 2019-05-22 ENCOUNTER — Ambulatory Visit (HOSPITAL_COMMUNITY)
Admission: RE | Admit: 2019-05-22 | Discharge: 2019-05-22 | Disposition: A | Discharge status: Home / Self Care | Payer: Medicaid Other | Source: Ambulatory Visit | Attending: Internal Medicine | Admitting: Internal Medicine

## 2019-05-22 ENCOUNTER — Other Ambulatory Visit: Payer: Self-pay

## 2019-05-22 VITALS — BP 148/101 | HR 86 | Wt 231.0 lb

## 2019-05-22 DIAGNOSIS — Z8249 Family history of ischemic heart disease and other diseases of the circulatory system: Secondary | ICD-10-CM | POA: Insufficient documentation

## 2019-05-22 DIAGNOSIS — I5022 Chronic systolic (congestive) heart failure: Secondary | ICD-10-CM | POA: Diagnosis not present

## 2019-05-22 DIAGNOSIS — I10 Essential (primary) hypertension: Secondary | ICD-10-CM

## 2019-05-22 DIAGNOSIS — E0590 Thyrotoxicosis, unspecified without thyrotoxic crisis or storm: Secondary | ICD-10-CM

## 2019-05-22 DIAGNOSIS — I428 Other cardiomyopathies: Secondary | ICD-10-CM | POA: Diagnosis not present

## 2019-05-22 DIAGNOSIS — R55 Syncope and collapse: Secondary | ICD-10-CM

## 2019-05-22 DIAGNOSIS — R9431 Abnormal electrocardiogram [ECG] [EKG]: Secondary | ICD-10-CM | POA: Diagnosis not present

## 2019-05-22 DIAGNOSIS — E669 Obesity, unspecified: Secondary | ICD-10-CM | POA: Diagnosis not present

## 2019-05-22 DIAGNOSIS — Z79899 Other long term (current) drug therapy: Secondary | ICD-10-CM | POA: Insufficient documentation

## 2019-05-22 DIAGNOSIS — I340 Nonrheumatic mitral (valve) insufficiency: Secondary | ICD-10-CM

## 2019-05-22 DIAGNOSIS — Z833 Family history of diabetes mellitus: Secondary | ICD-10-CM | POA: Diagnosis not present

## 2019-05-22 DIAGNOSIS — Z87891 Personal history of nicotine dependence: Secondary | ICD-10-CM | POA: Diagnosis not present

## 2019-05-22 DIAGNOSIS — I110 Hypertensive heart disease with heart failure: Secondary | ICD-10-CM | POA: Insufficient documentation

## 2019-05-22 DIAGNOSIS — R0683 Snoring: Secondary | ICD-10-CM | POA: Diagnosis not present

## 2019-05-22 LAB — BASIC METABOLIC PANEL
Anion gap: 8 (ref 5–15)
BUN: 11 mg/dL (ref 6–20)
CO2: 18 mmol/L — ABNORMAL LOW (ref 22–32)
Calcium: 8.7 mg/dL — ABNORMAL LOW (ref 8.9–10.3)
Chloride: 110 mmol/L (ref 98–111)
Creatinine, Ser: 0.77 mg/dL (ref 0.44–1.00)
GFR calc Af Amer: >60 mL/min (ref >60)
GFR calc non Af Amer: >60 mL/min (ref >60)
Glucose, Bld: 97 mg/dL (ref 70–99)
Potassium: 4.1 mmol/L (ref 3.5–5.1)
Sodium: 136 mmol/L (ref 135–145)

## 2019-05-22 LAB — BRAIN NATRIURETIC PEPTIDE: B Natriuretic Peptide: 145.5 pg/mL — ABNORMAL HIGH (ref 0.0–100.0)

## 2019-05-22 MED ORDER — ENTRESTO 24-26 MG PO TABS: 1.0000 | ORAL_TABLET | Freq: Two times a day (BID) | ORAL | 6 refills | Status: DC

## 2019-05-22 NOTE — Patient Instructions (Signed)
STOP Lisinopril  Start Entresto 24/26 mg Twice daily STARTING ON Sunday 11/29  Your provider has recommended that  you wear a Zio Patch for 14 days.  This monitor will record your heart rhythm for our review.  IF you have any symptoms while wearing the monitor please press the button.  If you have any issues with the patch or you notice a red or orange light on it please call the company at [phone].  Once you remove the patch please mail it back to the company as soon as possible so we can get the results.  Labs done today, we will notify you of abnormal results  Your physician recommends that you schedule a follow-up appointment in: 3 months with echocardiogram  If you have any questions or concerns before your next appointment please send [place] a message through [place] or call our office at [phone].  At the [facility], you and your health needs are our priority. As part of our continuing mission to provide you with exceptional heart care, we have created designated Provider Care Teams. These Care Teams include your primary Cardiologist (physician) and Advanced Practice Providers (APPs- Physician Assistants and Nurse Practitioners) who all work together to provide you with the care you need, when you need it.   You may see any of the following providers on your designated Care Team at your next follow up: Marland Kitchen Dr Glori Bickers . Dr Loralie Champagne . Darrick Grinder, NP . Lyda Jester, PA   Please be sure to bring in all your medications bottles to every appointment.

## 2019-05-22 NOTE — Addendum Note (Signed)
Encounter addended by: Scarlette Calico, RN on: 05/22/2019 12:40 PM  Actions taken: Clinical Note Signed

## 2019-05-22 NOTE — Progress Notes (Signed)
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 Hyperthyroidism..   She developed HF after the birth of her 4th child in 2010. She was in [place] at the time. Saw Dr. Antoine Poche EF 20%. Did not have a heart cath. She then moved back to [place] to be by her mother. She was followed at [place] by Dr. Fanny Bien. She had a high-risk pregnancy and had her 5th  Child in 85. Apparently EF dropped to 15-20% for a period. They suggested ICD but she refused. She returned to [place].  In July 2019 she was diagnosed with hyperthyroidism. Started on methimazole bu Dr Lucianne Muss.   Today she returns for HF follow up. Says she is feeling worse. More SOB and more CP. Gets SOB with all ADLs. Says she also getting CP now every day. Pain can occur at rest or ambulation. Describes as sharp pain in left chest radiating to left arm.  Also has had several episodes of pre-syncope. Says is in October was sitting on bed and felt it coming on. Says she could see and hear everything but just leaned over to her side. No frank syncope. No palpitations. About 3 weeks ago had a similar episode but not as severe. Hands swell some. No LE edema. Kids says she snores occasionally but it is better. Weight up about 20 pounds.   Echo 12/2018 EF 35-40% Mod-Severe MR, RV normal Echo 11/16/2017 EF 20-25% Severe MR    ROS: All systems negative except as listed in HPI, PMH and Problem List.  SH:  Social History   Socioeconomic History  . Marital status: Single    Spouse name: Not on file  . Number of children: Not on file  . Years of education: Not on file  . Highest education level: Not on file  Occupational History  . Not on file  Social Needs  . Financial resource strain: Not on file  . Food insecurity    Worry: Not on file    Inability: Not on file  . Transportation  needs    Medical: Not on file    Non-medical: Not on file  Tobacco Use  . Smoking status: Former Games developer  . Smokeless tobacco: Never Used  Substance and Sexual Activity  . Alcohol use: Yes    Alcohol/week: 1.0 standard drinks    Types: 1 Glasses of wine per week  . Drug use: Never  . Sexual activity: Not on file  Lifestyle  . Physical activity    Days per week: Not on file    Minutes per session: Not on file  . Stress: Not on file  Relationships  . Social Musician on phone: Not on file    Gets together: Not on file    Attends religious service: Not on file    Active member of club or organization: Not on file    Attends meetings of clubs or organizations: Not on file    Relationship status: Not on file  . Intimate partner violence    Fear of current or ex partner: Not on file    Emotionally abused: Not on file    Physically abused: Not on file    Forced sexual activity: Not on file  Other Topics Concern  . Not on file  Social History Narrative   She has 4 children ages 18  and under.  She stays at home. She does not smoke cigarettes or drink alcohol. Does not get regular exercise.     FH:  Family History  Problem Relation Age of Onset  . Heart disease Mother        No details  . Hypertension Mother   . Diabetes Mother   . Hypertension Father   . Heart murmur Sister     Past Medical History:  Diagnosis Date  . Cardiomyopathy   . HTN (hypertension)   . Mitral regurgitation     Current Outpatient Medications  Medication Sig Dispense Refill  . aspirin-acetaminophen-caffeine (EXCEDRIN MIGRAINE) 250-250-65 MG tablet Take 2 tablets by mouth every 6 (six) hours as needed for headache.    . furosemide (LASIX) 40 MG tablet Take 40 mg by mouth as needed for fluid or edema.    . hydrOXYzine (ATARAX/VISTARIL) 25 MG tablet Take 25 mg by mouth 2 (two) times daily as needed for itching.    Marland Kitchen lisinopril (ZESTRIL) 10 MG tablet Take 1 tablet by mouth once daily 90  tablet 3  . methimazole (TAPAZOLE) 10 MG tablet Take 1 tablet (10 mg total) by mouth 2 (two) times daily. 60 tablet 1  . metoprolol succinate (TOPROL-XL) 50 MG 24 hr tablet Take 50 mg by mouth 2 (two) times daily. Take with or immediately following a meal.     No current facility-administered medications for this encounter.     Vitals:   05/22/19 1018  BP: (!) 148/101  Pulse: 86  SpO2: 100%  Weight: 104.8 kg (231 lb)   Wt Readings from Last 3 Encounters:  05/22/19 104.8 kg (231 lb)  02/19/19 93 kg (205 lb)  02/11/19 92 kg (202 lb 12.8 oz)   PHYSICAL EXAM: General:  Well appearing. No resp difficulty HEENT: normal Neck: supple. no JVD. Carotids 2+ bilat; no bruits. No lymphadenopathy appreciated. + Thryomegaly Cor: PMI nondisplaced. Regular rate & rhythm. No rubs, gallops or murmurs. Lungs: clear Abdomen: obese soft, nontender, nondistended. No hepatosplenomegaly. No bruits or masses. Good bowel sounds. Extremities: no cyanosis, clubbing, rash, edema Neuro: alert & orientedx3, cranial nerves grossly intact. moves all 4 extremities w/o difficulty. Affect pleasant    ASSESSMENT & PLAN: 1. Chronic systolic HF - Onset in 3235. 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 No cath or cMRI yet - Echo 5/19 EF 20-25% with severe posterior MR.   - ECHO 12/2018 EF 35-40% mod-severe MR with restricted posterior leaflet  - Symptomatically worse NYHA III-IIIB. With persistent LV dysfunction, worsening DOE and CP, I recommended proceeding with R/L cath to further evaluate. She says she wants to wait 3 months to try to lose the weight she gained after her thyroid treatment to see if that helps. We discussed the risks of waiting including SCD. - Continue Toprol 50mg  twice a day.  - Intolerant spiro /entresto due to nausea/vomiting.  Will retry Entresto 24/26 bid. Stop lisinopril 10 daily with 36 hour washout. `  - Consider SGLT2i at some point  - EF now out of range for ICD. She has refused device implantation in  past as well.   2. Severe mitral regurgitation - felt to be functional MR but PMV leaflet is restricted as well.  - will repeat echo in 3 months. If unchanged will need TEE  3. Heavy snoring/probable OSA - PSG 8/1 AHI 0.6  4. Hyperthyroidism - On methimazole and followed by Dr Dwyane Dee - planning I-131 ablation in future. Will need to do prior to cath so as not  to saturate thyroid cells with iodine  5. Syncope - place Zio  6. HTN - BP elevated. changing to [facility] as above  7.Obesity - she is trying to lose weight   Total time spent 35 minutes. Over half that time spent discussing above.    Arvilla Meres MD 10:33 AM

## 2019-05-22 NOTE — Progress Notes (Signed)
Zio patch placed onto patient by Philicia Branch, CMA.  All instructions and information reviewed with patient, they verbalize understanding with no questions.  

## 2019-05-27 ENCOUNTER — Telehealth (HOSPITAL_COMMUNITY): Payer: Self-pay | Admitting: Pharmacy Technician

## 2019-05-27 NOTE — Telephone Encounter (Signed)
Patient Advocate Encounter   Received notification from Medicaid that prior authorization for Delene Loll is required.   PA submitted on [employer] [id number] W Status is pending   Will continue to follow.  Charlann Boxer, CPhT

## 2019-05-29 ENCOUNTER — Other Ambulatory Visit: Payer: Medicaid Other

## 2019-05-29 NOTE — Telephone Encounter (Signed)
Advanced Heart Failure Patient Advocate Encounter  Prior Authorization for Delene Loll has been approved.    PA# [id number] Effective dates: 05/27/2019 through 05/21/2020  Patients co-pay is $3.00  Called and confirmed that patient's claim went through successfully.  Charlann Boxer, CPhT

## 2019-06-03 ENCOUNTER — Other Ambulatory Visit: Payer: Self-pay | Admitting: Adult Health

## 2019-06-04 ENCOUNTER — Encounter: Payer: Self-pay | Admitting: Endocrinology

## 2019-06-04 ENCOUNTER — Telehealth: Payer: Self-pay

## 2019-06-04 ENCOUNTER — Encounter: Payer: Medicaid Other | Admitting: Endocrinology

## 2019-06-04 ENCOUNTER — Other Ambulatory Visit: Payer: Self-pay

## 2019-06-04 NOTE — Telephone Encounter (Signed)
-----   Message from Elayne Snare, MD sent at 06/04/2019  8:41 AM EST ----- Regarding: No labs done, reschedule Please call to have her come in for labs before visit, she is scheduled for 915 today

## 2019-06-04 NOTE — Telephone Encounter (Signed)
Call patient and made her aware of Dr notes about labs she states she has told us this before she can not do labs and then a visit the next week because of transportation. I tried to see if she had a [facility] near her she does not. She says she can only do same day labs with her visit

## 2019-06-06 NOTE — Progress Notes (Signed)
This encounter was created in error - please disregard.

## 2019-06-26 ENCOUNTER — Other Ambulatory Visit: Payer: Self-pay

## 2019-06-26 ENCOUNTER — Encounter: Payer: Self-pay | Admitting: Endocrinology

## 2019-06-26 ENCOUNTER — Ambulatory Visit (INDEPENDENT_AMBULATORY_CARE_PROVIDER_SITE_OTHER): Payer: Medicaid Other | Admitting: Endocrinology

## 2019-06-26 ENCOUNTER — Other Ambulatory Visit: Payer: Medicaid Other

## 2019-06-26 VITALS — BP 130/88 | HR 88 | Ht 67.0 in | Wt 239.4 lb

## 2019-06-26 DIAGNOSIS — E0590 Thyrotoxicosis, unspecified without thyrotoxic crisis or storm: Secondary | ICD-10-CM | POA: Diagnosis not present

## 2019-06-26 LAB — T3, FREE: T3, Free: 2.6 pg/mL (ref 2.3–4.2)

## 2019-06-26 LAB — T4, FREE: Free T4: 0.47 ng/dL — ABNORMAL LOW (ref 0.60–1.60)

## 2019-06-26 NOTE — Progress Notes (Signed)
Patient ID: Sarah Reilly, female   DOB: 25-Jan-1981, 38 y.o.   MRN: [id number]                                                                                                                 Reason for Appointment:  Hyperthyroidism, follow-up    Referring healthcare provider: Dr.Newlin   Chief complaint: Follow-up of thyroid   History of Present Illness:   History at initial consultation   For the last 4 months she was having symptoms of her heart beating fast despite her being on metoprolol for history of CHF Although she tends to feel hot chronically she was having more sweating in the last couple of months Also in the last 2 months she has had symptoms of her leg swelling, chest discomfort, cough and her legs getting weaker At times she will not be able to get up from a low seat because of leg weakness She did not start having any symptoms of shakiness of her hands She does not think she has lost any weight However her weight previously had been about 240 in 2019 Over the last month or so she has had a splotchy rash on her hands, knees and chest area She does have previous history of hives and she was also having some hives again recently Another symptom she has had recently has been loose frequent stools for about a month  When seen in her cardiology follow-up on 7/7 she was having symptoms of shortness of breath on exertion also along with some palpitations Thyroid levels were checked and showed significant hyperthyroidism   RECENT HISTORY:  On initial visit her methimazole was increased to 10 mg twice daily because of her high baseline thyroid levels Subsequently this was increased but she is now back on 10 mg twice daily since 9/20  She has not followed up as scheduled  She has various symptoms including weight gain, sometimes having palpitations and shakiness Also she is mostly feeling a little warm but not consistently She feels more tired and sometimes  sleepy Also says that she has sometimes a feeling of swelling in her throat and difficulty swallowing Does appear to be having a weight gain including the last month  She has taken her methimazole regularly   Labs pending, they were normal in 9/20  Wt Readings from Last 3 Encounters:  06/26/19 239 lb 6.4 oz (108.6 kg)  05/22/19 231 lb (104.8 kg)  02/19/19 205 lb (93 kg)     Thyroid function tests as follows:     Lab Results  Component Value Date   FREET4 0.86 03/14/2019   FREET4 1.26 02/11/2019   FREET4 >7.77 (H) 01/01/2019   T3FREE 3.7 03/14/2019   T3FREE 4.7 (H) 02/11/2019   TSH <0.006 (L) 01/01/2019   TSH 0.674 11/03/2017   TSH 0.33 (L) 04/22/2009    Lab Results  Component Value Date   THYROTRECAB 72.30 (H) 02/11/2019     Allergies as of 06/26/2019      Reactions  Celery Oil Anaphylaxis   Shellfish Allergy Anaphylaxis   Hydrocodone       Medication List       Accurate as of June 26, 2019  8:59 AM. If you have any questions, ask your nurse or doctor.        STOP taking these medications   Entresto 24-26 MG Generic drug: sacubitril-valsartan Stopped by: Reather Littler, MD   lisinopril 10 MG tablet Commonly known as: ZESTRIL Stopped by: Reather Littler, MD     TAKE these medications   aspirin-acetaminophen-caffeine [phone] MG tablet Commonly known as: EXCEDRIN MIGRAINE Take 2 tablets by mouth every 6 (six) hours as needed for headache.   furosemide 40 MG tablet Commonly known as: LASIX Take 40 mg by mouth as needed for fluid or edema.   hydrOXYzine 25 MG tablet Commonly known as: ATARAX/VISTARIL Take 25 mg by mouth 2 (two) times daily as needed for itching.   methimazole 10 MG tablet Commonly known as: TAPAZOLE Take 1 tablet (10 mg total) by mouth 2 (two) times daily.   metoprolol succinate 50 MG 24 hr tablet Commonly known as: TOPROL-XL Take 50 mg by mouth 2 (two) times daily. Take 1 tablet by mouth twice daily. What changed: Another  medication with the same name was removed. Continue taking this medication, and follow the directions you see here. Changed by: Reather Littler, MD           Past Medical History:  Diagnosis Date  . Cardiomyopathy   . HTN (hypertension)   . Mitral regurgitation     History reviewed. No pertinent surgical history.  Family History  Problem Relation Age of Onset  . Heart disease Mother        No details  . Hypertension Mother   . Diabetes Mother   . Hypertension Father   . Heart murmur Sister     Social History:  reports that she has quit smoking. She has never used smokeless tobacco. She reports current alcohol use of about 1.0 standard drinks of alcohol per week. She reports that she does not use drugs.  Allergies:  Allergies  Allergen Reactions  . Celery Oil Anaphylaxis  . Shellfish Allergy Anaphylaxis  . Hydrocodone      Review of Systems  Constitutional: Positive for weight gain.  HENT: Positive for trouble swallowing.   Respiratory: Positive for daytime sleepiness.   Cardiovascular: Positive for palpitations.  Endocrine: Positive for fatigue and heat intolerance.  Neurological: Positive for tremors.       Examination:   BP 130/88 (BP Location: Left Arm, Patient Position: Sitting, Cuff Size: Large)   Pulse 88   Ht 5\' 7"  (1.702 m)   Wt 239 lb 6.4 oz (108.6 kg)   SpO2 98%   BMI 37.50 kg/m   Thyroid is enlarged bilaterally, more on the right side and about 3 times normal No significant tremor Reflexes difficult to elicit but appear normal Hands are not unusually warm or diaphoretic    Assessment/Plan:   Hyperthyroidism, secondary to Graves' disease   She has had significant baseline symptoms  Her symptoms are difficult to assess as she has a variety of symptoms Initially at presentation she did not have any weight loss but now appearing to have weight gain However she also has fatigue and shakiness as well as some palpitations Heart rate is slightly  higher today Still has significant goiter  Labs to be checked today to assess thyroid functions, receptor antibody and further treatment will be decided based  on this  Discussed possibility of doing I-131 if she continues to require relatively large doses of methimazole and also thyrotropin receptor antibody is significantly high on today's lab work    Reather Littler 06/26/2019, 8:59 AM    Note: This office note was prepared with Dragon voice recognition system technology. Any transcriptional errors that result from this process are unintentional.

## 2019-06-27 LAB — TSH: TSH: 39.14 mIU/L — ABNORMAL HIGH

## 2019-06-27 LAB — THYROTROPIN RECEPTOR AUTOABS: Thyrotropin Receptor Ab: 14.8 IU/L — ABNORMAL HIGH (ref 0.00–1.75)

## 2019-07-01 ENCOUNTER — Other Ambulatory Visit: Payer: Self-pay

## 2019-08-04 ENCOUNTER — Other Ambulatory Visit: Payer: Self-pay | Admitting: Endocrinology

## 2019-08-04 DIAGNOSIS — E0590 Thyrotoxicosis, unspecified without thyrotoxic crisis or storm: Secondary | ICD-10-CM

## 2019-08-06 ENCOUNTER — Other Ambulatory Visit: Payer: Medicaid Other

## 2019-08-09 ENCOUNTER — Ambulatory Visit: Payer: Medicaid Other | Admitting: Endocrinology

## 2019-08-12 IMAGING — CT CT ANGIOGRAPHY CHEST
2 of 10 series · 17 of 46 positions shown · IV contrast (omnipaque)
Comparison: Same-day chest radiographs

CLINICAL DATA: Chest pressure

EXAM:
CT ANGIOGRAPHY CHEST WITH CONTRAST
TECHNIQUE: Multidetector CT imaging of the chest was performed using the
standard protocol during bolus administration of intravenous
contrast. Multiplanar CT image reconstructions and MIPs were
obtained to evaluate the vascular anatomy.
CONTRAST:  100mL OMNIPAQUE IOHEXOL 350 MG/ML SOLN

[Series 8: thins · axial · 0.73mm/px · z∈[+1154,+1366]mm · 14 of 236 slices shown]
[im 12/236  lung]
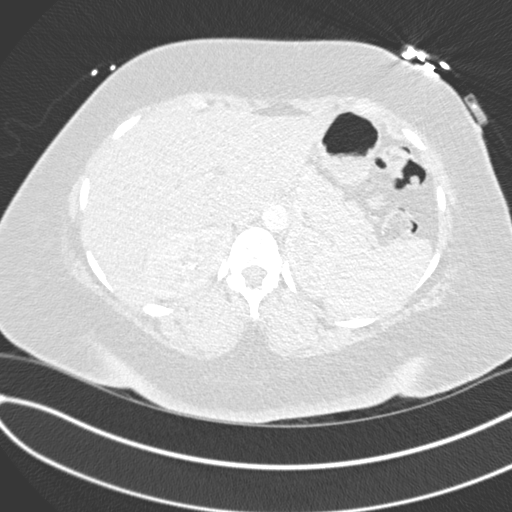
[im 36/236  soft-tissue]
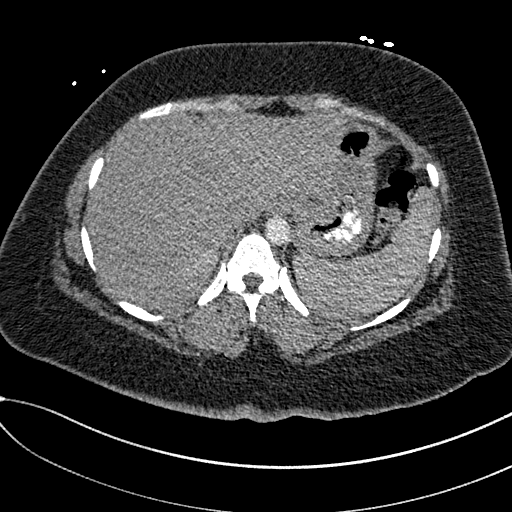
[im 48/236  lung]
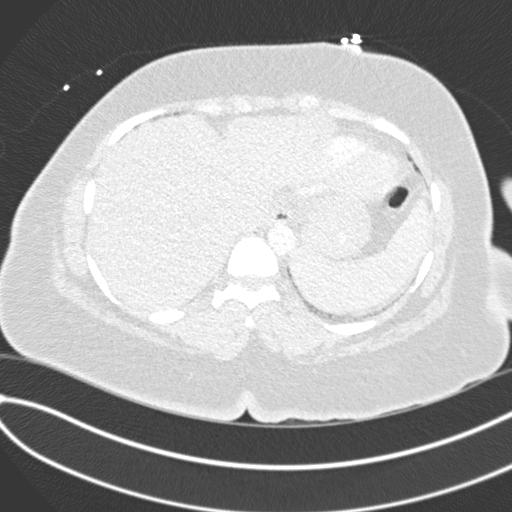
[im 59/236  soft-tissue]
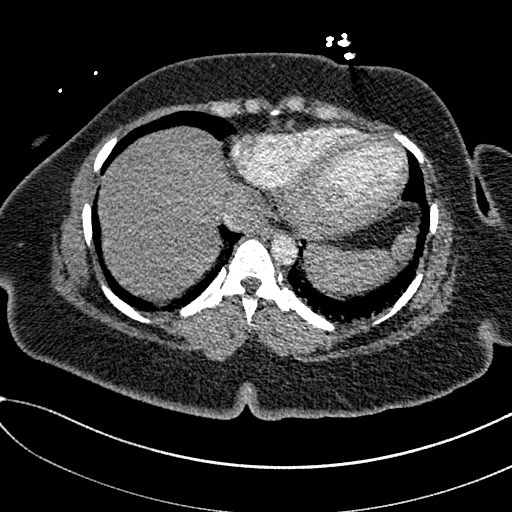
[im 83/236  lung]
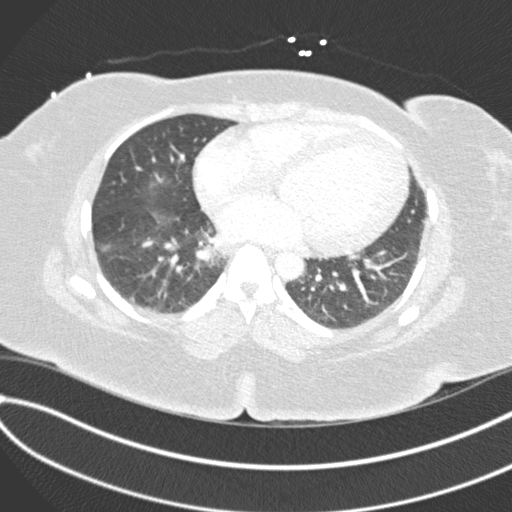
[im 95/236  soft-tissue]
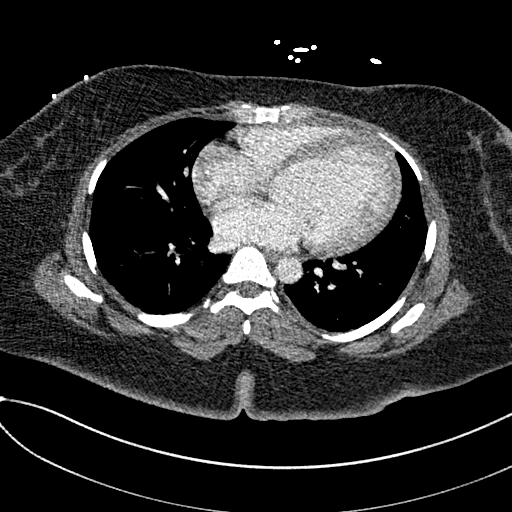
[im 106/236  lung]
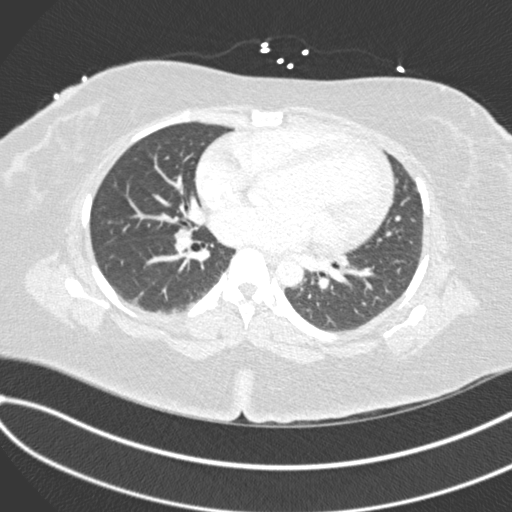
[im 130/236  soft-tissue]
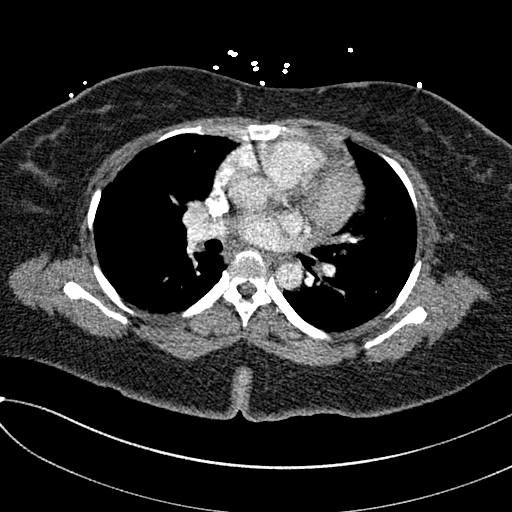
[im 142/236  lung]
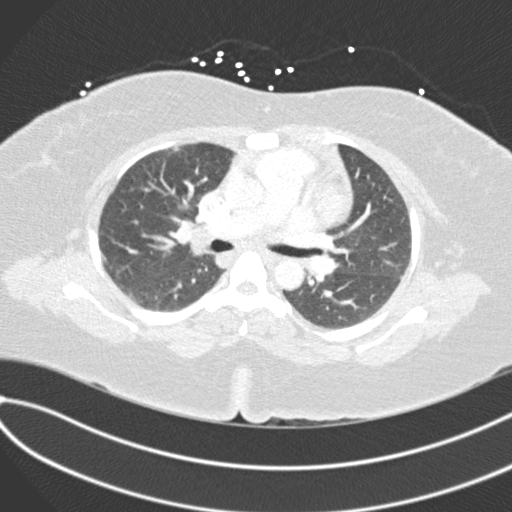
[im 153/236  soft-tissue]
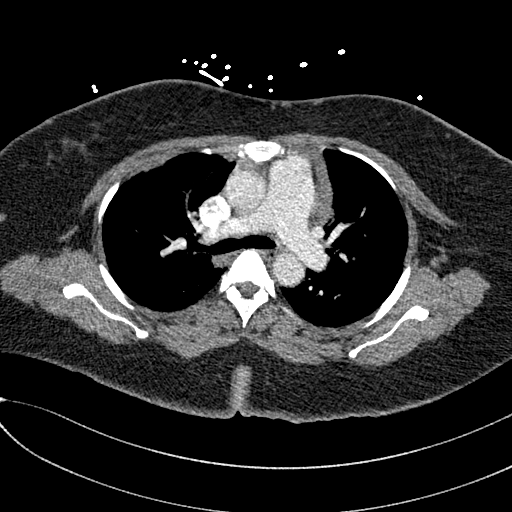
[im 177/236  lung]
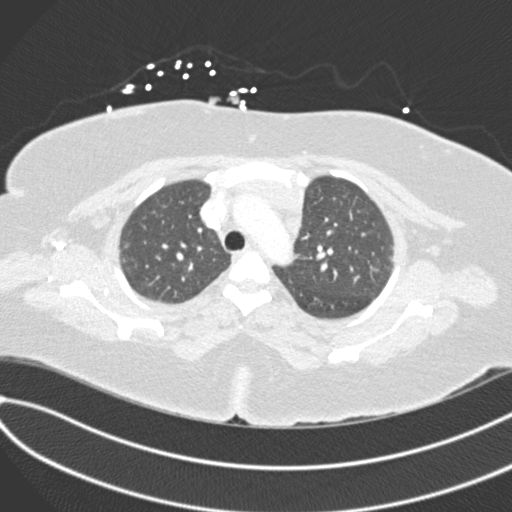
[im 189/236  soft-tissue]
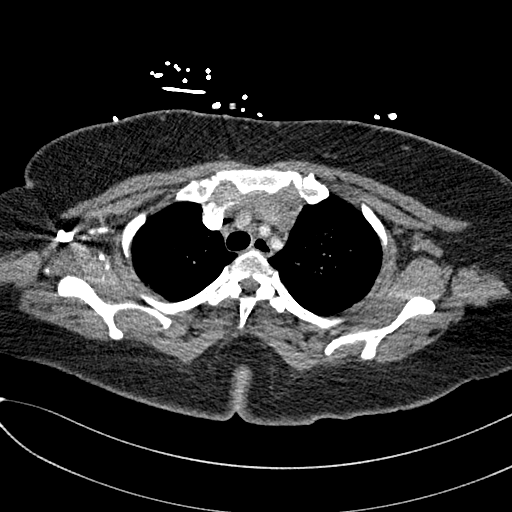
[im 200/236  lung]
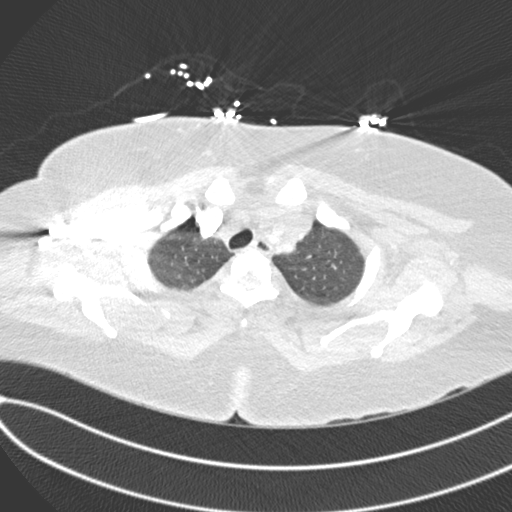
[im 224/236  soft-tissue]
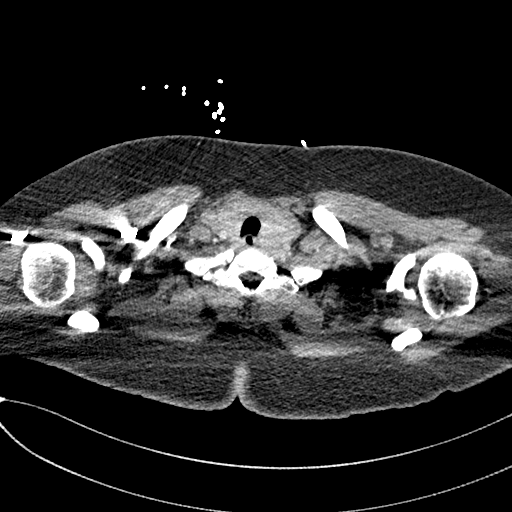

[Series 10: coronal mpr · coronal · 0.48mm/px · 3 of 154 slices shown]
[im 39/154  soft-tissue]
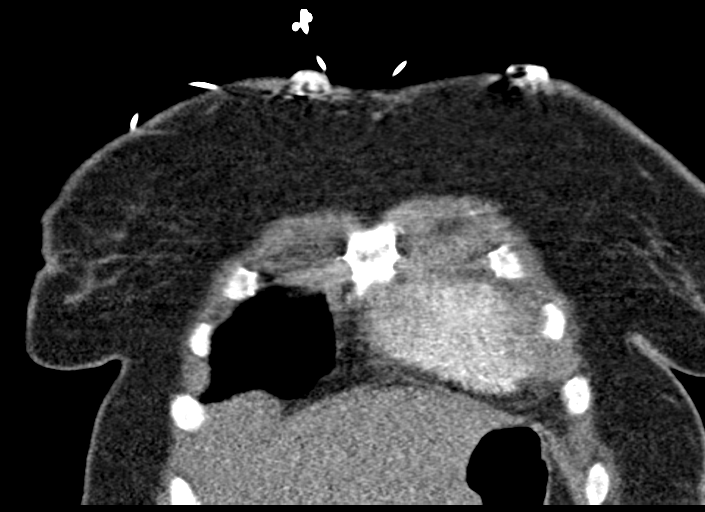
[im 77/154  soft-tissue]
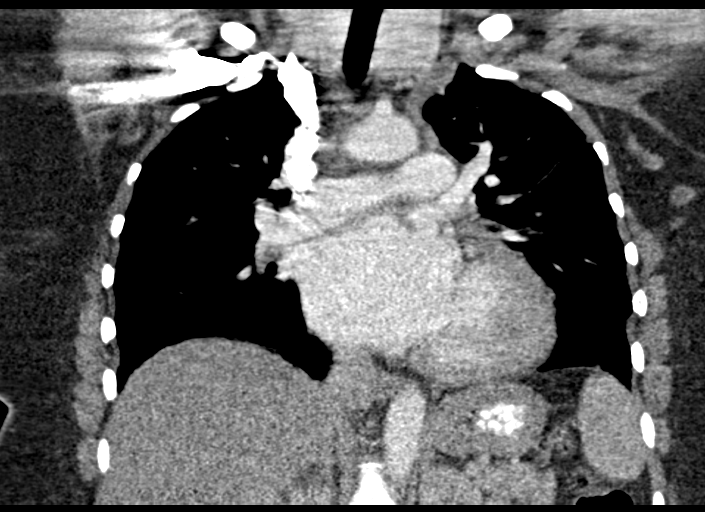
[im 115/154  soft-tissue]
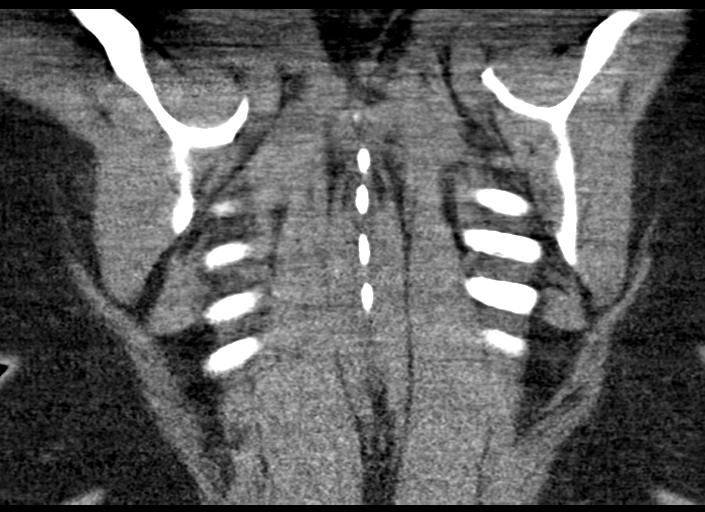

[17 of 46 positions shown; findings below may reference images not displayed]

FINDINGS: Cardiovascular: Examination for pulmonary embolism is significantly
limited by poor contrast bolus, main pulmonary artery HU = 168.
Within this limitation, no evidence of lobar or proximal segmental
pulmonary embolism. The distal vessels are inadequately evaluated.
Cardiomegaly. No pericardial effusion.

Mediastinum/Nodes: No enlarged mediastinal, hilar, or axillary lymph
nodes. Thyroid gland, trachea, and esophagus demonstrate no
significant findings.

Lungs/Pleura: There are scattered subpleural ground-glass pulmonary
opacities bilaterally (e.g. Series 9, image 36). No pleural effusion
or pneumothorax.

Upper Abdomen: No acute abnormality.

Musculoskeletal: No chest wall abnormality. No acute or significant
osseous findings.

Review of the MIP images confirms the above findings.
IMPRESSION: 1. Examination for pulmonary embolism is significantly limited by
poor contrast bolus, main pulmonary artery HU = 168. Within this
limitation, no evidence of lobar or proximal segmental pulmonary
embolism. The distal vessels are inadequately evaluated.

2. There are scattered, nonspecific infectious or inflammatory
subpleural ground-glass pulmonary opacities bilaterally (e.g. Series
9, image 36). There are a spectrum of findings in the lungs which
can be seen with acute atypical infection (as well as other
non-infectious etiologies). In particular, viral pneumonia
(including GKNBG-[id number]) should be considered in the appropriate
clinical setting.

3.  Cardiomegaly.

## 2019-08-22 ENCOUNTER — Encounter (HOSPITAL_COMMUNITY): Payer: Self-pay | Admitting: Internal Medicine

## 2019-08-22 ENCOUNTER — Ambulatory Visit (HOSPITAL_COMMUNITY)
Admission: RE | Admit: 2019-08-22 | Discharge: 2019-08-22 | Disposition: A | Discharge status: Home / Self Care | Payer: Medicaid Other | Source: Ambulatory Visit | Attending: Internal Medicine | Admitting: Internal Medicine

## 2019-08-22 ENCOUNTER — Ambulatory Visit (HOSPITAL_BASED_OUTPATIENT_CLINIC_OR_DEPARTMENT_OTHER)
Admission: RE | Admit: 2019-08-22 | Discharge: 2019-08-22 | Disposition: A | Discharge status: Home / Self Care | Payer: Medicaid Other | Source: Ambulatory Visit | Attending: Internal Medicine | Admitting: Internal Medicine

## 2019-08-22 ENCOUNTER — Other Ambulatory Visit: Payer: Self-pay

## 2019-08-22 ENCOUNTER — Other Ambulatory Visit (HOSPITAL_COMMUNITY): Payer: Self-pay

## 2019-08-22 VITALS — BP 126/84 | HR 94 | Wt 244.0 lb

## 2019-08-22 DIAGNOSIS — Z79899 Other long term (current) drug therapy: Secondary | ICD-10-CM | POA: Diagnosis not present

## 2019-08-22 DIAGNOSIS — I340 Nonrheumatic mitral (valve) insufficiency: Secondary | ICD-10-CM | POA: Diagnosis not present

## 2019-08-22 DIAGNOSIS — E0590 Thyrotoxicosis, unspecified without thyrotoxic crisis or storm: Secondary | ICD-10-CM

## 2019-08-22 DIAGNOSIS — E669 Obesity, unspecified: Secondary | ICD-10-CM | POA: Insufficient documentation

## 2019-08-22 DIAGNOSIS — I5022 Chronic systolic (congestive) heart failure: Secondary | ICD-10-CM | POA: Insufficient documentation

## 2019-08-22 DIAGNOSIS — R0683 Snoring: Secondary | ICD-10-CM | POA: Insufficient documentation

## 2019-08-22 DIAGNOSIS — Z8249 Family history of ischemic heart disease and other diseases of the circulatory system: Secondary | ICD-10-CM | POA: Insufficient documentation

## 2019-08-22 DIAGNOSIS — I110 Hypertensive heart disease with heart failure: Secondary | ICD-10-CM | POA: Insufficient documentation

## 2019-08-22 DIAGNOSIS — I429 Cardiomyopathy, unspecified: Secondary | ICD-10-CM | POA: Insufficient documentation

## 2019-08-22 DIAGNOSIS — O903 Peripartum cardiomyopathy: Secondary | ICD-10-CM

## 2019-08-22 DIAGNOSIS — Z87891 Personal history of nicotine dependence: Secondary | ICD-10-CM | POA: Insufficient documentation

## 2019-08-22 LAB — TSH: TSH: 0.013 u[IU]/mL — ABNORMAL LOW (ref 0.350–4.500)

## 2019-08-22 LAB — COMPREHENSIVE METABOLIC PANEL
ALT: 32 U/L (ref 0–44)
AST: 25 U/L (ref 15–41)
Albumin: 3.7 g/dL (ref 3.5–5.0)
Alkaline Phosphatase: 71 U/L (ref 38–126)
Anion gap: 9 (ref 5–15)
BUN: 10 mg/dL (ref 6–20)
CO2: 18 mmol/L — ABNORMAL LOW (ref 22–32)
Calcium: 9.1 mg/dL (ref 8.9–10.3)
Chloride: 110 mmol/L (ref 98–111)
Creatinine, Ser: 0.58 mg/dL (ref 0.44–1.00)
GFR calc Af Amer: >60 mL/min (ref >60)
GFR calc non Af Amer: >60 mL/min (ref >60)
Glucose, Bld: 105 mg/dL — ABNORMAL HIGH (ref 70–99)
Potassium: 4.3 mmol/L (ref 3.5–5.1)
Sodium: 137 mmol/L (ref 135–145)
Total Bilirubin: 1 mg/dL (ref 0.3–1.2)
Total Protein: 6.9 g/dL (ref 6.5–8.1)

## 2019-08-22 LAB — CBC
HCT: 46.5 % — ABNORMAL HIGH (ref 36.0–46.0)
Hemoglobin: 14.2 g/dL (ref 12.0–15.0)
MCH: 27 pg (ref 26.0–34.0)
MCHC: 30.5 g/dL (ref 30.0–36.0)
MCV: 88.4 fL (ref 80.0–100.0)
Platelets: 202 10*3/uL (ref 150–400)
RBC: 5.26 MIL/uL — ABNORMAL HIGH (ref 3.87–5.11)
RDW: 11.9 % (ref 11.5–15.5)
WBC: 4 10*3/uL (ref 4.0–10.5)
nRBC: 0 % (ref 0.0–0.2)

## 2019-08-22 LAB — T4, FREE: Free T4: 2.05 ng/dL — ABNORMAL HIGH (ref 0.61–1.12)

## 2019-08-22 LAB — BRAIN NATRIURETIC PEPTIDE: B Natriuretic Peptide: 247.8 pg/mL — ABNORMAL HIGH (ref 0.0–100.0)

## 2019-08-22 MED ORDER — LISINOPRIL 20 MG PO TABS: 20.0000 mg | ORAL_TABLET | Freq: Every day | ORAL | 5 refills | Status: DC

## 2019-08-22 NOTE — Progress Notes (Signed)
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 Hyperthyroidism..  She developed HF after the birth of her 4th child in 2010. She was in [place] at the time. Saw Dr. Antoine Poche EF 20%. Did not have a heart cath. She then moved back to [place] to be by her mother. She was followed at [place] by Dr. Fanny Bien. She had a high-risk pregnancy and had her 5th  Child in 59. Apparently EF dropped to 15-20% for a period. They suggested ICD but she refused. She returned to [place].  In July 2019 she was diagnosed with hyperthyroidism. Started on methimazole bu Dr Lucianne Muss.   Today she returns for HF follow up. I saw her 3 months ago and was feeling much worse from HF perspective and also having CP in setting of 30 pound weight gain. I suggested R/L cath but she refused saying she wanted to trya nd lose weight first and see how she felt. Weight up another 5 pounds. But says she feels much better. Not as short of breath. Can do ADLs without SOB but gets SOB if she walks a block or two. Doesn't go up steps. No edema. No orthopnea or PND. Couldn't toelrate Entresto due to stomach issues. Now back on lisinopril.  Has seen Dr. Lucianne Muss in 12/20. Remains on methimazole. 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   Echo today LV dilated/spherical 30-35% likely severe MR. RV ok Personally reviewed  Echo 12/2018 EF 35-40% Mod-Severe MR, RV normal Echo 11/16/2017 EF 20-25% Severe MR    ROS: All systems negative except as listed in HPI, PMH and Problem List.  SH:  Social History   Socioeconomic History  . Marital status: Single    Spouse name: Not on file  . Number of children: Not on file  . Years of education: Not on file  . Highest education level: Not on file  Occupational History  . Not on file  Tobacco Use  .  Smoking status: Former Games developer  . Smokeless tobacco: Never Used  Substance and Sexual Activity  . Alcohol use: Yes    Alcohol/week: 1.0 standard drinks    Types: 1 Glasses of wine per week  . Drug use: Never  . Sexual activity: Not on file  Other Topics Concern  . Not on file  Social History Narrative   She has 4 children ages 40 and under.  She stays at home. She does not smoke cigarettes or drink alcohol. Does not get regular exercise.    Social Determinants of Health   Financial Resource Strain:   . Difficulty of Paying Living Expenses: Not on file  Food Insecurity:   . Worried About Programme researcher, broadcasting/film/video in the Last Year: Not on file  . Ran Out of Food in the Last Year: Not on file  Transportation Needs:   . Lack of Transportation (Medical): Not on file  . Lack of Transportation (Non-Medical): Not on file  Physical Activity:   . Days of Exercise per Week: Not on file  . Minutes of Exercise per Session: Not on file  Stress:   . Feeling of Stress : Not on file  Social Connections:   . Frequency of Communication with Friends and Family: Not on file  . Frequency of Social Gatherings with Friends and Family: Not on file  .  Attends Religious Services: Not on file  . Active Member of Clubs or Organizations: Not on file  . Attends Archivist Meetings: Not on file  . Marital Status: Not on file  Intimate Partner Violence:   . Fear of Current or Ex-Partner: Not on file  . Emotionally Abused: Not on file  . Physically Abused: Not on file  . Sexually Abused: Not on file    FH:  Family History  Problem Relation Age of Onset  . Heart disease Mother        No details  . Hypertension Mother   . Diabetes Mother   . Hypertension Father   . Heart murmur Sister     Past Medical History:  Diagnosis Date  . Cardiomyopathy   . HTN (hypertension)   . Mitral regurgitation     Current Outpatient Medications  Medication Sig Dispense Refill  .  aspirin-acetaminophen-caffeine (EXCEDRIN MIGRAINE) 250-250-65 MG tablet Take 2 tablets by mouth every 6 (six) hours as needed for headache.    . furosemide (LASIX) 40 MG tablet Take 40 mg by mouth as needed for fluid or edema.    . hydrOXYzine (ATARAX/VISTARIL) 25 MG tablet Take 25 mg by mouth 2 (two) times daily as needed for itching.    Marland Kitchen lisinopril (ZESTRIL) 10 MG tablet Take 10 mg by mouth daily.    . methimazole (TAPAZOLE) 10 MG tablet Take 1 tablet (10 mg total) by mouth 2 (two) times daily. 60 tablet 1  . metoprolol succinate (TOPROL-XL) 50 MG 24 hr tablet Take 50 mg by mouth 2 (two) times daily. Take with or immediately following a meal.     No current facility-administered medications for this encounter.    Vitals:   08/22/19 1020  BP: 126/84  Pulse: 94  SpO2: 98%  Weight: 110.7 kg (244 lb)   Wt Readings from Last 3 Encounters:  08/22/19 110.7 kg (244 lb)  06/26/19 108.6 kg (239 lb 6.4 oz)  05/22/19 104.8 kg (231 lb)   PHYSICAL EXAM: General:  Well appearing. No resp difficulty HEENT: normal Neck: supple. no JVD. Carotids 2+ bilat; no bruits. No lymphadenopathy. + thryomegaly Cor: PMI nondisplaced. Regular rate & rhythm. 2/6 TR and MR Lungs: clear Abdomen: obese soft, nontender, nondistended. No hepatosplenomegaly. No bruits or masses. Good bowel sounds. Extremities: no cyanosis, clubbing, rash, edema Neuro: alert & orientedx3, cranial nerves grossly intact. moves all 4 extremities w/o difficulty. Affect pleasant    ASSESSMENT & PLAN: 1. Chronic systolic HF - Onset in 0932. 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 No cath or cMRI yet - Echo 5/19 EF 20-25% with severe posterior MR.   - ECHO 12/2018 EF 35-40% mod-severe MR with restricted posterior leaflet  - Echo today EF 30-35% mod-severe MR RV ok  - Improved NYHA II-III. Volume status ok  - Continue Toprol 50mg  twice a day.  - Intolerant spiro /entresto due to nausea/vomiting (failed 2x).   - Increase lisinopril to 20mg  daily -  Consider SGLT2i soon  - EF borderline for ICD. Will continue to push medical therapy. She has refused device implantation in past as well.   2. Severe mitral regurgitation - felt to be functional MR but PMV leaflet is restricted as well.  - will plan TEE to further evaluate. Procedure discussed in detail   3. Heavy snoring/probable OSA - PSG 8/1 AHI 0.6  4. Hyperthyroidism - On methimazole and followed by Dr Dwyane Dee - considering I-131 ablation in future but she says she prefers to avoid  5.  Syncope - Zio results pending   6. HTN - BP improved   7.Obesity - continue weight loss efforts   Arvilla Meres MD 10:28 AM

## 2019-08-22 NOTE — Progress Notes (Signed)
  Echocardiogram 2D Echocardiogram has been performed.  Sarah Reilly 08/22/2019, 10:17 AM

## 2019-08-22 NOTE — Patient Instructions (Addendum)
INCREASE Lisinopril to 20mg  (1 tab) dailly  Labs today We will only contact you if something comes back abnormal or we need to make some changes. Otherwise no news is good news!  Your physician recommends that you schedule a follow-up appointment in: 3-4 months with Dr  Please call office at [phone] option 2 if you have any questions or concerns.   At the [facility], you and your health needs are our priority. As part of our continuing mission to provide you with exceptional heart care, we have created designated Provider Care Teams. These Care Teams include your primary Cardiologist (physician) and Advanced Practice Providers (APPs- Physician Assistants and Nurse Practitioners) who all work together to provide you with the care you need, when you need it.   You may see any of the following providers on your designated Care Team at your next follow up: [phone] Dr Marland Kitchen . Dr Arvilla Meres . Marca Ancona, NP . Tonye Becket, PA . Robbie Lis, PharmD   Please be sure to bring in all your medications bottles to every appointment.

## 2019-08-23 LAB — T3, FREE: T3, Free: 8 pg/mL — ABNORMAL HIGH (ref 2.0–4.4)

## 2019-09-03 NOTE — Addendum Note (Signed)
Encounter addended by: Crissie Figures, RN on: 09/03/2019 2:41 PM  Actions taken: Imaging Exam ended

## 2019-09-04 ENCOUNTER — Encounter: Payer: Medicaid Other | Admitting: Endocrinology

## 2019-09-05 NOTE — Progress Notes (Signed)
This encounter was created in error - please disregard.

## 2019-09-10 ENCOUNTER — Other Ambulatory Visit (HOSPITAL_COMMUNITY): Payer: Medicaid Other

## 2019-09-12 ENCOUNTER — Telehealth (HOSPITAL_COMMUNITY): Payer: Self-pay

## 2019-09-12 NOTE — Progress Notes (Signed)
Spoke with Jasmin at the [facility] regarding this patient's TEE scheduled for 09/13/19 to inform her that this patient has yet to get her COVID screening for the procedure tomorrow. I have tried multiple times to reach the patient but have been unable to do so. Per Jasmin, the clinic will reach out to the patient to get her appt rescheduled. Weston Settle, RN

## 2019-09-12 NOTE — Telephone Encounter (Signed)
Received voicemail from patient stating she wants to cancel appt (TEE) tomorrow.  No reasoning communicated.  Attempted to call patient back, no answer and unable to leave vm.

## 2019-09-13 ENCOUNTER — Encounter (HOSPITAL_COMMUNITY): Admission: RE | Payer: Self-pay | Source: Home / Self Care

## 2019-09-13 ENCOUNTER — Ambulatory Visit (HOSPITAL_COMMUNITY): Admission: RE | Admit: 2019-09-13 | Payer: Medicaid Other | Source: Home / Self Care | Admitting: Internal Medicine

## 2019-09-13 SURGERY — ECHOCARDIOGRAM, TRANSESOPHAGEAL
Anesthesia: Monitor Anesthesia Care

## 2019-10-31 ENCOUNTER — Other Ambulatory Visit: Payer: Self-pay | Admitting: Cardiology

## 2019-11-19 ENCOUNTER — Encounter (HOSPITAL_COMMUNITY): Payer: Medicaid Other | Admitting: Internal Medicine

## 2019-12-24 ENCOUNTER — Other Ambulatory Visit: Payer: Self-pay | Admitting: Cardiology

## 2019-12-29 ENCOUNTER — Other Ambulatory Visit: Payer: Self-pay | Admitting: Endocrinology

## 2020-01-01 ENCOUNTER — Other Ambulatory Visit: Payer: Self-pay | Admitting: Endocrinology

## 2020-01-01 NOTE — Telephone Encounter (Signed)
Noted  

## 2020-01-01 NOTE — Telephone Encounter (Signed)
Refuse with note to make appointment 

## 2020-01-01 NOTE — Telephone Encounter (Signed)
Pt was last seen in December of 2020, and instructed to f/u in February. Pt does not have existing appt. Would you like to refill or deny?

## 2020-02-10 ENCOUNTER — Other Ambulatory Visit: Payer: Self-pay

## 2020-02-10 ENCOUNTER — Ambulatory Visit (HOSPITAL_COMMUNITY)
Admission: RE | Admit: 2020-02-10 | Discharge: 2020-02-10 | Disposition: A | Discharge status: Home / Self Care | Payer: Medicaid Other | Source: Ambulatory Visit | Attending: Internal Medicine | Admitting: Internal Medicine

## 2020-02-10 ENCOUNTER — Encounter (HOSPITAL_COMMUNITY): Payer: Self-pay | Admitting: Internal Medicine

## 2020-02-10 VITALS — BP 116/78 | HR 115 | Ht 67.0 in | Wt 250.4 lb

## 2020-02-10 DIAGNOSIS — R0683 Snoring: Secondary | ICD-10-CM | POA: Diagnosis not present

## 2020-02-10 DIAGNOSIS — E0590 Thyrotoxicosis, unspecified without thyrotoxic crisis or storm: Secondary | ICD-10-CM | POA: Diagnosis not present

## 2020-02-10 DIAGNOSIS — E669 Obesity, unspecified: Secondary | ICD-10-CM | POA: Insufficient documentation

## 2020-02-10 DIAGNOSIS — Z87891 Personal history of nicotine dependence: Secondary | ICD-10-CM | POA: Insufficient documentation

## 2020-02-10 DIAGNOSIS — R55 Syncope and collapse: Secondary | ICD-10-CM | POA: Diagnosis not present

## 2020-02-10 DIAGNOSIS — I10 Essential (primary) hypertension: Secondary | ICD-10-CM

## 2020-02-10 DIAGNOSIS — I110 Hypertensive heart disease with heart failure: Secondary | ICD-10-CM | POA: Insufficient documentation

## 2020-02-10 DIAGNOSIS — Z6839 Body mass index (BMI) 39.0-39.9, adult: Secondary | ICD-10-CM | POA: Insufficient documentation

## 2020-02-10 DIAGNOSIS — I340 Nonrheumatic mitral (valve) insufficiency: Secondary | ICD-10-CM | POA: Insufficient documentation

## 2020-02-10 DIAGNOSIS — R9431 Abnormal electrocardiogram [ECG] [EKG]: Secondary | ICD-10-CM | POA: Insufficient documentation

## 2020-02-10 DIAGNOSIS — I5021 Acute systolic (congestive) heart failure: Secondary | ICD-10-CM | POA: Diagnosis not present

## 2020-02-10 DIAGNOSIS — I429 Cardiomyopathy, unspecified: Secondary | ICD-10-CM | POA: Insufficient documentation

## 2020-02-10 DIAGNOSIS — I5022 Chronic systolic (congestive) heart failure: Secondary | ICD-10-CM | POA: Insufficient documentation

## 2020-02-10 DIAGNOSIS — Z79899 Other long term (current) drug therapy: Secondary | ICD-10-CM | POA: Diagnosis not present

## 2020-02-10 DIAGNOSIS — Z8249 Family history of ischemic heart disease and other diseases of the circulatory system: Secondary | ICD-10-CM | POA: Insufficient documentation

## 2020-02-10 LAB — CBC
HCT: 45.8 % (ref 36.0–46.0)
Hemoglobin: 14.3 g/dL (ref 12.0–15.0)
MCH: 25.4 pg — ABNORMAL LOW (ref 26.0–34.0)
MCHC: 31.2 g/dL (ref 30.0–36.0)
MCV: 81.5 fL (ref 80.0–100.0)
Platelets: 199 10*3/uL (ref 150–400)
RBC: 5.62 MIL/uL — ABNORMAL HIGH (ref 3.87–5.11)
RDW: 12.3 % (ref 11.5–15.5)
WBC: 4.8 10*3/uL (ref 4.0–10.5)
nRBC: 0 % (ref 0.0–0.2)

## 2020-02-10 LAB — COMPREHENSIVE METABOLIC PANEL
ALT: 32 U/L (ref 0–44)
AST: 28 U/L (ref 15–41)
Albumin: 3.8 g/dL (ref 3.5–5.0)
Alkaline Phosphatase: 68 U/L (ref 38–126)
Anion gap: 11 (ref 5–15)
BUN: 9 mg/dL (ref 6–20)
CO2: 19 mmol/L — ABNORMAL LOW (ref 22–32)
Calcium: 9.2 mg/dL (ref 8.9–10.3)
Chloride: 107 mmol/L (ref 98–111)
Creatinine, Ser: 0.82 mg/dL (ref 0.44–1.00)
GFR calc Af Amer: >60 mL/min (ref >60)
GFR calc non Af Amer: >60 mL/min (ref >60)
Glucose, Bld: 105 mg/dL — ABNORMAL HIGH (ref 70–99)
Potassium: 3.8 mmol/L (ref 3.5–5.1)
Sodium: 137 mmol/L (ref 135–145)
Total Bilirubin: 1.2 mg/dL (ref 0.3–1.2)
Total Protein: 7.3 g/dL (ref 6.5–8.1)

## 2020-02-10 LAB — TSH: TSH: <0.01 u[IU]/mL — ABNORMAL LOW (ref 0.350–4.500)

## 2020-02-10 LAB — BRAIN NATRIURETIC PEPTIDE: B Natriuretic Peptide: 356.2 pg/mL — ABNORMAL HIGH (ref 0.0–100.0)

## 2020-02-10 LAB — T4, FREE: Free T4: 1.15 ng/dL — ABNORMAL HIGH (ref 0.61–1.12)

## 2020-02-10 MED ORDER — LISINOPRIL 40 MG PO TABS: 40.0000 mg | ORAL_TABLET | Freq: Every day | ORAL | 6 refills | Status: DC

## 2020-02-10 NOTE — Patient Instructions (Signed)
INCREASE Lisinopril 40mg  Daily   Your physician has requested that you have an echocardiogram. Echocardiography is a painless test that uses sound waves to create images of your heart. It provides your doctor with information about the size and shape of your heart and how well your heart's chambers and valves are working. This procedure takes approximately one hour. There are no restrictions for this procedure.  Call our office for your follow up appointment in 4-6 weeks with an echocardiogram   Labs done today, your results will be available in MyChart, we will contact you for abnormal readings.   If you have any questions or concerns before your next appointment please send a message through [facility] or call our office at [phone].    TO LEAVE A MESSAGE FOR THE NURSE SELECT OPTION 2, PLEASE LEAVE A MESSAGE INCLUDING: . YOUR NAME . DATE OF BIRTH . CALL BACK NUMBER . REASON FOR CALL**this is important as we prioritize the call backs  YOU WILL RECEIVE A CALL BACK THE SAME DAY AS LONG AS YOU CALL BEFORE 4:00 PM  At the [facility], you and your health needs are our priority. As part of our continuing mission to provide you with exceptional heart care, we have created designated Provider Care Teams. These Care Teams include your primary Cardiologist (physician) and Advanced Practice Providers (APPs- Physician Assistants and Nurse Practitioners) who all work together to provide you with the care you need, when you need it.   You may see any of the following providers on your designated Care Team at your next follow up: [phone] Dr Marland Kitchen . Dr Arvilla Meres . Marca Ancona, NP . Tonye Becket, PA . Robbie Lis, PharmD   Please be sure to bring in all your medications bottles to every appointment.

## 2020-02-10 NOTE — Progress Notes (Signed)
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 Hyperthyroidism..  She developed HF after the birth of her 4th child in 2010. She was in [place] at the time. Saw Dr. Antoine Poche EF 20%. Did not have a heart cath. She then moved back to [place] to be by her mother. She was followed at [place] by Dr. Fanny Bien. She had a high-risk pregnancy and had her 5th  Child in 97. Apparently EF dropped to 15-20% for a period. They suggested ICD but she refused. She returned to [place].  In July 2019 she was diagnosed with hyperthyroidism. Started on methimazole bu Dr Lucianne Muss.   Today she returns for HF follow up.\Previously she was having CP in setting of 30 pound weight gain. I suggested R/L cath but she refused saying she wanted to trya nd lose weight first and see how she felt. We eventually rescheduled but she couldn't get COVID test. Says she feels fine overall now. Doesn't go out of the house much. Gets SOB with mild activity. Had a bad episode of resting CP about 6 weeks ago when she ran out of meds for 4-5 days. No edema, orthopnea or PND. Says she is unable to make appointments due to lack of transportation.   Saw Dr. Lucianne Muss in 12/20. Was on methimazole. Has not seen him since and ran out of refills. He would not refill without seeing her (understandably) and she refuses to go back  Discussed possible I-131 radiation for Grave's previously but she never followed through.    Echo 2/21 LV dilated/spherical 30-35% likely severe MR. RV ok   Echo 12/2018 EF 35-40% Mod-Severe MR, RV normal Echo 11/16/2017 EF 20-25% Severe MR    ROS: All systems negative except as listed in HPI, PMH and Problem List.  SH:  Social History   Socioeconomic History  . Marital status: Single    Spouse name: Not on file  . Number of children:  Not on file  . Years of education: Not on file  . Highest education level: Not on file  Occupational History  . Not on file  Tobacco Use  . Smoking status: Former Games developer  . Smokeless tobacco: Never Used  Substance and Sexual Activity  . Alcohol use: Yes    Alcohol/week: 4.0 standard drinks    Types: 3 Glasses of wine, 1 Standard drinks or equivalent per week  . Drug use: Never  . Sexual activity: Not on file  Other Topics Concern  . Not on file  Social History Narrative   She has 4 children ages 42 and under.  She stays at home. She does not smoke cigarettes or drink alcohol. Does not get regular exercise.    Social Determinants of Health   Financial Resource Strain:   . Difficulty of Paying Living Expenses:   Food Insecurity:   . Worried About Programme researcher, broadcasting/film/video in the Last Year:   . Barista in the Last Year:   Transportation Needs:   . Freight forwarder (Medical):   Marland Kitchen Lack of Transportation (Non-Medical):   Physical Activity:   . Days of Exercise per Week:   . Minutes of Exercise per Session:   Stress:   . Feeling of Stress :   Social Connections:   . Frequency of Communication with Friends and Family:   .  Frequency of Social Gatherings with Friends and Family:   . Attends Religious Services:   . Active Member of Clubs or Organizations:   . Attends Banker Meetings:   Marland Kitchen Marital Status:   Intimate Partner Violence:   . Fear of Current or Ex-Partner:   . Emotionally Abused:   Marland Kitchen Physically Abused:   . Sexually Abused:     FH:  Family History  Problem Relation Age of Onset  . Heart disease Mother        No details  . Hypertension Mother   . Diabetes Mother   . Hypertension Father   . Heart murmur Sister     Past Medical History:  Diagnosis Date  . Cardiomyopathy   . HTN (hypertension)   . Mitral regurgitation     Current Outpatient Medications  Medication Sig Dispense Refill  . aspirin-acetaminophen-caffeine (EXCEDRIN  MIGRAINE) 250-250-65 MG tablet Take 2 tablets by mouth every 6 (six) hours as needed for headache.    . furosemide (LASIX) 40 MG tablet Take 40 mg by mouth as needed for fluid or edema.    . hydrOXYzine (ATARAX/VISTARIL) 25 MG tablet Take 25 mg by mouth 2 (two) times daily as needed for itching.    Marland Kitchen lisinopril (ZESTRIL) 20 MG tablet Take 1 tablet (20 mg total) by mouth daily. 30 tablet 5  . metoprolol succinate (TOPROL-XL) 50 MG 24 hr tablet Take 1.5 tablets (75 mg total) by mouth at bedtime. NEED OV. 45 tablet 2   No current facility-administered medications for this encounter.    Vitals:   02/10/20 1152  BP: 116/78  Pulse: (!) 115  SpO2: 98%  Weight: 113.6 kg (250 lb 6.4 oz)  Height: 5\' 7"  (1.702 m)   Wt Readings from Last 3 Encounters:  02/10/20 113.6 kg (250 lb 6.4 oz)  08/22/19 110.7 kg (244 lb)  06/26/19 108.6 kg (239 lb 6.4 oz)   PHYSICAL EXAM: General:  Obese woman. No resp difficulty HEENT: normal Neck: supple. no JVD. Carotids 2+ bilat; no bruits. No lymphadenopathy. ++ diffuse goiter Cor: PMI nondisplaced. Regular tachy  Lungs: clear Abdomen: obese soft, nontender, nondistended. No hepatosplenomegaly. No bruits or masses. Good bowel sounds. Extremities: no cyanosis, clubbing, rash, edema Neuro: alert & orientedx3, cranial nerves grossly intact. moves all 4 extremities w/o difficulty. Affect pleasant  ECG sinus 99 + LVH Personally reviewed   ASSESSMENT & PLAN: 1. Chronic systolic HF - Onset in 2010. 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 No cath or cMRI yet - Echo 5/19 EF 20-25% with severe posterior MR.   - ECHO 12/2018 EF 35-40% mod-severe MR with restricted posterior leaflet  - Echo 2/21 EF 30-35% mod-severe MR RV ok  - Remains NYHA II-III. Volume status ok  - Remains tachycardic and I am concerned about ongoing hyperthyroidism - Continue Toprol 50mg  twice a day.  - Intolerant spiro /entresto due to nausea/vomiting (failed 2x).   - Increase lisinopril to 40mg  daily -  Consider SGLT2i soon  - EF borderline for ICD. Will continue to push medical therapy. She has refused device implantation in past as well and would not qualify until thyroid disease fully addressed - Course complicated by lack of consistent f/u and intermittent non-complaiance with meds - labs today  2. Severe mitral regurgitation - felt to be functional MR but PMV leaflet is restricted as well.  - previously suggested TEE but she cnacelled  3. Heavy snoring/probable OSA - PSG 8/1 AHI 0.6  4. Hyperthyroidism - Previously on methimazole and followed  by Dr Lucianne Muss but now she is out and refuses to go back to Dr. Lucianne Muss.  - Previously discussed possible I-131 radiation for Grave's previously but she never followed through.   - I worry about persistent hyperthyroidism,. Recheck thyroid labs today   5. Syncope - Zio in 11/20 - Brief SVT/NSVT  6. HTN - BP ok   7.Obesity - continue weight loss efforts   Arvilla Meres MD 12:37 PM

## 2020-02-11 ENCOUNTER — Encounter (HOSPITAL_COMMUNITY): Payer: Self-pay

## 2020-02-11 LAB — T3, FREE: T3, Free: 4.3 pg/mL (ref 2.0–4.4)

## 2020-02-27 ENCOUNTER — Encounter: Payer: Self-pay | Admitting: Endocrinology

## 2020-03-03 ENCOUNTER — Telehealth: Payer: Self-pay | Admitting: Endocrinology

## 2020-03-03 NOTE — Telephone Encounter (Signed)
Patient dismissed from [facility] by Reather Littler, MD, effective 02/27/20. Dismissal Letter sent out by 1st class mail. [employer]

## 2020-04-01 NOTE — Progress Notes (Signed)
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 Hyperthyroidism..  She developed HF after the birth of her 4th child in 2010. She was in [place] at the time. Saw Dr. Antoine Poche EF 20%. Did not have a heart cath. She then moved back to [place] to be by her mother. She was followed at [place] by Dr. Fanny Bien. She had a high-risk pregnancy and had her 5th  Child in 42. Apparently EF dropped to 15-20% for a period. They suggested ICD but she refused. She returned to [place].  In July 2019 she was diagnosed with hyperthyroidism. Started on methimazole bu Dr Lucianne Muss.   Previously she was having CP in setting of 30 pound weight gain. I suggested R/L cath but she refused saying she wanted to trya nd lose weight first and see how she felt. We eventually rescheduled but she couldn't get COVID test.   I saw her in 8/21 and was quite tachycardic. She had not seen Dr. Lucianne Muss since 12/20. Ran out of methimazole refils. 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  TSH < 0.010. She has not followed up with Endo. On the chart there is a dismissal letter from Dr. Lucianne Muss dated 02/27/20.  She is here with her daughter. Overall feels ok. Had some mild CP but now resolved. SOB with walking distances. Able to do ADLs without problem. Palpitations about every other day for up to a minute. Has lost about 7 pounds. Always hot and sweating. Compliant with regular heart meds. Takes lasix as needed - 1-2x per month.   Echo today 04/02/20 EF 35-40% Personally reviewed   Echo 2/21 LV dilated/spherical 30-35% likely severe MR. RV ok   Echo 12/2018 EF 35-40% Mod-Severe MR, RV normal Echo 11/16/2017 EF 20-25% Severe MR     ROS: All systems negative except as listed in HPI, PMH and Problem List.  SH:  Social History   Socioeconomic History  . Marital status: Single    Spouse name: Not on file  . Number of children: Not on file  . Years of education: Not on file  . Highest education level: Not on file  Occupational History  . Not on file  Tobacco Use  . Smoking status: Former Games developer  . Smokeless tobacco: Never Used  Substance and Sexual Activity  . Alcohol use: Yes    Alcohol/week: 4.0 standard drinks    Types: 3 Glasses of wine, 1 Standard drinks or equivalent per week  . Drug use: Never  . Sexual activity: Not on file  Other Topics Concern  . Not on file  Social History Narrative   She has 4 children ages 33 and under.  She stays at home. She does not smoke cigarettes or drink alcohol. Does not get regular exercise.    Social Determinants of Health   Financial Resource Strain:   . Difficulty of Paying Living Expenses: Not on file  Food Insecurity:   . Worried About Programme researcher, broadcasting/film/video in the Last Year: Not on file  . Ran Out of Food in the Last Year: Not on file  Transportation Needs:   . Lack of Transportation (Medical): Not on file  . Lack of Transportation (Non-Medical): Not on  file  Physical Activity:   . Days of Exercise per Week: Not on file  . Minutes of Exercise per Session: Not on file  Stress:   . Feeling of Stress : Not on file  Social Connections:   . Frequency of Communication with Friends and Family: Not on file  . Frequency of Social Gatherings with Friends and Family: Not on file  . Attends Religious Services: Not on file  . Active Member of Clubs or Organizations: Not on file  . Attends Banker Meetings: Not on file  . Marital Status: Not on file  Intimate Partner Violence:   . Fear of Current or Ex-Partner: Not on file  . Emotionally Abused: Not on file  . Physically Abused: Not on file  . Sexually Abused: Not on file    FH:  Family History   Problem Relation Age of Onset  . Heart disease Mother        No details  . Hypertension Mother   . Diabetes Mother   . Hypertension Father   . Heart murmur Sister     Past Medical History:  Diagnosis Date  . Cardiomyopathy   . HTN (hypertension)   . Mitral regurgitation     Current Outpatient Medications  Medication Sig Dispense Refill  . aspirin-acetaminophen-caffeine (EXCEDRIN MIGRAINE) 250-250-65 MG tablet Take 2 tablets by mouth every 6 (six) hours as needed for headache.    . furosemide (LASIX) 40 MG tablet Take 40 mg by mouth as needed for fluid or edema.    . hydrOXYzine (ATARAX/VISTARIL) 25 MG tablet Take 25 mg by mouth 2 (two) times daily as needed for itching.    Marland Kitchen lisinopril (ZESTRIL) 40 MG tablet Take 1 tablet (40 mg total) by mouth daily. 30 tablet 6  . metoprolol succinate (TOPROL-XL) 50 MG 24 hr tablet Take 1.5 tablets (75 mg total) by mouth at bedtime. NEED OV. 45 tablet 2   No current facility-administered medications for this encounter.    Vitals:   04/02/20 0945  BP: 120/78  Pulse: (!) 110  SpO2: 99%  Weight: 110.4 kg (243 lb 6.4 oz)  Height: 5\' 8"  (1.727 m)   Wt Readings from Last 3 Encounters:  02/10/20 113.6 kg (250 lb 6.4 oz)  08/22/19 110.7 kg (244 lb)  06/26/19 108.6 kg (239 lb 6.4 oz)   PHYSICAL EXAM: General:  Obese woman No resp difficulty HEENT: normal Neck: supple. no JVD. Carotids 2+ bilat; no bruits. No+ prominent goiter Cor: PMI nondisplaced. Regular  Tachy  No rubs, gallops or murmurs. Lungs: clear Abdomen: obese soft, nontender, nondistended. No hepatosplenomegaly. No bruits or masses. Good bowel sounds. Extremities: no cyanosis, clubbing, rash, edema Neuro: alert & orientedx3, cranial nerves grossly intact. moves all 4 extremities w/o difficulty. Affect pleasant  ECG Sinus tach 108 + LVH Personally reviewed   ASSESSMENT & PLAN: 1. Chronic systolic HF - Onset in 2010. 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 No cath or cMRI yet - Echo 5/19 EF  20-25% with severe posterior MR.   - ECHO 12/2018 EF 35-40% mod-severe MR with restricted posterior leaflet  - Echo 2/21 EF 30-35% mod-severe MR RV ok  - Echo today 04/02/20 EF 35-40% moderate to severe MR Personally reviewed - Remains NYHA II-III. Volume status ok  - Remains tachycardic in the setting of ongoing hyperthyroidism - Continue Toprol 50mg  twice a day.  - Intolerant spiro /entresto due to nausea/vomiting (failed 2x).   - Continue lisinopril 40mg  daily - Consider SGLT2i soon  - EF  borderline for ICD. (35-40% today up from 30-35%) Will continue to push medical therapy. She has refused device implantation in past as well and would not qualify until thyroid disease fully addressed - Course complicated by lack of consistent f/u and intermittent non-complaiance with meds  2. Severe mitral regurgitation - felt to be functional MR but PMV leaflet is restricted as well.  - previously suggested TEE but she cancelled - MR is moderate to severe on echo today  3. Heavy snoring/probable OSA - PSG 8/1 AHI 0.6  4. Hyperthyroidism - Previously on methimazole and followed by Dr Lucianne Muss - Previously discussed possible I-131 radiation for Grave's previously but she never followed through.   - Labs c/w ongoing hyperthryoidism.  - Since she has been dismissed from [facility] I have refilled her methimazole at previous dose (10 bid) and strongly stressed the need to get another Endocrinologist in town ASAP  5. Syncope - Zio in 11/20 - Brief SVT/NSVT  6. HTN - BP ok   7.Obesity - continue weight loss efforts  Total time spent 45 minutes. Over half that time spent discussing above.     Arvilla Meres MD 9:54 PM

## 2020-04-02 ENCOUNTER — Other Ambulatory Visit: Payer: Self-pay

## 2020-04-02 ENCOUNTER — Ambulatory Visit (HOSPITAL_BASED_OUTPATIENT_CLINIC_OR_DEPARTMENT_OTHER)
Admission: RE | Admit: 2020-04-02 | Discharge: 2020-04-02 | Disposition: A | Discharge status: Home / Self Care | Payer: Medicaid Other | Source: Ambulatory Visit | Attending: Internal Medicine | Admitting: Internal Medicine

## 2020-04-02 ENCOUNTER — Ambulatory Visit (HOSPITAL_COMMUNITY)
Admission: RE | Admit: 2020-04-02 | Discharge: 2020-04-02 | Disposition: A | Discharge status: Home / Self Care | Payer: Medicaid Other | Source: Ambulatory Visit | Attending: Internal Medicine | Admitting: Internal Medicine

## 2020-04-02 ENCOUNTER — Encounter (HOSPITAL_COMMUNITY): Payer: Self-pay | Admitting: Internal Medicine

## 2020-04-02 VITALS — BP 120/78 | HR 110 | Ht 68.0 in | Wt 243.4 lb

## 2020-04-02 DIAGNOSIS — I5022 Chronic systolic (congestive) heart failure: Secondary | ICD-10-CM | POA: Insufficient documentation

## 2020-04-02 DIAGNOSIS — E669 Obesity, unspecified: Secondary | ICD-10-CM | POA: Diagnosis not present

## 2020-04-02 DIAGNOSIS — Z87891 Personal history of nicotine dependence: Secondary | ICD-10-CM | POA: Insufficient documentation

## 2020-04-02 DIAGNOSIS — I429 Cardiomyopathy, unspecified: Secondary | ICD-10-CM | POA: Diagnosis not present

## 2020-04-02 DIAGNOSIS — I5021 Acute systolic (congestive) heart failure: Secondary | ICD-10-CM

## 2020-04-02 DIAGNOSIS — I471 Supraventricular tachycardia: Secondary | ICD-10-CM | POA: Insufficient documentation

## 2020-04-02 DIAGNOSIS — E0590 Thyrotoxicosis, unspecified without thyrotoxic crisis or storm: Secondary | ICD-10-CM

## 2020-04-02 DIAGNOSIS — I340 Nonrheumatic mitral (valve) insufficiency: Secondary | ICD-10-CM

## 2020-04-02 DIAGNOSIS — R071 Chest pain on breathing: Secondary | ICD-10-CM

## 2020-04-02 DIAGNOSIS — Z8249 Family history of ischemic heart disease and other diseases of the circulatory system: Secondary | ICD-10-CM | POA: Insufficient documentation

## 2020-04-02 DIAGNOSIS — I110 Hypertensive heart disease with heart failure: Secondary | ICD-10-CM | POA: Insufficient documentation

## 2020-04-02 DIAGNOSIS — R0683 Snoring: Secondary | ICD-10-CM | POA: Diagnosis not present

## 2020-04-02 DIAGNOSIS — Z79899 Other long term (current) drug therapy: Secondary | ICD-10-CM | POA: Insufficient documentation

## 2020-04-02 DIAGNOSIS — I472 Ventricular tachycardia: Secondary | ICD-10-CM | POA: Insufficient documentation

## 2020-04-02 LAB — ECHOCARDIOGRAM COMPLETE
MV M vel: 5.87 m/s
MV Peak grad: 137.8 mmHg
Radius: 0.8 cm
S' Lateral: 2.9 cm

## 2020-04-02 MED ORDER — METHIMAZOLE 10 MG PO TABS: 10.0000 mg | ORAL_TABLET | Freq: Two times a day (BID) | ORAL | 1 refills | Status: DC

## 2020-04-02 NOTE — Patient Instructions (Signed)
START Tapazole 10mg  Twice daily  Your physician recommends that you find a Thyroid Doctor as soon as possible.  Your physician recommends that you return for a follow up appointment in 2 months  If you have any questions or concerns before your next appointment please send a message through [place] or call our office at [phone].    TO LEAVE A MESSAGE FOR THE NURSE SELECT OPTION 2, PLEASE LEAVE A MESSAGE INCLUDING: . YOUR NAME . DATE OF BIRTH . CALL BACK NUMBER . REASON FOR CALL**this is important as we prioritize the call backs  YOU WILL RECEIVE A CALL BACK THE SAME DAY AS LONG AS YOU CALL BEFORE 4:00 PM

## 2020-04-28 ENCOUNTER — Other Ambulatory Visit: Payer: Self-pay | Admitting: Family Medicine

## 2020-04-28 ENCOUNTER — Other Ambulatory Visit (HOSPITAL_COMMUNITY): Payer: Self-pay | Admitting: Internal Medicine

## 2020-04-28 MED ORDER — METOPROLOL SUCCINATE ER 50 MG PO TB24: 50.0000 mg | ORAL_TABLET | Freq: Two times a day (BID) | ORAL | 2 refills | Status: DC

## 2020-04-28 NOTE — Telephone Encounter (Signed)
Requested medication (s) are due for refill today - expired  Requested medication (s) are on the active medication list -yes  Future visit scheduled -no  Last refill: 02/11/19  Notes to clinic: Request RF - hisptrical provider, expired Rx  Requested Prescriptions  Pending Prescriptions Disp Refills   hydrOXYzine (ATARAX/VISTARIL) 25 MG tablet [Pharmacy Med Name: hydrOXYzine HCl 25 MG Oral Tablet] 60 tablet 0    Sig: TAKE 1 TABLET BY MOUTH TWICE DAILY AS NEEDED FOR ITCHING      Ear, Nose, and Throat:  Antihistamines Failed - 04/28/2020  2:48 PM      Failed - Valid encounter within last 12 months    Recent Outpatient Visits           1 year ago Hyperthyroidism   [facility], Odette Horns, MD                  Requested Prescriptions  Pending Prescriptions Disp Refills   hydrOXYzine (ATARAX/VISTARIL) 25 MG tablet [Pharmacy Med Name: hydrOXYzine HCl 25 MG Oral Tablet] 60 tablet 0    Sig: TAKE 1 TABLET BY MOUTH TWICE DAILY AS NEEDED FOR ITCHING      Ear, Nose, and Throat:  Antihistamines Failed - 04/28/2020  2:48 PM      Failed - Valid encounter within last 12 months    Recent Outpatient Visits           1 year ago Hyperthyroidism   [facility] [name], MD

## 2020-06-04 ENCOUNTER — Other Ambulatory Visit: Payer: Self-pay

## 2020-06-04 ENCOUNTER — Encounter (HOSPITAL_COMMUNITY): Payer: Self-pay | Admitting: Internal Medicine

## 2020-06-04 ENCOUNTER — Ambulatory Visit (HOSPITAL_COMMUNITY)
Admission: RE | Admit: 2020-06-04 | Discharge: 2020-06-04 | Disposition: A | Discharge status: Home / Self Care | Payer: Medicaid Other | Source: Ambulatory Visit | Attending: Internal Medicine | Admitting: Internal Medicine

## 2020-06-04 VITALS — BP 122/88 | HR 84 | Wt 251.2 lb

## 2020-06-04 DIAGNOSIS — I10 Essential (primary) hypertension: Secondary | ICD-10-CM | POA: Diagnosis not present

## 2020-06-04 DIAGNOSIS — I471 Supraventricular tachycardia: Secondary | ICD-10-CM | POA: Diagnosis not present

## 2020-06-04 DIAGNOSIS — Z87891 Personal history of nicotine dependence: Secondary | ICD-10-CM | POA: Insufficient documentation

## 2020-06-04 DIAGNOSIS — Z7982 Long term (current) use of aspirin: Secondary | ICD-10-CM | POA: Insufficient documentation

## 2020-06-04 DIAGNOSIS — Z7983 Long term (current) use of bisphosphonates: Secondary | ICD-10-CM | POA: Diagnosis not present

## 2020-06-04 DIAGNOSIS — R062 Wheezing: Secondary | ICD-10-CM | POA: Diagnosis not present

## 2020-06-04 DIAGNOSIS — R0683 Snoring: Secondary | ICD-10-CM | POA: Diagnosis not present

## 2020-06-04 DIAGNOSIS — I5022 Chronic systolic (congestive) heart failure: Secondary | ICD-10-CM | POA: Diagnosis not present

## 2020-06-04 DIAGNOSIS — I110 Hypertensive heart disease with heart failure: Secondary | ICD-10-CM | POA: Insufficient documentation

## 2020-06-04 DIAGNOSIS — I340 Nonrheumatic mitral (valve) insufficiency: Secondary | ICD-10-CM

## 2020-06-04 DIAGNOSIS — I428 Other cardiomyopathies: Secondary | ICD-10-CM | POA: Diagnosis not present

## 2020-06-04 DIAGNOSIS — Z8249 Family history of ischemic heart disease and other diseases of the circulatory system: Secondary | ICD-10-CM | POA: Insufficient documentation

## 2020-06-04 DIAGNOSIS — R079 Chest pain, unspecified: Secondary | ICD-10-CM | POA: Insufficient documentation

## 2020-06-04 DIAGNOSIS — I472 Ventricular tachycardia: Secondary | ICD-10-CM | POA: Diagnosis not present

## 2020-06-04 DIAGNOSIS — Z79899 Other long term (current) drug therapy: Secondary | ICD-10-CM | POA: Diagnosis not present

## 2020-06-04 DIAGNOSIS — E669 Obesity, unspecified: Secondary | ICD-10-CM | POA: Diagnosis not present

## 2020-06-04 DIAGNOSIS — R112 Nausea with vomiting, unspecified: Secondary | ICD-10-CM | POA: Insufficient documentation

## 2020-06-04 DIAGNOSIS — E0590 Thyrotoxicosis, unspecified without thyrotoxic crisis or storm: Secondary | ICD-10-CM | POA: Insufficient documentation

## 2020-06-04 HISTORY — DX: Heart failure, unspecified: I50.9

## 2020-06-04 LAB — COMPREHENSIVE METABOLIC PANEL
ALT: 34 U/L (ref 0–44)
AST: 27 U/L (ref 15–41)
Albumin: 3.6 g/dL (ref 3.5–5.0)
Alkaline Phosphatase: 74 U/L (ref 38–126)
Anion gap: 10 (ref 5–15)
BUN: 9 mg/dL (ref 6–20)
CO2: 19 mmol/L — ABNORMAL LOW (ref 22–32)
Calcium: 8.7 mg/dL — ABNORMAL LOW (ref 8.9–10.3)
Chloride: 106 mmol/L (ref 98–111)
Creatinine, Ser: 0.71 mg/dL (ref 0.44–1.00)
GFR, Estimated: >60 mL/min (ref >60)
Glucose, Bld: 142 mg/dL — ABNORMAL HIGH (ref 70–99)
Potassium: 3.5 mmol/L (ref 3.5–5.1)
Sodium: 135 mmol/L (ref 135–145)
Total Bilirubin: 0.9 mg/dL (ref 0.3–1.2)
Total Protein: 6.9 g/dL (ref 6.5–8.1)

## 2020-06-04 LAB — TSH: TSH: 0.659 u[IU]/mL (ref 0.350–4.500)

## 2020-06-04 LAB — CBC
HCT: 45.6 % (ref 36.0–46.0)
Hemoglobin: 14.2 g/dL (ref 12.0–15.0)
MCH: 25.5 pg — ABNORMAL LOW (ref 26.0–34.0)
MCHC: 31.1 g/dL (ref 30.0–36.0)
MCV: 81.9 fL (ref 80.0–100.0)
Platelets: 205 10*3/uL (ref 150–400)
RBC: 5.57 MIL/uL — ABNORMAL HIGH (ref 3.87–5.11)
RDW: 14.8 % (ref 11.5–15.5)
WBC: 4.8 10*3/uL (ref 4.0–10.5)
nRBC: 0 % (ref 0.0–0.2)

## 2020-06-04 LAB — T4, FREE: Free T4: 0.94 ng/dL (ref 0.61–1.12)

## 2020-06-04 LAB — BRAIN NATRIURETIC PEPTIDE: B Natriuretic Peptide: 257.4 pg/mL — ABNORMAL HIGH (ref 0.0–100.0)

## 2020-06-04 MED ORDER — LISINOPRIL 20 MG PO TABS: 20.0000 mg | ORAL_TABLET | Freq: Two times a day (BID) | ORAL | 6 refills | Status: DC

## 2020-06-04 NOTE — Progress Notes (Signed)
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 Hyperthyroidism..  She developed HF after the birth of her 4th child in 2010. [place] was in [place] the time. Saw Dr. Antoine Poche EF 20%. Did not have a heart cath. She then moved back to [place] to be by her mother. She was followed at [place] by Dr. Fanny Bien. She had a high-risk pregnancy and had her 5th  Child in 50. Apparently EF dropped to 15-20% for a period. They suggested ICD but she refused. She returned to [place].  In July 2019 she was diagnosed with hyperthyroidism. Started on methimazole bu Dr Lucianne Muss.   Previously she was having CP in setting of 30 pound weight gain. I suggested R/L cath but she refused saying she wanted to trya nd lose weight first and see how she felt. We eventually rescheduled but she couldn't get COVID test.   I saw her in 8/21 and was quite tachycardic. She had not seen Dr. Lucianne Muss since 12/20. Ran out of methimazole refils. 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  TSH < 0.010. She has not followed up with Endo. On the chart there is a dismissal letter from Dr. Lucianne Muss dated 02/27/20.  Here for f/u. Having sharp pains in her chest when she moves. Had some wheezing in the other day and took some lasix. No edema, orthopnea or PND. Still has not seen Endo. (trying to find a new clinic)    Echo 04/02/20 EF 35-40% Personally reviewed   Echo 2/21 LV dilated/spherical 30-35% likely severe MR. RV ok  Echo 12/2018 EF 35-40% Mod-Severe MR, RV normal Echo 11/16/2017 EF 20-25% Severe MR    ROS: All systems negative except as listed in HPI, PMH and Problem List.  SH:  Social History   Socioeconomic History  .  Marital status: Single    Spouse name: Not on file  . Number of children: Not on file  . Years of education: Not on file  . Highest education level: Not on file  Occupational History  . Not on file  Tobacco Use  . Smoking status: Former Games developer  . Smokeless tobacco: Never Used  Substance and Sexual Activity  . Alcohol use: Yes    Alcohol/week: 4.0 standard drinks    Types: 3 Glasses of wine, 1 Standard drinks or equivalent per week  . Drug use: Never  . Sexual activity: Not on file  Other Topics Concern  . Not on file  Social History Narrative   She has 4 children ages 34 and under.  She stays at home. She does not smoke cigarettes or drink alcohol. Does not get regular exercise.    Social Determinants of Health   Financial Resource Strain: Not on file  Food Insecurity: Not on file  Transportation Needs: Not on file  Physical Activity: Not on file  Stress: Not on file  Social Connections: Not on file  Intimate Partner Violence: Not on file    FH:  Family History  Problem Relation Age of Onset  . Heart disease Mother        No details  . Hypertension Mother   . Diabetes Mother   . Hypertension Father   . Heart  murmur Sister     Past Medical History:  Diagnosis Date  . Cardiomyopathy   . CHF (congestive heart failure) (HCC)   . HTN (hypertension)   . Mitral regurgitation     Current Outpatient Medications  Medication Sig Dispense Refill  . aspirin-acetaminophen-caffeine (EXCEDRIN MIGRAINE) 250-250-65 MG tablet Take 2 tablets by mouth every 6 (six) hours as needed for headache.    . furosemide (LASIX) 40 MG tablet Take 40 mg by mouth as needed for fluid or edema.    . hydrOXYzine (ATARAX/VISTARIL) 25 MG tablet Take 25 mg by mouth 2 (two) times daily as needed for itching.    . methimazole (TAPAZOLE) 10 MG tablet Take 1 tablet (10 mg total) by mouth 2 (two) times daily. 60 tablet 1  . metoprolol succinate (TOPROL-XL) 50 MG 24 hr tablet Take 1 tablet (50 mg total)  by mouth in the morning and at bedtime. 60 tablet 2  . lisinopril (ZESTRIL) 20 MG tablet Take 1 tablet by mouth once daily 30 tablet 3   No current facility-administered medications for this encounter.    Vitals:   06/04/20 1042  BP: 122/88  Pulse: 84  SpO2: 98%  Weight: 113.9 kg (251 lb 3.2 oz)   Wt Readings from Last 3 Encounters:  06/04/20 113.9 kg (251 lb 3.2 oz)  04/02/20 110.4 kg (243 lb 6.4 oz)  02/10/20 113.6 kg (250 lb 6.4 oz)   PHYSICAL EXAM: General:  Obese. No resp difficulty HEENT: normal Neck: supple. JVP 7-8. Carotids 2+ bilat; no bruits. No lymphadenopathy  + thryomegaly Cor: PMI nondisplaced. Regular rate & rhythm. 2/6 TR Lungs: clear Abdomen: obese soft, nontender, nondistended. No hepatosplenomegaly. No bruits or masses. Good bowel sounds. Extremities: no cyanosis, clubbing, rash, edema Neuro: alert & orientedx3, cranial nerves grossly intact. moves all 4 extremities w/o difficulty. Affect pleasant   ASSESSMENT & PLAN: 1. Chronic systolic HF - Onset in 2010. 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 No cath or cMRI yet - Echo 5/19 EF 20-25% with severe posterior MR.   - ECHO 12/2018 EF 35-40% mod-severe MR with restricted posterior leaflet  - Echo 2/21 EF 30-35% mod-severe MR RV ok  - Echo 04/02/20 EF 35-40% moderate to severe MR Personally reviewed - Stable NYHA II-early III. Overall seems a bit better.   - Mildly volume overloaded. Take lasix as needed - Continue Toprol 50mg  twice a day.  - Intolerant spiro /entresto due to nausea/vomiting (failed 2x).   - On lisinopril 20mg  daily (we had previously said 40 daily) Will increase to 20 bid - We discussed starting SGLT2i but she states that she had multiple episodes of pyelo in past and would prefer to avoid - EF borderline for ICD. (35-40% today up from 30-35%) Will continue to push medical therapy. She has refused device implantation in past as well and would not qualify until thyroid disease fully addressed - Course  complicated by lack of consistent f/u and intermittent non-complaiance with meds.  - Labs today  2. Severe mitral regurgitation - felt to be functional MR but PMV leaflet is restricted as well.  - previously suggested TEE but she cancelled - MR is moderate to severe on echo 10/21 - continue to follow  3. Heavy snoring/probable OSA - PSG 8/1 AHI 0.6  4. Hyperthyroidism - Previously on methimazole and followed by Dr - Previously discussed possible I-131 radiation for Grave's previously but she never followed through.   - Labs c/w ongoing hyperthryoidism. Will recheck today - Since she has been  dismissed from [facility] I have refilled her methimazole at previous dose (10 bid) and strongly stressed the need to get another Endocrinologist in town ASAP. She is working on this and says she has to go through [facility] and she sees them in 2 weeks  5. Syncope - Zio in 11/20 - Brief SVT/NSVT  6. HTN - Improved  7.Obesity - continue weight loss efforts    Arvilla Meres MD 11:03 AM

## 2020-06-04 NOTE — Patient Instructions (Signed)
Increase Lisinopril to 20 mg Twice daily   Labs done today, your results will be available in MyChart, we will contact you for abnormal readings.  Please call our office in March 2022 to schedule your follow up appointment  If you have any questions or concerns before your next appointment please send [place] a message through [place] or call our office at [phone].    TO LEAVE A MESSAGE FOR THE NURSE SELECT OPTION 2, PLEASE LEAVE A MESSAGE INCLUDING: . YOUR NAME . DATE OF BIRTH . CALL BACK NUMBER . REASON FOR CALL**this is important as we prioritize the call backs  YOU WILL RECEIVE A CALL BACK THE SAME DAY AS LONG AS YOU CALL BEFORE 4:00 PM  At the [facility], you and your health needs are our priority. As part of our continuing mission to provide you with exceptional heart care, we have created designated Provider Care Teams. These Care Teams include your primary Cardiologist (physician) and Advanced Practice Providers (APPs- Physician Assistants and Nurse Practitioners) who all work together to provide you with the care you need, when you need it.   You may see any of the following providers on your designated Care Team at your next follow up: Marland Kitchen Dr Arvilla Meres . Dr Marca Ancona . Tonye Becket, NP . Robbie Lis, PA . Karle Plumber, PharmD   Please be sure to bring in all your medications bottles to every appointment.

## 2020-06-04 NOTE — Addendum Note (Signed)
Encounter addended by: Noralee Space, RN on: 06/04/2020 11:18 AM  Actions taken: Diagnosis association updated, Order list changed, Clinical Note Signed, Charge Capture section accepted

## 2020-06-05 LAB — T3, FREE: T3, Free: 3.3 pg/mL (ref 2.0–4.4)

## 2020-10-10 ENCOUNTER — Other Ambulatory Visit (HOSPITAL_COMMUNITY): Payer: Self-pay | Admitting: Internal Medicine

## 2020-10-10 ENCOUNTER — Other Ambulatory Visit: Payer: Self-pay | Admitting: Cardiology

## 2020-10-12 ENCOUNTER — Other Ambulatory Visit (HOSPITAL_COMMUNITY): Payer: Self-pay

## 2020-10-12 MED ORDER — LISINOPRIL 20 MG PO TABS: 20.0000 mg | ORAL_TABLET | Freq: Two times a day (BID) | ORAL | 1 refills | Status: DC

## 2020-10-12 NOTE — Telephone Encounter (Signed)
Rx has been sent to the pharmacy electronically. ° °

## 2020-11-12 ENCOUNTER — Other Ambulatory Visit (HOSPITAL_COMMUNITY): Payer: Self-pay | Admitting: *Deleted

## 2020-11-12 DIAGNOSIS — I5022 Chronic systolic (congestive) heart failure: Secondary | ICD-10-CM

## 2021-02-16 ENCOUNTER — Ambulatory Visit (HOSPITAL_COMMUNITY): Admission: RE | Admit: 2021-02-16 | Payer: Medicaid Other | Source: Ambulatory Visit

## 2021-02-16 ENCOUNTER — Encounter (HOSPITAL_COMMUNITY): Payer: Medicaid Other | Admitting: Internal Medicine

## 2021-03-08 NOTE — Progress Notes (Signed)
ADVANCED HF CLINIC NOTE  Primary Cardiologist: Dr Antoine Poche  HF MD: Dr Gala Romney  Endocrinology: Dr Lucianne Muss  HPI: Sarah Reilly is a 40 y.o. 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    She developed HF after the birth of her 4th child in 2010. She was in [place] at the time. Saw Dr. Antoine Poche EF 20%. Did not have a heart cath.  She then moved back to [place] to be by her mother.  She was followed at [place] by Dr. Fanny Bien.  She had a high-risk pregnancy and had her 5th Child in 58. Apparently EF dropped to 15-20% for a period. They suggested ICD but she refused. She returned to [place].  In July 2019 she was diagnosed with hyperthyroidism. Started on methimazole bu Dr Lucianne Muss.   Previously she was having CP in setting of 30 pound weight gain. Dr. Gala Romney suggested R/L cath but she refused saying she wanted to try and lose weight first and see how she felt. We eventually rescheduled but she couldn't get COVID test.   She was seen in [facility] 8/21 and was quite tachycardic. She had not seen Dr. Lucianne Muss since 12/20. Ran out of methimazole refils. 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  TSH < 0.010. She has not followed up with Endo. On the chart there is a dismissal letter from Dr. Lucianne Muss dated 02/27/20.  Had f/u echo 04/02/20 EF 35-40%, RV normal. Mod-severe MR w/ tethering of posterior leaflet.   Last seen in [facility] 12/12. Reported stable NYHA II-early III symptoms. Volume status mildly elevated. Was advised to continue w/ PRN lasix and to increase lisinopril to 20 mg bid. Also encouraged to establish care w/ new endocrinologist. She had return f/u in [facility] 8/22 but canceled the appointment.  She now presents back for f/u. Echo done today and personally reviewed by Dr. Gala Romney. LVEF is 30-35%. RV mildly  reduced w/ mildly elevated PASP, BAE (LA severe, RA mod), severe central/posterior MR likely secondary to annular dilation (secondary) and tethering of the posterior leaflet. Mild-Mod TR.   She reports that she has been doing "ok" since last visit. She has felt mild increase in exertional SOB over the last view days and started back taking her lasix yesterday (takes only PNR). She has noticed improvement today, less dyspnea w/ activity. She says she can tell when she is retaining fluid and usually feels better and back to baseline once she takes diuretic. She denies LEE, orthopnea/PND. She also reports recent productive cough, yellow sputum, which she attributes to bronchitis flare. Denies fever and chills. Her BP is mildly elevated today but she has not yet taken her morning meds. She also has occasional left sided arm and jaw pain and occasional CP that has been chronic for her. Currently pain free. EKG show NSR w/ inferolateral TWIs + LVH.   We spent a long time discussing her chronic heart failure, the severity of her MR, chest/ left arm pain and importance of further testing/ intervention (TEE, R/LHC, potential MitraClip). She remains resistant and continues to decline further w/u at this time, even though I explained to her that today's echo is now showing evidence of rt sided heart strain. She was provided patient information regarding Mitraclip and will consider and discuss at her next f/u w/ Dr. Gala Romney.   She has  also failed to f/u w/ a new endocrinologist.    Echo 03/09/21 EF 30-35%, RV mildly reduced, severe MR, mild-mod TR  Echo 04/02/20 EF 35-40% Personally reviewed Echo 2/21 LV dilated/spherical 30-35% likely severe MR. RV ok  Echo 12/2018 EF 35-40% Mod-Severe MR, RV normal Echo 11/16/2017 EF 20-25% Severe MR    ROS: All systems negative except as listed in HPI, PMH and Problem List.  SH:  Social History   Socioeconomic History   Marital status: Single    Spouse name: Not on file    Number of children: Not on file   Years of education: Not on file   Highest education level: Not on file  Occupational History   Not on file  Tobacco Use   Smoking status: Former   Smokeless tobacco: Never  Substance and Sexual Activity   Alcohol use: Yes    Alcohol/week: 4.0 standard drinks    Types: 3 Glasses of wine, 1 Standard drinks or equivalent per week   Drug use: Never   Sexual activity: Not on file  Other Topics Concern   Not on file  Social History Narrative   She has 4 children ages 55 and under.  She stays at home. She does not smoke cigarettes or drink alcohol. Does not get regular exercise.    Social Determinants of Corporate investment banker Strain: Not on file  Food Insecurity: Not on file  Transportation Needs: Not on file  Physical Activity: Not on file  Stress: Not on file  Social Connections: Not on file  Intimate Partner Violence: Not on file    FH:  Family History  Problem Relation Age of Onset   Heart disease Mother        No details   Hypertension Mother    Diabetes Mother    Hypertension Father    Heart murmur Sister     Past Medical History:  Diagnosis Date   Cardiomyopathy    CHF (congestive heart failure) (HCC)    HTN (hypertension)    Mitral regurgitation     Current Outpatient Medications  Medication Sig Dispense Refill   aspirin-acetaminophen-caffeine (EXCEDRIN MIGRAINE) 250-250-65 MG tablet Take 2 tablets by mouth every 6 (six) hours as needed for headache.     furosemide (LASIX) 40 MG tablet Take 1 tablet by mouth once daily (Patient taking differently: As needed) 30 tablet 2   lisinopril (ZESTRIL) 20 MG tablet Take 1 tablet (20 mg total) by mouth in the morning and at bedtime. 60 tablet 1   metoprolol succinate (TOPROL-XL) 50 MG 24 hr tablet Take 1 tablet (50 mg total) by mouth 2 (two) times daily. Needs appt 180 tablet 0   No current facility-administered medications for this encounter.    Vitals:   03/09/21 0908   BP: (!) 140/108  Pulse: 86  SpO2: 99%  Weight: 114.9 kg (253 lb 6.4 oz)    Wt Readings from Last 3 Encounters:  03/09/21 114.9 kg (253 lb 6.4 oz)  06/04/20 113.9 kg (251 lb 3.2 oz)  04/02/20 110.4 kg (243 lb 6.4 oz)   PHYSICAL EXAM: General:  moderately obese AAF. No respiratory difficulty HEENT: normal + thyromegaly   Neck: supple. JVD 9 cm. Carotids 2+ bilat; no bruits. No lymphadenopathy or thyromegaly appreciated. Cor: PMI nondisplaced. Regular rate & rhythm. 3/6 MR murmur  Lungs: clear Abdomen: obese, soft, nontender, nondistended. No hepatosplenomegaly. No bruits or masses. Good bowel sounds. Extremities: no cyanosis, clubbing, rash, edema Neuro: alert & oriented x  3, cranial nerves grossly intact. moves all 4 extremities w/o difficulty. Affect pleasant.    ASSESSMENT & PLAN: 1. Chronic Systolic HF - Onset in 2010. 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 No cath or cMRI yet - Echo 5/19 EF 20-25% with severe posterior MR.   - ECHO 12/2018 EF 35-40% mod-severe MR with restricted posterior leaflet  - Echo 2/21 EF 30-35% mod-severe MR RV ok  - Echo 04/02/20 EF 35-40% moderate to severe MR  - Echo today: LVEF 30-35%, RV mildly reduced, Severe MR, Mild TR  - NYHA II-early III. Mildly fluid overloaded on exam  - Takes lasix as needed. Advised to take Lasix 40 daily x 3 days then return to PRN  - Continue Toprol 50mg  twice a day.  - Intolerant spiro /entresto due to nausea/vomiting (failed 2x).   - Continue lisinopril 20mg  mg bid  - We discussed starting SGLT2i but she states that she had multiple episodes of pyelo in past and would prefer to avoid - She has declined ICD  - Course complicated by lack of consistent f/u and intermittent non-complaiance with meds.  - Check BMP today   2. Severe mitral regurgitation - felt to be functional MR but PMV leaflet is restricted as well - MR severe today's echo  - previously suggested TEE but she cancelled. We discussed again today and she continues to  decline - she was provided pamphlet on MitraClip to take home for consideration and will d/w MD at next f/u  - continue to follow   3. Heavy snoring/probable OSA - PSG 8/1 AHI 0.6   4. Hyperthyroidism - Previously on methimazole and followed by Dr - Previously discussed possible I-131 radiation for Grave's previously but she never followed through.   - Check labs today  - She has been dismissed from [facility]. Strongly stressed the need to get another Endocrinologist for further management   5. Syncope - Zio in 11/20 - Brief SVT/NSVT - no recurrence   6. HTN - mildly elevated today but hasn't taken meds yet - no change. Check BMP   7.Obesity - continue weight loss efforts  F/u w/ Dr. [facility] in 2 months   Corwyn Vora 12/20 PA-C  3:58 PM

## 2021-03-09 ENCOUNTER — Encounter (HOSPITAL_COMMUNITY): Payer: Self-pay

## 2021-03-09 ENCOUNTER — Ambulatory Visit (HOSPITAL_COMMUNITY)
Admission: RE | Admit: 2021-03-09 | Discharge: 2021-03-09 | Disposition: A | Discharge status: Home / Self Care | Payer: Medicaid Other | Source: Ambulatory Visit | Attending: Internal Medicine | Admitting: Internal Medicine

## 2021-03-09 ENCOUNTER — Other Ambulatory Visit: Payer: Self-pay

## 2021-03-09 ENCOUNTER — Ambulatory Visit (HOSPITAL_BASED_OUTPATIENT_CLINIC_OR_DEPARTMENT_OTHER)
Admission: RE | Admit: 2021-03-09 | Discharge: 2021-03-09 | Disposition: A | Discharge status: Home / Self Care | Payer: Medicaid Other | Source: Ambulatory Visit | Attending: Cardiology | Admitting: Cardiology

## 2021-03-09 VITALS — BP 140/108 | HR 86 | Wt 253.4 lb

## 2021-03-09 DIAGNOSIS — Z7901 Long term (current) use of anticoagulants: Secondary | ICD-10-CM | POA: Diagnosis not present

## 2021-03-09 DIAGNOSIS — R6884 Jaw pain: Secondary | ICD-10-CM | POA: Diagnosis not present

## 2021-03-09 DIAGNOSIS — R0602 Shortness of breath: Secondary | ICD-10-CM | POA: Insufficient documentation

## 2021-03-09 DIAGNOSIS — R0683 Snoring: Secondary | ICD-10-CM | POA: Insufficient documentation

## 2021-03-09 DIAGNOSIS — Z7982 Long term (current) use of aspirin: Secondary | ICD-10-CM | POA: Insufficient documentation

## 2021-03-09 DIAGNOSIS — M79602 Pain in left arm: Secondary | ICD-10-CM | POA: Insufficient documentation

## 2021-03-09 DIAGNOSIS — E0590 Thyrotoxicosis, unspecified without thyrotoxic crisis or storm: Secondary | ICD-10-CM | POA: Insufficient documentation

## 2021-03-09 DIAGNOSIS — Z79899 Other long term (current) drug therapy: Secondary | ICD-10-CM | POA: Insufficient documentation

## 2021-03-09 DIAGNOSIS — I5022 Chronic systolic (congestive) heart failure: Secondary | ICD-10-CM

## 2021-03-09 DIAGNOSIS — I110 Hypertensive heart disease with heart failure: Secondary | ICD-10-CM | POA: Insufficient documentation

## 2021-03-09 DIAGNOSIS — E669 Obesity, unspecified: Secondary | ICD-10-CM | POA: Diagnosis not present

## 2021-03-09 DIAGNOSIS — Z87891 Personal history of nicotine dependence: Secondary | ICD-10-CM | POA: Diagnosis not present

## 2021-03-09 DIAGNOSIS — I340 Nonrheumatic mitral (valve) insufficiency: Secondary | ICD-10-CM

## 2021-03-09 DIAGNOSIS — Z8249 Family history of ischemic heart disease and other diseases of the circulatory system: Secondary | ICD-10-CM | POA: Diagnosis not present

## 2021-03-09 DIAGNOSIS — I471 Supraventricular tachycardia: Secondary | ICD-10-CM | POA: Diagnosis not present

## 2021-03-09 DIAGNOSIS — I472 Ventricular tachycardia: Secondary | ICD-10-CM | POA: Diagnosis not present

## 2021-03-09 DIAGNOSIS — I081 Rheumatic disorders of both mitral and tricuspid valves: Secondary | ICD-10-CM | POA: Diagnosis not present

## 2021-03-09 LAB — ECHOCARDIOGRAM COMPLETE
AR max vel: 1.87 cm2
AV Area VTI: 1.71 cm2
AV Area mean vel: 1.75 cm2
AV Mean grad: 3 mmHg
AV Peak grad: 4.9 mmHg
Ao pk vel: 1.11 m/s
Area-P 1/2: 4.39 cm2
Calc EF: 38.8 %
MV M vel: 5.86 m/s
MV Peak grad: 137.4 mmHg
Radius: 0.8 cm
S' Lateral: 5.6 cm
Single Plane A2C EF: 40 %
Single Plane A4C EF: 45.6 %

## 2021-03-09 LAB — BASIC METABOLIC PANEL
Anion gap: 9 (ref 5–15)
BUN: 11 mg/dL (ref 6–20)
CO2: 19 mmol/L — ABNORMAL LOW (ref 22–32)
Calcium: 9 mg/dL (ref 8.9–10.3)
Chloride: 108 mmol/L (ref 98–111)
Creatinine, Ser: 0.68 mg/dL (ref 0.44–1.00)
GFR, Estimated: >60 mL/min (ref >60)
Glucose, Bld: 105 mg/dL — ABNORMAL HIGH (ref 70–99)
Potassium: 3.7 mmol/L (ref 3.5–5.1)
Sodium: 136 mmol/L (ref 135–145)

## 2021-03-09 LAB — TSH: TSH: <0.01 u[IU]/mL — ABNORMAL LOW (ref 0.350–4.500)

## 2021-03-09 LAB — BRAIN NATRIURETIC PEPTIDE: B Natriuretic Peptide: 501.9 pg/mL — ABNORMAL HIGH (ref 0.0–100.0)

## 2021-03-09 LAB — T4, FREE: Free T4: 1.7 ng/dL — ABNORMAL HIGH (ref 0.61–1.12)

## 2021-03-09 NOTE — Progress Notes (Signed)
  Echocardiogram 2D Echocardiogram has been performed.  Sarah Reilly F 03/09/2021, 9:03 AM

## 2021-03-09 NOTE — Patient Instructions (Signed)
It was great to see you today! No medication changes are needed at this time.   Labs today We will only contact you if something comes back abnormal or we need to make some changes. Otherwise no news is good news!   Your physician recommends that you schedule a follow-up appointment in: 2 months with Dr Gala Romney  Do the following things EVERYDAY: Weigh yourself in the morning before breakfast. Write it down and keep it in a log. Take your medicines as prescribed Eat low salt foods--Limit salt (sodium) to 2000 mg per day.  Stay as active as you can everyday Limit all fluids for the day to less than 2 liters

## 2021-03-10 ENCOUNTER — Telehealth (HOSPITAL_COMMUNITY): Payer: Self-pay | Admitting: Cardiology

## 2021-03-10 LAB — T3, FREE: T3, Free: 5 pg/mL — ABNORMAL HIGH (ref 2.0–4.4)

## 2021-03-10 MED ORDER — POTASSIUM CHLORIDE CRYS ER 20 MEQ PO TBCR
20.0000 meq | EXTENDED_RELEASE_TABLET | ORAL | 3 refills | Status: DC | PRN
Start: 2021-03-10 — End: 2021-10-29

## 2021-03-10 NOTE — Telephone Encounter (Signed)
-----   Message from Allayne Butcher, [place] sent at 03/09/2021  4:13 PM EDT ----- BNP (fluid level) is mildly elevated. Advise she take Lasix 40 mg daily x 3 days w/ 20 mEq of KCl x 3 days  Thyroid function test is also abnormal and needs to be addressed by PCP or endocrinology. She will need to see PCP and establish care w/ a new endocrinologist ASAP

## 2021-03-10 NOTE — Telephone Encounter (Signed)
Patient called.  Patient aware, patient voiced understanding, reports she discussed during office visit that she would take 7-10 doses of lasix and then return to prn. Again review orders/recommendations from provider. Voiced understanding

## 2021-03-14 ENCOUNTER — Other Ambulatory Visit (HOSPITAL_COMMUNITY): Payer: Self-pay | Admitting: Internal Medicine

## 2021-03-31 ENCOUNTER — Emergency Department (HOSPITAL_COMMUNITY)
Admission: EM | Admit: 2021-03-31 | Discharge: 2021-03-31 | Disposition: A | Discharge status: Home / Self Care | Payer: Medicaid Other | Attending: Emergency Medicine | Admitting: Emergency Medicine

## 2021-03-31 DIAGNOSIS — Z5321 Procedure and treatment not carried out due to patient leaving prior to being seen by health care provider: Secondary | ICD-10-CM | POA: Diagnosis not present

## 2021-03-31 DIAGNOSIS — R0781 Pleurodynia: Secondary | ICD-10-CM | POA: Diagnosis not present

## 2021-03-31 DIAGNOSIS — R0602 Shortness of breath: Secondary | ICD-10-CM | POA: Insufficient documentation

## 2021-03-31 NOTE — ED Notes (Signed)
Pt called for triage, no answer

## 2021-04-01 ENCOUNTER — Telehealth: Payer: Self-pay

## 2021-04-01 NOTE — Telephone Encounter (Signed)
Transition Care Management Unsuccessful Follow-up Telephone Call  Date of discharge and from where:  03/31/2021-[place]  Attempts:  1st Attempt  Reason for unsuccessful TCM follow-up call:  Unable to reach patient

## 2021-04-02 NOTE — Telephone Encounter (Signed)
Transition Care Management Unsuccessful Follow-up Telephone Call  Date of discharge and from where:  03/31/2021 from [facility]  Attempts:  2nd Attempt  Reason for unsuccessful TCM follow-up call:  Unable to reach patient

## 2021-04-05 NOTE — Telephone Encounter (Signed)
Transition Care Management Unsuccessful Follow-up Telephone Call  Date of discharge and from where:  03/31/2021 from [facility]  Attempts:  3rd Attempt  Reason for unsuccessful TCM follow-up call:  Unable to reach patient

## 2021-04-16 ENCOUNTER — Other Ambulatory Visit (HOSPITAL_COMMUNITY): Payer: Self-pay | Admitting: Internal Medicine

## 2021-05-13 ENCOUNTER — Encounter (HOSPITAL_COMMUNITY): Payer: Medicaid Other | Admitting: Internal Medicine

## 2021-07-02 ENCOUNTER — Other Ambulatory Visit (HOSPITAL_COMMUNITY): Payer: Self-pay | Admitting: Internal Medicine

## 2021-10-15 ENCOUNTER — Other Ambulatory Visit (HOSPITAL_COMMUNITY): Payer: Self-pay | Admitting: Cardiology

## 2021-10-15 ENCOUNTER — Encounter: Payer: Self-pay | Admitting: Cardiology

## 2021-10-15 ENCOUNTER — Telehealth: Payer: Self-pay | Admitting: Cardiology

## 2021-10-15 MED ORDER — FUROSEMIDE 40 MG PO TABS: ORAL_TABLET | ORAL | 2 refills | Status: DC

## 2021-10-15 NOTE — Telephone Encounter (Signed)
?*  STAT* If patient is at the pharmacy, call can be transferred to refill team. ? ? ?1. Which medications need to be refilled? (please list name of each medication and dose if known)  ?furosemide (LASIX) 40 MG tablet ? ?2. Which pharmacy/location (including street and city if local pharmacy) is medication to be sent to? ?[place], [place] ? ?3. Do they need a 30 day or 90 day supply?  ? ?90 day supply ?Patient states she is completely out of medication, but she is scheduled for 5/05 with Dr. Percival Spanish. ? ?

## 2021-10-18 ENCOUNTER — Other Ambulatory Visit: Payer: Self-pay

## 2021-10-18 MED ORDER — FUROSEMIDE 40 MG PO TABS: ORAL_TABLET | ORAL | 2 refills | Status: DC

## 2021-10-18 NOTE — Telephone Encounter (Signed)
Called patient left message to advise medication refill sent to pharmacy ?

## 2021-10-27 NOTE — Progress Notes (Signed)
?  ?Cardiology Office Note ? ? ?Date:  10/29/2021  ? ?ID:  Sarah Reilly, DOB Sep 13, 1980, MRN [id number] ? ?PCP:  [name], MD  ?Cardiologist:   Minus Breeding, MD ? ? ?Chief Complaint  ?Patient presents with  ? Edema  ? ? ?  ?History of Present Illness: ?Sarah Reilly is a 41 y.o. female who presents a hx of post partum cardiomyopathy dating back to 2010. She was followed in [place] for a time.  She declined an ICD.  She has been seen by myself and HF clinic with Dr Haroldine Laws after an echo in May 2019 showed her EF to be 20-25% with severe MR.  Dr Haroldine Laws recommended aggressive medical Rx and a repeat echo in 3 months, a cMRI and CPX.  The patient could not tolerate Entresto secondary to abdominal pain and was placed on Lisinopril.  Sleep study in Aug 2019 did not suggest she needed C-pap. Her last OV was Sept 2019.  She presented to the ED 11/28/2018 with epigastric pain and left AMA before troponin could be cycled. Her initial troponin was 0.08. Her chest CT was not normal-  "scattered, nonspecific infectious or inflammatory subpleural ground-glass pulmonary opacities bilaterally. There are a spectrum of findings in the lungs which can be seen with acute atypical infection (as well as other non-infectious etiologies). In particular, viral pneumonia (including COVID-19) should be considered in the appropriate clinical setting." She was afebrile and had essentially unremarkable BMP and CBC.  Her EKG was remarkable for sinus tachycardia and LVH. She did have a follow up echo and her EF was improved to 30 - 35%.  She was seen a couple of times virtually.  She was recently noted to have a very low TSH and elevated T4.    She has multiple no shows and non adherence with meds.  She has not been seen for chest pain a cardiologist since December 2021 when she was seen in the HF Clinic.   She did have her echo in September 2022. ? ?She has been out of her meds for a long time.  She called not long ago to get some  Lasix because she is having increasing shortness of breath.  She describes PND and orthopnea.  She is actually lost weight over time.  She did have lower extremity swelling that when she restarted 40 mg of Lasix this got better.  She has been out of her lisinopril and her beta-blocker.  She has been coughing up some bubbling white phlegm.  She says her situation is complicated by lack of transportation and she was late for the appointment today and had to call a cab.  She also has agoraphobia.  She is not describing presyncope or syncope.  She is not having fevers or chills.  She does have some sharp pain in her chest when she takes a deep breath in. ? ?Of note she has had problems with her thyroid but was dismissed from [facility] endocrinology.  She has not had this managed. ? ?Past Medical History:  ?Diagnosis Date  ? Cardiomyopathy   ? CHF (congestive heart failure) ([place])   ? HTN (hypertension)   ? Mitral regurgitation   ? ? ?History reviewed. No pertinent surgical history. ? ? ?Current Outpatient Medications  ?Medication Sig Dispense Refill  ? aspirin-acetaminophen-caffeine (EXCEDRIN MIGRAINE) 250-250-65 MG tablet Take 2 tablets by mouth every 6 (six) hours as needed for headache.    ? furosemide (LASIX) 40 MG tablet Take 40 mg twice  a day 180 tablet 3  ? lisinopril (ZESTRIL) 20 MG tablet Take 1 tablet (20 mg total) by mouth 2 (two) times daily. NEEDS OFFICE VISIT FOR MORE REFILLS 60 tablet 0  ? metoprolol succinate (TOPROL-XL) 50 MG 24 hr tablet TAKE 1 TABLET BY MOUTH TWICE DAILY **NEED  APPOINTMENT** 60 tablet 0  ? potassium chloride SA (KLOR-CON M) 20 MEQ tablet Take 1 tablet (20 mEq total) by mouth daily. 90 tablet 3  ? ?No current facility-administered medications for this visit.  ? ? ?Allergies:   Celery oil, Shellfish allergy, Entresto [sacubitril-valsartan], and Hydrocodone  ? ? ?ROS:  Please see the history of present illness.   Otherwise, review of systems are positive for none.   All other systems are  reviewed and negative.  ? ? ?PHYSICAL EXAM: ?VS:  BP (!) 118/100 (BP Location: Left Arm, Patient Position: Sitting, Cuff Size: Large)   Pulse (!) 120   Ht $R'5\' 7"'zf$  (1.702 m)   Wt 236 lb (107 kg)   BMI 36.96 kg/m?  , BMI Body mass index is 36.96 kg/m?. ?GENERAL:  Well appearing ?HEENT:  Pupils equal round and reactive, fundi not visualized, oral mucosa unremarkable, goiter ?NECK: Positive to the neck jugular venous distention at 45 degrees, waveform within normal limits, carotid upstroke brisk and symmetric, no bruits, no thyromegaly ?LYMPHATICS:  No cervical, inguinal adenopathy ?LUNGS: Bilateral basilar crackles ?BACK:  No CVA tenderness ?CHEST: Unremarkable ?HEART:  PMI not displaced or sustained,S1 and S2 within normal limits, no S3, no S4, no clicks, no rubs, 3 out of 6 holosystolic murmur at the apex and radiating to the axilla, murmurs ?ABD:  Flat, positive bowel sounds normal in frequency in pitch, no bruits, no rebound, no guarding, no midline pulsatile mass, no hepatomegaly, no splenomegaly ?EXT:  2 plus pulses throughout, trace edema, no cyanosis no clubbing ?SKIN:  No rashes no nodules ?NEURO:  Cranial nerves II through XII grossly intact, motor grossly intact throughout ?PSYCH:  Cognitively intact, oriented to person place and time ? ? ? ?EKG:  EKG is ordered today. ?The ekg ordered today demonstrates sinus tachycardia, rate 118, axis within normal limits, intervals within normal limits, no acute ST-T wave changes. ? ? ?Recent Labs: ?03/09/2021: B Natriuretic Peptide 501.9; BUN 11; Creatinine, Ser 0.68; Potassium 3.7; Sodium 136; TSH <0.010  ? ? ?Lipid Panel ?No results found for: CHOL, TRIG, HDL, CHOLHDL, VLDL, LDLCALC, LDLDIRECT ?  ? ?Wt Readings from Last 3 Encounters:  ?10/29/21 236 lb (107 kg)  ?03/09/21 253 lb 6.4 oz (114.9 kg)  ?06/04/20 251 lb 3.2 oz (113.9 kg)  ?  ? ? ?Other studies Reviewed: ?Additional studies/ records that were reviewed today include: Labs from previous HF Clinic  notes. ?Review of the above records demonstrates:  Please see elsewhere in the note.   ? ? ?ASSESSMENT AND PLAN: ? ?ACUTE ON CHRONIC SYSTOLIC HF:    She has acute on chronic CHF.  I think she needs to be hospitalized but she would not want to do this.  She refuses hospitalization.  We talked again about salt and fluid restriction.  I am going to get labs to include a c-Met, CBC, BMP.  She will need a repeat echocardiogram.  I am going to go ahead and restart lisinopril at 20 mg twice daily.  I will hold off on beta-blocker as I think she is somewhat decompensated but she needs to come back next week to get her volume checked again.  She needs to double her  Lasix to 40 mg twice daily.   We will give her 20 mill equivalents of potassium but again we will be checking labs today.  Previous assessment was that she would not really be a good candidate for SGLT2 inhibitor because of frequent pyelonephritis.  She has refused an ICD.  She was intolerant of spironolactone and Entresto in the past.    ?  ?MR:  Moderate to severe.  I suspect this is worsening.  She was going to be considered potentially in the future for mitral valve clip which she has refused TEE.  ?  ?ABNORMAL TSH:   She has not had any therapy of this.  I will repeat the TSH.  ?  ? ? ?Current medicines are reviewed at length with the patient today.  The patient does not have concerns regarding medicines. ? ?The following changes have been made: As above ? ?Labs/ tests ordered today include:  ? ?Orders Placed This Encounter  ?Procedures  ? DG Chest 2 View  ? Comprehensive Metabolic Panel (CMET)  ? CBC w/Diff/Platelet  ? B Nat Peptide  ? TSH  ? EKG 12-Lead  ? ? ? ?Disposition:   FU with me next week ? ? ?Signed, ?Minus Breeding, MD  ?10/29/2021 9:11 AM    ?[place] ? ? ? ?

## 2021-10-29 ENCOUNTER — Telehealth: Payer: Self-pay | Admitting: Licensed Clinical Social Worker

## 2021-10-29 ENCOUNTER — Ambulatory Visit (INDEPENDENT_AMBULATORY_CARE_PROVIDER_SITE_OTHER): Payer: Medicaid Other | Admitting: Cardiology

## 2021-10-29 ENCOUNTER — Telehealth: Payer: Self-pay | Admitting: Cardiology

## 2021-10-29 ENCOUNTER — Encounter: Payer: Self-pay | Admitting: Cardiology

## 2021-10-29 ENCOUNTER — Ambulatory Visit
Admission: RE | Admit: 2021-10-29 | Discharge: 2021-10-29 | Disposition: A | Discharge status: Home / Self Care | Payer: Medicaid Other | Source: Ambulatory Visit | Attending: Cardiology | Admitting: Cardiology

## 2021-10-29 VITALS — BP 118/100 | HR 120 | Ht 67.0 in | Wt 236.0 lb

## 2021-10-29 DIAGNOSIS — I5022 Chronic systolic (congestive) heart failure: Secondary | ICD-10-CM | POA: Diagnosis not present

## 2021-10-29 DIAGNOSIS — R0602 Shortness of breath: Secondary | ICD-10-CM

## 2021-10-29 DIAGNOSIS — I340 Nonrheumatic mitral (valve) insufficiency: Secondary | ICD-10-CM

## 2021-10-29 DIAGNOSIS — R059 Cough, unspecified: Secondary | ICD-10-CM | POA: Diagnosis not present

## 2021-10-29 DIAGNOSIS — E0590 Thyrotoxicosis, unspecified without thyrotoxic crisis or storm: Secondary | ICD-10-CM

## 2021-10-29 DIAGNOSIS — I10 Essential (primary) hypertension: Secondary | ICD-10-CM

## 2021-10-29 MED ORDER — POTASSIUM CHLORIDE CRYS ER 20 MEQ PO TBCR: 20.0000 meq | EXTENDED_RELEASE_TABLET | Freq: Every day | ORAL | 3 refills | Status: DC

## 2021-10-29 MED ORDER — FUROSEMIDE 40 MG PO TABS: ORAL_TABLET | ORAL | 3 refills | Status: DC

## 2021-10-29 NOTE — Telephone Encounter (Signed)
[facility] calling with a call report for this patient  °

## 2021-10-29 NOTE — Patient Instructions (Signed)
Medication Instructions:  ?Take Lasix 40 mg twice a day ?Take Lisinopril 20 mg twice a day ?Start Potassium 20 meq daily ?Continue all other medications ?*If you need a refill on your cardiac medications before your next appointment, please call your pharmacy* ? ? ?Lab Work: ?Cmet,cbc,bnp,tsh  today ? ? ?Testing/Procedures: ?Chest xray  today at [facility]. ? ? ?Follow-Up: ?At [facility], you and your health needs are our priority.  As part of our continuing mission to provide you with exceptional heart care, we have created designated Provider Care Teams.  These Care Teams include your primary Cardiologist (physician) and Advanced Practice Providers (APPs -  Physician Assistants and Nurse Practitioners) who all work together to provide you with the care you need, when you need it. ? ?We recommend signing up for the patient portal called "MyChart".  Sign up information is provided on this After Visit Summary.  MyChart is used to connect with patients for Virtual Visits (Telemedicine).  Patients are able to view lab/test results, encounter notes, upcoming appointments, etc.  Non-urgent messages can be sent to your provider as well.   ?To learn more about what you can do with MyChart, go to [email or web address].   ? ?Your next appointment:  1 week ?  ? ?The format for your next appointment:Office   ? ? ?Provider:  Dr.Hochrein ? ? ?Important Information About Sugar ? ? ? ? ? ? ?

## 2021-10-29 NOTE — Telephone Encounter (Signed)
Spoke with [facility] about call report:  ? ?FINDINGS: ?Cardiomegaly. Minimal right basilar opacities. No pleural effusion ?or pneumothorax. Osseous structures unremarkable. There is a 1 cm ?nodular opacity projecting over the left hemithorax. ?  ?IMPRESSION: ?1 cm nodular opacity projecting over the left hemithorax. Recommend ?further evaluation with chest CT. ?  ?Right basilar opacities may represent scarring or atelectasis. ?  ?These results will be called to the ordering clinician or ?representative by the Radiologist Assistant, and communication ?documented in the PACS or [employer]. ?  ?  ?Electronically Signed ?  By: Lovey Newcomer M.D. ?  On: 10/29/2021 16:25 ?

## 2021-10-29 NOTE — Progress Notes (Signed)
?Heart and Vascular Care Navigation ? ?10/29/2021 ? ?Sarah Reilly ?[birth year] ?[id number] ? ?Reason for Referral:  ?Urgent Transportation ?Engaged with patient face to face for initial visit for Heart and Vascular Care Coordination. ?                                                                                                  ?Assessment:  ?LCSW assisted pt with transportation to [facility] today for urgent chest xray. I introduced self, role, reason for consult. Pt lives at home with her children, and the father of her children assists her as needed with walking to pick up medications etc. Pt has had previous involvement with Care Navigation team due to concerns with housing and transportation. Pt has Medicaid transportation that can be utilized to get to and from appointments. I provided her with information about [facility]. Pt states she never got a card from [facility]. Encouraged her to request one when she speaks with customer service number. Pt is able to utilize this transportation for appt on 5/12. I mailed this information also to her home. She denies additional medication access and affordability concerns to me (but did admit to them to MD). I have provided pt with [employer] cab ride to [facility]. Pt was instructed to let me know when she was done with appt for ride home. Again encouraged her to call today to schedule ride for appointment on 5/12.                                    ? ?HRT/VAS Care Coordination   ? ? Patients Home Cardiology Office [facility]  ? Outpatient Care Team Social Worker  ? Social Worker Name: Esmeralda [facility] [phone]  ? Living arrangements for the past 2 months Apartment  ? Lives with: Minor Children  ? Patient Current Insurance Coverage Medicaid  ? Patient Has Concern With Paying Medical Bills No  ? Does Patient Have Prescription Coverage? Yes  ? ?  ? ? ?Social History:                                                                              ?SDOH Screenings  ? ?Alcohol Screen: Not on file  ?Depression (PHQ2-9): Not on file  ?Financial Resource Strain: Not on file  ?Food Insecurity: Not on file  ?Housing: Not on file  ?Physical Activity: Not on file  ?Social Connections: Not on file  ?Stress: Not on file  ?Tobacco Use: Medium Risk  ? Smoking Tobacco Use: Former  ? Smokeless Tobacco Use: Never  ? Passive Exposure: Not on file  ?Transportation Needs: Unmet Transportation Needs  ? Lack of Transportation (Medical): Yes  ?  Lack of Transportation (Non-Medical): Yes  ? ? ?SDOH Interventions: ?Transportation:   Transportation Interventions: Conservation officer, nature Given, Associate Professor (LCSW provided taxi voucher to get to [employer] and home, provided information about [employer] transport for appts moving forward.)  ? ? ? ?Other Care Navigation Interventions:    ? ?Provided Pharmacy assistance resources  Stop at pharmacy via taxi authorized.   ? ?Follow-up plan:   ?LCSW confirmed pt arrival at [facility], when appointment was done I was able to get her a ride home with a pharmacy stop to pick her medications up. I will try and f/u before appointment on 5/12 to remind her to schedule her ride at least 48 hours before appt.  ? ? ? ?

## 2021-10-30 ENCOUNTER — Telehealth: Payer: Self-pay | Admitting: Physician Assistant

## 2021-10-30 LAB — CBC WITH DIFFERENTIAL/PLATELET
Basophils Absolute: 0.1 10*3/uL (ref 0.0–0.2)
Basos: 1 %
EOS (ABSOLUTE): 0.1 10*3/uL (ref 0.0–0.4)
Eos: 2 %
Hematocrit: 43.1 % (ref 34.0–46.6)
Hemoglobin: 14 g/dL (ref 11.1–15.9)
Immature Grans (Abs): 0 10*3/uL (ref 0.0–0.1)
Immature Granulocytes: 0 %
Lymphocytes Absolute: 2 10*3/uL (ref 0.7–3.1)
Lymphs: 45 %
MCH: 25.2 pg — ABNORMAL LOW (ref 26.6–33.0)
MCHC: 32.5 g/dL (ref 31.5–35.7)
MCV: 78 fL — ABNORMAL LOW (ref 79–97)
Monocytes Absolute: 0.4 10*3/uL (ref 0.1–0.9)
Monocytes: 8 %
Neutrophils Absolute: 2 10*3/uL (ref 1.4–7.0)
Neutrophils: 44 %
Platelets: 205 10*3/uL (ref 150–450)
RBC: 5.56 x10E6/uL — ABNORMAL HIGH (ref 3.77–5.28)
RDW: 13.6 % (ref 11.7–15.4)
WBC: 4.5 10*3/uL (ref 3.4–10.8)

## 2021-10-30 LAB — COMPREHENSIVE METABOLIC PANEL
ALT: 32 IU/L (ref 0–32)
AST: 34 IU/L (ref 0–40)
Albumin/Globulin Ratio: 1.5 (ref 1.2–2.2)
Albumin: 4.1 g/dL (ref 3.8–4.8)
Alkaline Phosphatase: 59 IU/L (ref 44–121)
BUN/Creatinine Ratio: 14 (ref 9–23)
BUN: 10 mg/dL (ref 6–24)
Bilirubin Total: 1.6 mg/dL — ABNORMAL HIGH (ref 0.0–1.2)
CO2: 18 mmol/L — ABNORMAL LOW (ref 20–29)
Calcium: 9.4 mg/dL (ref 8.7–10.2)
Chloride: 97 mmol/L (ref 96–106)
Creatinine, Ser: 0.74 mg/dL (ref 0.57–1.00)
Globulin, Total: 2.7 g/dL (ref 1.5–4.5)
Glucose: 124 mg/dL — ABNORMAL HIGH (ref 70–99)
Potassium: 3.1 mmol/L — ABNORMAL LOW (ref 3.5–5.2)
Sodium: 134 mmol/L (ref 134–144)
Total Protein: 6.8 g/dL (ref 6.0–8.5)
eGFR: 104 mL/min/{1.73_m2} (ref >59)

## 2021-10-30 LAB — BRAIN NATRIURETIC PEPTIDE: BNP: 358.5 pg/mL — ABNORMAL HIGH (ref 0.0–100.0)

## 2021-10-30 LAB — TSH: TSH: <0.005 u[IU]/mL — ABNORMAL LOW (ref 0.450–4.500)

## 2021-10-30 MED ORDER — LISINOPRIL 20 MG PO TABS: 20.0000 mg | ORAL_TABLET | Freq: Two times a day (BID) | ORAL | 3 refills | Status: DC

## 2021-10-30 NOTE — Telephone Encounter (Signed)
Pt seen yesterday by Dr. Percival Spanish. ?Lisinopril was to be resumed.  Pt called answering service.  Lisinopril was not sent to her pharmacy. ?Rx sent to pharmacy for refill. ?Richardson Dopp, PA-C    ?10/30/2021 12:13 PM   ?

## 2021-11-01 ENCOUNTER — Telehealth: Payer: Self-pay | Admitting: Licensed Clinical Social Worker

## 2021-11-01 NOTE — Telephone Encounter (Signed)
Sarah Rotunda, MD  Lindell Spar, RN ?Caller: Unspecified (3 days ago,  4:49 PM) ?I will see her back next week and consider sending her for CT if she would consent.  ?

## 2021-11-01 NOTE — Telephone Encounter (Signed)
LCSW received a text this morning from pt, she had texted my work phone Saturday afternoon regarding missing medications. I responded to pt sharing that work phone not monitored outside of business hours. Encouraged her to contact the on call provider on weekends as needed for things such as medication refills. LCSW requested that pt contact nurse triage if still needed for medication- pt responded stating that she was able to reach the oncall provider and have medication sent.  ? ?I encouraged pt also contact [employer] [facility] to arrange a ride to her appointment on 5/12. Pt states "ok". I remain available. ? ?Westley Hummer, MSW, LCSW ?Clinical Social Worker II ?[place] Heart/Vascular Care Navigation  ?[phone]- work cell phone (preferred) ?[phone]- desk phone ? ?

## 2021-11-03 ENCOUNTER — Telehealth: Payer: Self-pay | Admitting: Licensed Clinical Social Worker

## 2021-11-03 ENCOUNTER — Telehealth: Payer: Self-pay | Admitting: Cardiology

## 2021-11-03 NOTE — Telephone Encounter (Signed)
LCSW sent f/u text to pt to confirm transportation to appt on 5/12. Pt texted back stating she has a ride. I remain available.  ? ?Octavio Graves, MSW, LCSW ?Clinical Social Worker II ?[place] Heart/Vascular Care Navigation  ?[phone]- work cell phone (preferred) ?[phone]- desk phone ? ?

## 2021-11-03 NOTE — Telephone Encounter (Signed)
Returned patient called and informed her that results will be reviewed in detail during her f/u visit on 5/12. Agreed and not additional questions at this time.  ?

## 2021-11-03 NOTE — Telephone Encounter (Signed)
Pt would like a call from nurse to explain test results. Please advise ?

## 2021-11-04 NOTE — Progress Notes (Signed)
?  ?Cardiology Office Note ? ? ?Date:  11/05/2021  ? ?ID:  Sarah Reilly, DOB 15-Aug-1980, MRN [id number] ? ?PCP:  [name], MD  ?Cardiologist:   Minus Breeding, MD ? ? ?Chief Complaint  ?Patient presents with  ? Shortness of Breath  ? Edema  ? ? ?  ?History of Present Illness: ?Sarah Reilly is a 41 y.o. female who presents a hx of post partum cardiomyopathy dating back to 2010. She was followed in [place] for a time.  She declined an ICD.  She has been seen by myself and HF clinic with Dr Haroldine Laws after an echo in May 2019 showed her EF to be 20-25% with severe MR.  Dr Haroldine Laws recommended aggressive medical Rx and a repeat echo in 3 months, a cMRI and CPX.  The patient could not tolerate Entresto secondary to abdominal pain and was placed on Lisinopril.  Sleep study in Aug 2019 did not suggest she needed C-pap. Her last OV was Sept 2019.  She presented to the ED 11/28/2018 with epigastric pain and left AMA before troponin could be cycled. Her initial troponin was 0.08. Her chest CT was not normal-  "scattered, nonspecific infectious or inflammatory subpleural ground-glass pulmonary opacities bilaterally. There are a spectrum of findings in the lungs which can be seen with acute atypical infection (as well as other non-infectious etiologies). In particular, viral pneumonia (including COVID-19) should be considered in the appropriate clinical setting." She was afebrile and had essentially unremarkable BMP and CBC.  Her EKG was remarkable for sinus tachycardia and LVH. She did have a follow up echo and her EF was improved to 30 - 35%.  She was seen a couple of times virtually.  She was recently noted to have a very low TSH and elevated T4.    She has multiple no shows and non adherence with meds.  She has not been seen for chest pain a cardiologist since December 2021 when she was seen in the HF Clinic.   She did have her echo in September 2022. ? ?When I saw her recently she was out of meds although she  tells me today that she did have her Lasix.  I restarted lisinopril.  She refused admission at that time.  She did get a chest x-ray.  This demonstrated no pleural effusion.  There was actually no overt heart failure.  She did have a 1 cm nodular opacity projecting over the left hemithorax with CT suggested.  She has had some discomfort but its been on the right side.  She has not had any relief of any of her symptoms.  She did get a ride today and please that she is at the appointment.  She actually consents to being admitted.  She is still having PND.  She has cough anytime she tries to lie down or take a deep breath.  She is lost a few pounds but she still has increased lower extremity swelling.  She is tachycardic.  She is fatigued.  She is not having any chest discomfort, neck or arm discomfort.  She did have a low potassium noted on the labs I drew last week.  However, she was called into the to have some potassium supplements but she threw them all up when she tried to take them.  She starts coughing and she thinks she has been throwing up her diuretic as well.   ? ?Of note she has had problems with her thyroid but was dismissed from [facility]  endocrinology.  She has not had this managed.  TSH that I drew last week was less than 0.005 ? ?Past Medical History:  ?Diagnosis Date  ? Cardiomyopathy   ? CHF (congestive heart failure) ([place])   ? HTN (hypertension)   ? Mitral regurgitation   ? ? ?History reviewed. No pertinent surgical history. ? ? ?Current Outpatient Medications  ?Medication Sig Dispense Refill  ? aspirin-acetaminophen-caffeine (EXCEDRIN MIGRAINE) 250-250-65 MG tablet Take 2 tablets by mouth every 6 (six) hours as needed for headache.    ? furosemide (LASIX) 40 MG tablet Take 40 mg twice a day 180 tablet 3  ? lisinopril (ZESTRIL) 20 MG tablet Take 1 tablet (20 mg total) by mouth 2 (two) times daily. 180 tablet 3  ? metoprolol succinate (TOPROL-XL) 50 MG 24 hr tablet TAKE 1 TABLET BY MOUTH TWICE DAILY  **NEED  APPOINTMENT** 60 tablet 0  ? potassium chloride SA (KLOR-CON M) 20 MEQ tablet Take 1 tablet (20 mEq total) by mouth daily. 90 tablet 3  ? ?No current facility-administered medications for this visit.  ? ?Family History  ?Problem Relation Age of Onset  ? Heart disease Mother   ?     No details  ? Hypertension Mother   ? Diabetes Mother   ? Hypertension Father   ? Heart murmur Sister   ? ? ?Social History  ? ?Tobacco Use  ? Smoking status: Former  ? Smokeless tobacco: Never  ?Substance Use Topics  ? Alcohol use: Yes  ?  Alcohol/week: 4.0 standard drinks  ?  Types: 3 Glasses of wine, 1 Standard drinks or equivalent per week  ? Drug use: Never  ? ? ?Allergies:   Celery oil, Shellfish allergy, Entresto [sacubitril-valsartan], and Hydrocodone  ? ? ?ROS:  Please see the history of present illness.   Otherwise, review of systems are positive for none.   All other systems are reviewed and negative.  ? ? ?PHYSICAL EXAM: ?VS:  BP (!) 126/98 (BP Location: Left Arm, Patient Position: Sitting, Cuff Size: Normal)   Pulse (!) 121   Ht 5\' 7"  (1.702 m)   Wt 233 lb (105.7 kg)   BMI 36.49 kg/m?  , BMI Body mass index is 36.49 kg/m?. ?GENERAL:  Well appearing ?HEENT:  Pupils equal round and reactive, fundi not visualized, oral mucosa unremarkable ?NECK:  No jugular venous distention, waveform within normal limits, carotid upstroke brisk and symmetric, no bruits, no thyromegaly ?LYMPHATICS:  No cervical, inguinal adenopathy ?LUNGS:  Clear to auscultation bilaterally ?BACK:  No CVA tenderness ?CHEST:  Unremarkable ?HEART:  PMI not displaced or sustained,S1 and S2 within normal limits, no S3, no S4, no clicks, no rubs, holosystolic murmur heard over the entire precordium and in the back, no diastolic murmurs ?ABD:  Flat, positive bowel sounds normal in frequency in pitch, no bruits, no rebound, no guarding, no midline pulsatile mass, no hepatomegaly, no splenomegaly ?EXT:  2 plus pulses throughout, moderate bilateral lower  extremity edema, no cyanosis no clubbing ?SKIN:  No rashes no nodules ?NEURO:  Cranial nerves II through XII grossly intact, motor grossly intact throughout ?PSYCH:  Cognitively intact, oriented to person place and time ? ? ?EKG:  EKG is not ordered today. ?The ekg ordered 10/29/2021 demonstrates sinus tachycardia, rate 115, axis within normal limits, intervals within normal limits, no acute ST-T wave changes. ? ? ?Recent Labs: ?10/29/2021: ALT 32; BNP 358.5; BUN 10; Creatinine, Ser 0.74; Hemoglobin 14.0; Platelets 205; Potassium 3.1; Sodium 134; TSH <0.005  ? ? ?Lipid  Panel ?No results found for: CHOL, TRIG, HDL, CHOLHDL, VLDL, LDLCALC, LDLDIRECT ?  ? ?Wt Readings from Last 3 Encounters:  ?11/05/21 233 lb (105.7 kg)  ?10/29/21 236 lb (107 kg)  ?03/09/21 253 lb 6.4 oz (114.9 kg)  ?  ? ? ?Other studies Reviewed: ?Additional studies/ records that were reviewed today include: Labs ?Review of the above records demonstrates:  Please see elsewhere in the note.   ? ? ?ASSESSMENT AND PLAN: ? ?ACUTE ON CHRONIC SYSTOLIC HF:     The patient has decompensated systolic acute congestive heart failure and she consents now to admission.  I talked with the Dr. Haroldine Laws who will see the patient.     She is likely to need advanced therapy.  She will need IV diuretics and perhaps inotropic therapy.  She will need repeat echocardiography.  ?  ?MR:  Moderate to severe.  I suspect this is worse but she had previously refused consideration for MitraClip and TEE.   ? ?HYPOKALEMIA: This of course will be checked in the hospital.  She will need supplementation. ?  ?ABNORMAL TSH: She is hyperthyroid and has been noncompliant with therapies.   ? ?LEFT HEMITHORAX NODULE: She will need CT for further evaluation.  She had previously been treated with methimazole.  She has not been taking this.  Does not look like she has seen endocrinology since 2020 and she was discharged from [facility] endocrinology. ? ? ?Current medicines are reviewed at length with  the patient today.  The patient does not have concerns regarding medicines. ? ?The following changes have been made:  None ? ?Labs/ tests ordered today include: None ? ?No orders of the defined types were plac

## 2021-11-05 ENCOUNTER — Encounter: Payer: Self-pay | Admitting: Cardiology

## 2021-11-05 ENCOUNTER — Telehealth: Payer: Self-pay | Admitting: Licensed Clinical Social Worker

## 2021-11-05 ENCOUNTER — Inpatient Hospital Stay (HOSPITAL_COMMUNITY)
Admission: AD | Admit: 2021-11-05 | Discharge: 2021-11-09 | DRG: 291 | Disposition: A | Discharge status: Home / Self Care | Payer: Medicaid Other | Source: Ambulatory Visit | Attending: Internal Medicine | Admitting: Internal Medicine

## 2021-11-05 ENCOUNTER — Ambulatory Visit (INDEPENDENT_AMBULATORY_CARE_PROVIDER_SITE_OTHER): Payer: Medicaid Other | Admitting: Cardiology

## 2021-11-05 ENCOUNTER — Inpatient Hospital Stay: Payer: Self-pay

## 2021-11-05 VITALS — BP 126/98 | HR 121 | Ht 67.0 in | Wt 233.0 lb

## 2021-11-05 DIAGNOSIS — Z91148 Patient's other noncompliance with medication regimen for other reason: Secondary | ICD-10-CM

## 2021-11-05 DIAGNOSIS — Z91199 Patient's noncompliance with other medical treatment and regimen due to unspecified reason: Secondary | ICD-10-CM | POA: Diagnosis not present

## 2021-11-05 DIAGNOSIS — Z79899 Other long term (current) drug therapy: Secondary | ICD-10-CM

## 2021-11-05 DIAGNOSIS — I340 Nonrheumatic mitral (valve) insufficiency: Principal | ICD-10-CM | POA: Diagnosis present

## 2021-11-05 DIAGNOSIS — Z8249 Family history of ischemic heart disease and other diseases of the circulatory system: Secondary | ICD-10-CM

## 2021-11-05 DIAGNOSIS — K30 Functional dyspepsia: Secondary | ICD-10-CM | POA: Diagnosis present

## 2021-11-05 DIAGNOSIS — I110 Hypertensive heart disease with heart failure: Secondary | ICD-10-CM | POA: Diagnosis not present

## 2021-11-05 DIAGNOSIS — Z833 Family history of diabetes mellitus: Secondary | ICD-10-CM | POA: Diagnosis not present

## 2021-11-05 DIAGNOSIS — E669 Obesity, unspecified: Secondary | ICD-10-CM | POA: Diagnosis present

## 2021-11-05 DIAGNOSIS — E0500 Thyrotoxicosis with diffuse goiter without thyrotoxic crisis or storm: Secondary | ICD-10-CM | POA: Diagnosis not present

## 2021-11-05 DIAGNOSIS — Z20822 Contact with and (suspected) exposure to covid-19: Secondary | ICD-10-CM | POA: Diagnosis present

## 2021-11-05 DIAGNOSIS — I10 Essential (primary) hypertension: Secondary | ICD-10-CM

## 2021-11-05 DIAGNOSIS — Z6835 Body mass index (BMI) 35.0-35.9, adult: Secondary | ICD-10-CM

## 2021-11-05 DIAGNOSIS — I5023 Acute on chronic systolic (congestive) heart failure: Secondary | ICD-10-CM

## 2021-11-05 DIAGNOSIS — R17 Unspecified jaundice: Secondary | ICD-10-CM | POA: Diagnosis not present

## 2021-11-05 DIAGNOSIS — R000 Tachycardia, unspecified: Secondary | ICD-10-CM | POA: Diagnosis not present

## 2021-11-05 DIAGNOSIS — I081 Rheumatic disorders of both mitral and tricuspid valves: Secondary | ICD-10-CM | POA: Diagnosis not present

## 2021-11-05 DIAGNOSIS — I428 Other cardiomyopathies: Secondary | ICD-10-CM | POA: Diagnosis present

## 2021-11-05 DIAGNOSIS — E0590 Thyrotoxicosis, unspecified without thyrotoxic crisis or storm: Secondary | ICD-10-CM | POA: Diagnosis present

## 2021-11-05 DIAGNOSIS — Z7189 Other specified counseling: Secondary | ICD-10-CM

## 2021-11-05 LAB — COOXEMETRY PANEL
Carboxyhemoglobin: 1.7 % — ABNORMAL HIGH (ref 0.5–1.5)
Methemoglobin: <0.7 % (ref 0.0–1.5)
O2 Saturation: 73.9 %
Total hemoglobin: 13.6 g/dL (ref 12.0–16.0)

## 2021-11-05 LAB — BRAIN NATRIURETIC PEPTIDE: B Natriuretic Peptide: 509.5 pg/mL — ABNORMAL HIGH (ref 0.0–100.0)

## 2021-11-05 MED ORDER — SODIUM CHLORIDE 0.9% FLUSH
3.0000 mL | INTRAVENOUS | Status: DC | PRN
Start: 2021-11-05 — End: 2021-11-09

## 2021-11-05 MED ORDER — POTASSIUM CHLORIDE CRYS ER 20 MEQ PO TBCR
40.0000 meq | EXTENDED_RELEASE_TABLET | Freq: Two times a day (BID) | ORAL | Status: DC
  Administered 2021-11-05 – 2021-11-09 (×8): 40 meq via ORAL
  Filled 2021-11-05 (×8): qty 2

## 2021-11-05 MED ORDER — SODIUM CHLORIDE 0.9% FLUSH
3.0000 mL | Freq: Two times a day (BID) | INTRAVENOUS | Status: DC
  Administered 2021-11-05 – 2021-11-09 (×5): 3 mL via INTRAVENOUS

## 2021-11-05 MED ORDER — SPIRONOLACTONE 25 MG PO TABS
25.0000 mg | ORAL_TABLET | Freq: Every day | ORAL | Status: DC
Start: 2021-11-05 — End: 2021-11-09
  Administered 2021-11-05 – 2021-11-09 (×5): 25 mg via ORAL
  Filled 2021-11-05 (×5): qty 1

## 2021-11-05 MED ORDER — DIGOXIN 125 MCG PO TABS
0.1250 mg | ORAL_TABLET | Freq: Every day | ORAL | Status: DC
  Administered 2021-11-05 – 2021-11-09 (×5): 0.125 mg via ORAL
  Filled 2021-11-05 (×5): qty 1

## 2021-11-05 MED ORDER — ENOXAPARIN SODIUM 40 MG/0.4ML IJ SOSY
40.0000 mg | PREFILLED_SYRINGE | INTRAMUSCULAR | Status: DC
  Administered 2021-11-06 – 2021-11-08 (×3): 40 mg via SUBCUTANEOUS
  Filled 2021-11-05 (×4): qty 0.4

## 2021-11-05 MED ORDER — SODIUM CHLORIDE 0.9 % IV SOLN: 250.0000 mL | INTRAVENOUS | Status: DC | PRN

## 2021-11-05 MED ORDER — ONDANSETRON HCL 4 MG/2ML IJ SOLN: 4.0000 mg | Freq: Four times a day (QID) | INTRAMUSCULAR | Status: DC | PRN

## 2021-11-05 MED ORDER — ACETAMINOPHEN 325 MG PO TABS
650.0000 mg | ORAL_TABLET | ORAL | Status: DC | PRN
  Administered 2021-11-05 – 2021-11-06 (×3): 650 mg via ORAL
  Filled 2021-11-05 (×4): qty 2

## 2021-11-05 MED ORDER — FUROSEMIDE 10 MG/ML IJ SOLN
80.0000 mg | Freq: Two times a day (BID) | INTRAMUSCULAR | Status: AC
  Administered 2021-11-05 – 2021-11-08 (×6): 80 mg via INTRAVENOUS
  Filled 2021-11-05 (×7): qty 8

## 2021-11-05 NOTE — H&P (Addendum)
? ?Advanced Heart Failure Team History and Physical Note ?  ?PCP:  [name], MD  ?PCP-Cardiology: Minus Breeding, MD    ? ?Reason for Admission: A/C HFrEF  ? ? ?HPI:   ? ?Sarah Reilly is a 41 y.o. 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  ?  ?She developed HF after the birth of her 40th child in 2010. She was in [place] at the time. Saw Dr. Percival Spanish EF 20%. Did not have a heart cath.  She then moved back to [place] to be by her mother.  She was followed at [place] by Dr. Sheliah Mends.  She had a high-risk pregnancy and had her 5th Child in 90. Apparently EF dropped to 15-20% for a period. They suggested ICD but she refused. She returned to [place]. ?  ?In July 2019 she was diagnosed with hyperthyroidism. Started on methimazole bu Dr Dwyane Dee.  ?  ?Previously she was having CP in setting of 30 pound weight gain. Dr. Haroldine Laws suggested R/L cath but she refused saying she wanted to try and lose weight first and see how she felt. We eventually rescheduled but she couldn't get COVID test.  ?  ?She was seen [facility] and was quite tachycardic. She had not seen Dr. Dwyane Dee since 12/20. Ran out of methimazole refils. 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  TSH < 0.010. She has not followed up with Endo. On the chart there is a dismissal letter from Dr. Dwyane Dee dated 02/27/20. ?  ?Had f/u echo 04/02/20 EF 35-40%, RV normal. Mod-severe MR w/ tethering of posterior leaflet.  ? ?Echo 02/2021 -EF 30-35% RV mildly. Bi atrial enlargement.  ?  ?She has missed multiple appointments. Of note she has had problems with her thyroid but was dismissed from [facility] endocrinology.  She has not had this managed.  TSH 10/29/21 0.005.  ? ?Saw Dr Percival Spanish earlier today with increased shortness of breath, orthopnea, and PND. Admitted with A/C HFrEF.    ? ? ?Review of Systems: [y] = yes, [ ]  = no  ? ?General: Weight gain [ ] ; Weight loss [ ] ; Anorexia [ ] ; Fatigue [ Y]; Fever [ ] ; Chills [ ] ; Weakness [ Y]  ?Cardiac: Chest pain/pressure [ ] ; Resting SOB [ ] ; Exertional SOB [ Y]; Orthopnea [ Y]; Pedal Edema [Y ]; Palpitations [ ] ; Syncope [ ] ; Presyncope [ ] ; Paroxysmal nocturnal dyspnea[ ]   ?Pulmonary: Cough [ ] ; Wheezing[ ] ; Hemoptysis[ ] ; Sputum [ ] ; Snoring [ ]   ?GI: Vomiting[ ] ; Dysphagia[ ] ; Melena[ ] ; Hematochezia [ ] ; Heartburn[ ] ; Abdominal pain [ ] ; Constipation [ ] ; Diarrhea [ ] ; BRBPR [ ]   ?GU: Hematuria[ ] ; Dysuria [ ] ; Nocturia[ ]   ?Vascular: Pain in legs with walking [ ] ; Pain in feet with lying flat [ ] ; Non-healing sores [ ] ; Stroke [ ] ; TIA [ ] ; Slurred speech [ ] ;  ?Neuro: Headaches[ ] ; Vertigo[ ] ; Seizures[ ] ; Paresthesias[ ] ;Blurred vision [ ] ; Diplopia [ ] ; Vision changes [ ]   ?Ortho/Skin: Arthritis [ ] ; Joint pain [ Y]; Muscle pain [ ] ; Joint swelling [ ] ; Back Pain [ ] ; Rash [ ]   ?Psych: Depression[ ] ; Anxiety[ ]   ?Heme: Bleeding problems [ ] ; Clotting disorders [ ] ; Anemia [ ]   ?Endocrine: Diabetes [ ] ; Thyroid dysfunction[ ]  ? ? ?Home Medications ?Prior to Admission medications   ?  Medication Sig Start Date End Date Taking? Authorizing Provider  ?aspirin-acetaminophen-caffeine (EXCEDRIN MIGRAINE) 250-250-65 MG tablet Take 2 tablets by mouth every 6 (six) hours as needed for headache.    [provider]  ?furosemide (LASIX) 40 MG tablet Take 40 mg twice a day 10/29/21   Minus Breeding, MD  ?lisinopril (ZESTRIL) 20 MG tablet Take 1 tablet (20 mg total) by mouth 2 (two) times daily. 10/30/21   Richardson Dopp T, PA-C  ?metoprolol succinate (TOPROL-XL) 50 MG 24 hr tablet TAKE 1 TABLET BY MOUTH TWICE DAILY **NEED  APPOINTMENT** 07/02/21   Jaquavion Mccannon, [name], MD  ?potassium chloride SA (KLOR-CON M) 20 MEQ tablet Take 1 tablet (20 mEq total) by mouth daily. 10/29/21   Minus Breeding, MD  ? ? ?Past Medical History: ?Past Medical History:   ?Diagnosis Date  ? Cardiomyopathy   ? CHF (congestive heart failure) ([place])   ? HTN (hypertension)   ? Mitral regurgitation   ? ? ?Past Surgical History: ?No past surgical history on file. ? ?Family History:  ?Family History  ?Problem Relation Age of Onset  ? Heart disease Mother   ?     No details  ? Hypertension Mother   ? Diabetes Mother   ? Hypertension Father   ? Heart murmur Sister   ? ? ?Social History: ?Social History  ? ?Socioeconomic History  ? Marital status: Single  ?  Spouse name: Not on file  ? Number of children: Not on file  ? Years of education: Not on file  ? Highest education level: Not on file  ?Occupational History  ? Not on file  ?Tobacco Use  ? Smoking status: Former  ? Smokeless tobacco: Never  ?Substance and Sexual Activity  ? Alcohol use: Yes  ?  Alcohol/week: 4.0 standard drinks  ?  Types: 3 Glasses of wine, 1 Standard drinks or equivalent per week  ? Drug use: Never  ? Sexual activity: Not on file  ?Other Topics Concern  ? Not on file  ?Social History Narrative  ? She has 4 children ages 3 and under.  She stays at home. She does not smoke cigarettes or drink alcohol. Does not get regular exercise.   ? ?Social Determinants of Health  ? ?Financial Resource Strain: Not on file  ?Food Insecurity: Not on file  ?Transportation Needs: Unmet Transportation Needs  ? Lack of Transportation (Medical): Yes  ? Lack of Transportation (Non-Medical): Yes  ?Physical Activity: Not on file  ?Stress: Not on file  ?Social Connections: Not on file  ? ? ?Allergies:  ?Allergies  ?Allergen Reactions  ? Celery Oil Anaphylaxis  ? Shellfish Allergy Anaphylaxis  ? Entresto [Sacubitril-Valsartan] Other (See Comments)  ?  GI upset  ? Hydrocodone   ? ? ?Objective:   ? ?Vital Signs:   ?Pulse Rate:  [121] 121 (05/12 0924) ?BP: (126)/(98) 126/98 (05/12 [id number]) ?Weight:  [105.7 kg] 105.7 kg (05/12 0924) ?  ?There were no vitals filed for this visit. ? ? ?Physical Exam   ?  ?General:  Sitting on the side of the bed.  No  respiratory difficulty ?HEENT: Normal ?Neck: Supple. JVP to jaw . Carotids 2+ bilat; no bruits. No lymphadenopathy or thyromegaly appreciated. ?Cor: PMI nondisplaced. Tachy Regular rate & rhythm. No rubs or murmurs. +S3  ?Lungs: Clear ?Abdomen: obese,  nontender, nondistended. No hepatosplenomegaly. No bruits or masses. Good bowel sounds. ?Extremities: No cyanosis, clubbing, rash, R and LLE 1+ edema ?Neuro: Alert & oriented x 3, cranial nerves grossly intact.  moves all 4 extremities w/o difficulty. Affect pleasant. ? ? ?Telemetry  ? ?ST ? ?EKG  ? ?ST 117 bpm  ? ?Labs  ?  ? ?Basic Metabolic Panel: ?No results for input(s): NA, K, CL, CO2, GLUCOSE, BUN, CREATININE, CALCIUM, MG, PHOS in the last 168 hours. ? ?Liver Function Tests: ?No results for input(s): AST, ALT, ALKPHOS, BILITOT, PROT, ALBUMIN in the last 168 hours. ?No results for input(s): LIPASE, AMYLASE in the last 168 hours. ?No results for input(s): AMMONIA in the last 168 hours. ? ?CBC: ?No results for input(s): WBC, NEUTROABS, HGB, HCT, MCV, PLT in the last 168 hours. ? ?Cardiac Enzymes: ?No results for input(s): CKTOTAL, CKMB, CKMBINDEX, TROPONINI in the last 168 hours. ? ?BNP: ?BNP (last 3 results) ?Recent Labs  ?  03/09/21 ?1005 10/29/21 ?0907  ?BNP 501.9* 358.5*  ? ? ?ProBNP (last 3 results) ?No results for input(s): PROBNP in the last 8760 hours. ? ? ?CBG: ?No results for input(s): GLUCAP in the last 168 hours. ? ?Coagulation Studies: ?No results for input(s): LABPROT, INR in the last 72 hours. ? ?Imaging: ?[place] EKG SITE RITE ? ?Result Date: 11/05/2021 ?If [employer] not attached, placement could not be confirmed due to current cardiac rhythm.  ? ? ?Patient Profile  ? ? ?Assessment/Plan  ? ?1.A/C HFrEF ?Onset in 2010. 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 No cath or cMRI yet ?- Echo 5/19 EF 20-25% with severe posterior MR.   ?- ECHO 12/2018 EF 35-40% mod-severe MR with restricted posterior leaflet  ?- Echo 2/21 EF 30-35% mod-severe MR RV ok  ?- Echo 04/02/20 EF  35-40% moderate to severe MR  ?- Echo  9/22 LVEF 30-35%, RV mildly reduced, Severe MR, Mild TR  ?- Admitted today with NYHA IV. Decompensation in the setting of medication noncompliance. Concerned she may have low output

## 2021-11-05 NOTE — Progress Notes (Signed)
Carly, RN made aware that PICC will not be placed today. Will continue to follow. ?

## 2021-11-05 NOTE — Telephone Encounter (Signed)
Pt contacted this Clinical research associate, had been advised to go to the hospital during appt w/ Dr. Antoine Poche this morning. Apparently was told to go home until bed available and bed now available. Pt relies on [facility] to get to appointments. There is an urgent transport number I have provided to pt. If that does not work then we can coordinate taxi to admit pt.  ? ?Octavio Graves, MSW, LCSW ?Clinical Social Worker II ?[place] Heart/Vascular Care Navigation  ?[phone]- work cell phone (preferred) ?[phone]- desk phone ? ?

## 2021-11-05 NOTE — Plan of Care (Signed)
°  Problem: Clinical Measurements: °Goal: Diagnostic test results will improve °Outcome: Progressing °Goal: Respiratory complications will improve °Outcome: Progressing °Goal: Cardiovascular complication will be avoided °Outcome: Progressing °  °Problem: Activity: °Goal: Risk for activity intolerance will decrease °Outcome: Progressing °  °Problem: Coping: °Goal: Level of anxiety will decrease °Outcome: Progressing °  °Problem: Pain Managment: °Goal: General experience of comfort will improve °Outcome: Progressing °  °

## 2021-11-05 NOTE — Progress Notes (Signed)
Unable to draw all of patients initial labs via [facility] PIV. Phlebotomy team called to draw labs.  ?

## 2021-11-05 NOTE — Progress Notes (Signed)
Heart Failure Navigator Progress Note ? ?Assessed for Heart & Vascular TOC clinic readiness.  ?Patient does not meet criteria due to she is a patient with Dr. Gala Romney. .  ? ? ? ?Rhae Hammock, BSN, RN ?Heart Failure Nurse Navigator ?Secure Chat Only   ?

## 2021-11-05 NOTE — Telephone Encounter (Signed)
Pt unable to utilize [facility] to get to hospital. Arranged a taxi through [place] [facility] [address] [facility]. Pt aware, confirmed she was picked up for admit. I called ahead to 3E nursing station and shared she was on her way.  ? ?Octavio Graves, MSW, LCSW ?Clinical Social Worker II ?[place] Heart/Vascular Care Navigation  ?[phone]- work cell phone (preferred) ?[phone]- desk phone ? ?

## 2021-11-06 ENCOUNTER — Inpatient Hospital Stay (HOSPITAL_COMMUNITY): Payer: Medicaid Other

## 2021-11-06 DIAGNOSIS — I5023 Acute on chronic systolic (congestive) heart failure: Secondary | ICD-10-CM | POA: Diagnosis not present

## 2021-11-06 DIAGNOSIS — R079 Chest pain, unspecified: Secondary | ICD-10-CM | POA: Diagnosis not present

## 2021-11-06 LAB — ECHOCARDIOGRAM COMPLETE
AR max vel: 2.15 cm2
AV Area VTI: 2.34 cm2
AV Area mean vel: 1.87 cm2
AV Mean grad: 2.4 mmHg
AV Peak grad: 4.3 mmHg
Ao pk vel: 1.04 m/s
Area-P 1/2: 3.03 cm2
Calc EF: 25.3 %
Height: 67 in
MV M vel: 4.57 m/s
MV Peak grad: 83.5 mmHg
Radius: 0.6 cm
S' Lateral: 6.5 cm
Single Plane A2C EF: 29.4 %
Single Plane A4C EF: 21.9 %
Weight: 3604.8 oz

## 2021-11-06 LAB — COOXEMETRY PANEL
Carboxyhemoglobin: 1.5 % (ref 0.5–1.5)
Methemoglobin: <0.7 % (ref 0.0–1.5)
O2 Saturation: 60.1 %
Total hemoglobin: 14.3 g/dL (ref 12.0–16.0)

## 2021-11-06 LAB — BASIC METABOLIC PANEL
Anion gap: 7 (ref 5–15)
BUN: 11 mg/dL (ref 6–20)
CO2: 24 mmol/L (ref 22–32)
Calcium: 9 mg/dL (ref 8.9–10.3)
Chloride: 106 mmol/L (ref 98–111)
Creatinine, Ser: 0.8 mg/dL (ref 0.44–1.00)
GFR, Estimated: >60 mL/min (ref >60)
Glucose, Bld: 101 mg/dL — ABNORMAL HIGH (ref 70–99)
Potassium: 3.3 mmol/L — ABNORMAL LOW (ref 3.5–5.1)
Sodium: 137 mmol/L (ref 135–145)

## 2021-11-06 LAB — T4, FREE: Free T4: 2.4 ng/dL — ABNORMAL HIGH (ref 0.61–1.12)

## 2021-11-06 LAB — TSH: TSH: <0.01 u[IU]/mL — ABNORMAL LOW (ref 0.350–4.500)

## 2021-11-06 MED ORDER — METHIMAZOLE 10 MG PO TABS
20.0000 mg | ORAL_TABLET | Freq: Every day | ORAL | Status: DC
  Administered 2021-11-06 – 2021-11-09 (×4): 20 mg via ORAL
  Filled 2021-11-06 (×4): qty 2

## 2021-11-06 MED ORDER — CHLORHEXIDINE GLUCONATE CLOTH 2 % EX PADS
6.0000 | MEDICATED_PAD | Freq: Every day | CUTANEOUS | Status: DC
  Administered 2021-11-06 – 2021-11-08 (×3): 6 via TOPICAL

## 2021-11-06 MED ORDER — SODIUM CHLORIDE 0.9% FLUSH: 10.0000 mL | INTRAVENOUS | Status: DC | PRN

## 2021-11-06 MED ORDER — CARVEDILOL 3.125 MG PO TABS
3.1250 mg | ORAL_TABLET | Freq: Two times a day (BID) | ORAL | Status: DC
  Administered 2021-11-06 – 2021-11-09 (×6): 3.125 mg via ORAL
  Filled 2021-11-06 (×6): qty 1

## 2021-11-06 MED ORDER — PREDNISONE 20 MG PO TABS
20.0000 mg | ORAL_TABLET | Freq: Every day | ORAL | Status: DC
  Administered 2021-11-07 – 2021-11-09 (×3): 20 mg via ORAL
  Filled 2021-11-06 (×3): qty 1

## 2021-11-06 MED ORDER — SODIUM CHLORIDE 0.9% FLUSH
10.0000 mL | Freq: Two times a day (BID) | INTRAVENOUS | Status: DC
  Administered 2021-11-06 – 2021-11-09 (×7): 10 mL

## 2021-11-06 NOTE — Progress Notes (Addendum)
Patient ID: Sarah Reilly, female   DOB: [birth year], 41 y.o.   MRN: [id number] ?  ? ? Advanced Heart Failure Rounding Note ? ?PCP-Cardiologist: Minus Breeding, MD  ? ?Subjective:   ? ?Good diuresis overnight, I/Os net negative 3017.  Creatinine stable 0.8.  Co-ox 74%, CVP not set up yet.  ? ?HR 100s, mild tachy.  TSH < 0.010.  ? ? ?Objective:   ?Weight Range: ?102.2 kg ?Body mass index is 35.29 kg/m?.  ? ?Vital Signs:   ?Temp:  [98.1 ?F (36.7 ?C)-98.4 ?F (36.9 ?C)] 98.1 ?F (36.7 ?C) (05/13 0456) ?Pulse Rate:  [102-107] 107 (05/13 0938) ?Resp:  [20] 20 (05/13 0456) ?BP: (112-133)/(63-93) 117/90 (05/13 0456) ?SpO2:  [98 %-99 %] 98 % (05/13 0456) ?Weight:  [102.2 kg-104.2 kg] 102.2 kg (05/13 0025) ?Last BM Date : 11/05/21 ? ?Weight change: ?Filed Weights  ? 11/05/21 1800 11/06/21 0025  ?Weight: 104.2 kg 102.2 kg  ? ? ?Intake/Output:  ? ?Intake/Output Summary (Last 24 hours) at 11/06/2021 1003 ?Last data filed at 11/06/2021 0459 ?Gross per 24 hour  ?Intake 483 ml  ?Output 3500 ml  ?Net -3017 ml  ?  ? ? ?Physical Exam  ?  ?General:  Well appearing. No resp difficulty ?HEENT: Normal ?Neck: Supple. JVP 12-14 cm. Carotids 2+ bilat; no bruits. Diffuse thyromegaly.  ?Cor: PMI nondisplaced. Mildly tachy, regular rate & rhythm. +S3. 1/6 HSM apex.  ?Lungs: Clear ?Abdomen: Soft, nontender, nondistended. No hepatosplenomegaly. No bruits or masses. Good bowel sounds. ?Extremities: No cyanosis, clubbing, rash, 1+ ankle edema.  ?Neuro: Alert & orientedx3, cranial nerves grossly intact. moves all 4 extremities w/o difficulty. Affect pleasant ? ? ?Telemetry  ? ?Sinus tachy 100s (personally reviewed) ? ?Labs  ?  ?CBC ?No results for input(s): WBC, NEUTROABS, HGB, HCT, MCV, PLT in the last 72 hours. ?Basic Metabolic Panel ?Recent Labs  ?  11/06/21 ?[id number]  ?NA 137  ?K 3.3*  ?CL 106  ?CO2 24  ?GLUCOSE 101*  ?BUN 11  ?CREATININE 0.80  ?CALCIUM 9.0  ? ?Liver Function Tests ?No results for input(s): AST, ALT, ALKPHOS, BILITOT, PROT, ALBUMIN  in the last 72 hours. ?No results for input(s): LIPASE, AMYLASE in the last 72 hours. ?Cardiac Enzymes ?No results for input(s): CKTOTAL, CKMB, CKMBINDEX, TROPONINI in the last 72 hours. ? ?BNP: ?BNP (last 3 results) ?Recent Labs  ?  03/09/21 ?1005 10/29/21 ?0907 11/05/21 ?2310  ?BNP 501.9* 358.5* 509.5*  ? ? ?ProBNP (last 3 results) ?No results for input(s): PROBNP in the last 8760 hours. ? ? ?D-Dimer ?No results for input(s): DDIMER in the last 72 hours. ?Hemoglobin A1C ?No results for input(s): HGBA1C in the last 72 hours. ?Fasting Lipid Panel ?No results for input(s): CHOL, HDL, LDLCALC, TRIG, CHOLHDL, LDLDIRECT in the last 72 hours. ?Thyroid Function Tests ?Recent Labs  ?  11/05/21 ?2310  ?TSH <0.010*  ? ? ?Other results: ? ? ?Imaging  ? ? ?DG Chest Port 1 View ? ?Result Date: 11/06/2021 ?CLINICAL DATA:  Chest pain. EXAM: PORTABLE CHEST 1 VIEW COMPARISON:  10/29/2021 FINDINGS: 0448 hours. The cardio pericardial silhouette is enlarged. There is some minimal streaky opacity in the bases, likely atelectasis or scarring. No pulmonary edema or substantial pleural effusion. Telemetry leads overlie the chest. IMPRESSION: Cardiomegaly with streaky opacity in the lung bases likely atelectasis. 1 cm nodular density projecting at the left lung base previously is not visible today. Electronically Signed   By: Misty Stanley M.D.   On: 11/06/2021 08:40  ? ?[place] EKG  SITE RITE ? ?Result Date: 11/05/2021 ?If [employer] not attached, placement could not be confirmed due to current cardiac rhythm.  ? ? ?Medications:   ? ? ?Scheduled Medications: ? Chlorhexidine Gluconate Cloth  6 each Topical Daily  ? digoxin  0.125 mg Oral Daily  ? enoxaparin (LOVENOX) injection  40 mg Subcutaneous Q24H  ? furosemide  80 mg Intravenous BID  ? methimazole  20 mg Oral Daily  ? potassium chloride  40 mEq Oral BID  ? [START ON 11/07/2021] predniSONE  20 mg Oral Q breakfast  ? sodium chloride flush  10-40 mL Intracatheter Q12H  ? sodium chloride  flush  3 mL Intravenous Q12H  ? spironolactone  25 mg Oral Daily  ? ? ?Infusions: ? sodium chloride    ? ? ?PRN Medications: ?sodium chloride, acetaminophen, ondansetron (ZOFRAN) IV, sodium chloride flush, sodium chloride flush ? ? ? ?Assessment/Plan  ? ?1.A/C HFrEF ?Onset in 2010. 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 ?- Echo 5/19 EF 20-25% with severe posterior MR.   ?- ECHO 12/2018 EF 35-40% mod-severe MR with restricted posterior leaflet  ?- Echo 2/21 EF 30-35% mod-severe MR RV ok  ?- Echo 04/02/20 EF 35-40% moderate to severe MR  ?- Echo  9/22 LVEF 30-35%, RV mildly reduced, Severe MR, Mild TR  ?- Admitted with NYHA IV symptoms. Decompensation in the setting of medication noncompliance, taking only Lasix and lisinopril at home, not on thyroid meds.   ?- Co-ox ok 74% today.  Setting up CVP.   ?- Volume status elevated on exam, good diuresis yesterday. Continue IV lasix 80 mg twice a day  ?- Continue spironolactone 25 mg daily.  ?- Continue digoxin.  ?- Would add low dose Coreg 3.125 mg bid with tachycardia/hyperthyroidism and stable co-ox.   ?- Echo this admission.  ?- Consider cMRI this admission when diuresed.  ?- Noncompliant with treatment, not candidate for advanced therapies.  ?  ?2. 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   ?- She was [facility] from [facility]  ?- TSH <0.010, send free T3 and free T4.  ?- Start methimazole 20 mg daily.  ?- Start prednisone 20 mg daily.  ?- Start low dose beta blocker.  ?  ?3. Severe MR  ?- Repeat ECHO  ?  ?4. Obesity  ?  ? ?Length of Stay: 1 ? ?Loralie Champagne, MD  ?11/06/2021, 10:03 AM ? ?Advanced Heart Failure Team ?Pager [phone] (M-F; 7a - 5p)  ?Please contact [facility] for night-coverage after hours (5p -7a ) and weekends on [email or web address] ?  ?

## 2021-11-06 NOTE — Progress Notes (Signed)
*  PRELIMINARY RESULTS* ?Echocardiogram ?2D Echocardiogram has been performed. ? ?Stacey Drain ?11/06/2021, 4:54 PM ?

## 2021-11-06 NOTE — Progress Notes (Signed)
Peripherally Inserted Central Catheter Placement ? ?The IV Nurse has discussed with the patient and/or persons authorized to consent for the patient, the purpose of this procedure and the potential benefits and risks involved with this procedure.  The benefits include less needle sticks, lab draws from the catheter, and the patient may be discharged home with the catheter. Risks include, but not limited to, infection, bleeding, blood clot (thrombus formation), and puncture of an artery; nerve damage and irregular heartbeat and possibility to perform a PICC exchange if needed/ordered by physician.  Alternatives to this procedure were also discussed.  Bard Power PICC patient education guide, fact sheet on infection prevention and patient information card has been provided to patient /or left at bedside.   ? ?PICC Placement Documentation  ?PICC Double Lumen Q000111Q Right Basilic 42 cm 1 cm (Active)  ?Indication for Insertion or Continuance of Line Chronic illness with exacerbations (CF, Sickle Cell, etc.) 11/06/21 0900  ?Exposed Catheter (cm) 1 cm 11/06/21 0900  ?Site Assessment Clean, Dry, Intact 11/06/21 0900  ?Lumen #1 Status Flushed;Saline locked;Blood return noted 11/06/21 0900  ?Lumen #2 Status Flushed;Saline locked;Blood return noted 11/06/21 0900  ?Dressing Type Transparent;Securing device 11/06/21 0900  ?Dressing Status Antimicrobial disc in place;Clean, Dry, Intact 11/06/21 0900  ?Safety Lock Not Applicable [id number] [id number]  ?Line Care Connections checked and tightened 11/06/21 0900  ?Line Adjustment (NICU/IV Team Only) No 11/06/21 0900  ?Dressing Intervention New dressing 11/06/21 0900  ?Dressing Change Due 11/13/21 11/06/21 0900  ? ? ? ? ? ?Rolena Infante ?11/06/2021, 9:05 AM ? ?

## 2021-11-06 NOTE — Progress Notes (Signed)
?  Transition of Care (TOC) Screening Note ? ? ?Patient Details  ?Name: Sarah Reilly ?Date of Birth: 04-Jun-1981 ? ? ?Transition of Care (TOC) CM/SW Contact:    ?Patrice Paradise, LCSW ?Phone Number: ?11/06/2021, 3:11 PM ? ? ? ?Transition of Care Department [facility]) has reviewed patient and acknowledges consult for SNF. The patient will require PT/OT evaluations. TOC will assist with disposition planning once the evaluations have been completed.   ? ? ?  ?  ?TOC will continue to follow.    ?

## 2021-11-07 ENCOUNTER — Inpatient Hospital Stay (HOSPITAL_COMMUNITY): Payer: Medicaid Other

## 2021-11-07 DIAGNOSIS — I5023 Acute on chronic systolic (congestive) heart failure: Secondary | ICD-10-CM | POA: Diagnosis not present

## 2021-11-07 DIAGNOSIS — R1011 Right upper quadrant pain: Secondary | ICD-10-CM | POA: Diagnosis not present

## 2021-11-07 LAB — HEPATIC FUNCTION PANEL
ALT: 32 U/L (ref 0–44)
AST: 35 U/L (ref 15–41)
Albumin: 3.6 g/dL (ref 3.5–5.0)
Alkaline Phosphatase: 51 U/L (ref 38–126)
Bilirubin, Direct: 0.6 mg/dL — ABNORMAL HIGH (ref 0.0–0.2)
Indirect Bilirubin: 1.3 mg/dL — ABNORMAL HIGH (ref 0.3–0.9)
Total Bilirubin: 1.9 mg/dL — ABNORMAL HIGH (ref 0.3–1.2)
Total Protein: 6.8 g/dL (ref 6.5–8.1)

## 2021-11-07 LAB — COOXEMETRY PANEL
Carboxyhemoglobin: 1.8 % — ABNORMAL HIGH (ref 0.5–1.5)
Methemoglobin: 0.7 % (ref 0.0–1.5)
O2 Saturation: 66.6 %
Total hemoglobin: 14 g/dL (ref 12.0–16.0)

## 2021-11-07 LAB — BASIC METABOLIC PANEL
Anion gap: 8 (ref 5–15)
BUN: 12 mg/dL (ref 6–20)
CO2: 25 mmol/L (ref 22–32)
Calcium: 9.1 mg/dL (ref 8.9–10.3)
Chloride: 103 mmol/L (ref 98–111)
Creatinine, Ser: 0.72 mg/dL (ref 0.44–1.00)
GFR, Estimated: >60 mL/min (ref >60)
Glucose, Bld: 104 mg/dL — ABNORMAL HIGH (ref 70–99)
Potassium: 3.7 mmol/L (ref 3.5–5.1)
Sodium: 136 mmol/L (ref 135–145)

## 2021-11-07 LAB — T3, FREE: T3, Free: 4.8 pg/mL — ABNORMAL HIGH (ref 2.0–4.4)

## 2021-11-07 MED ORDER — LISINOPRIL 10 MG PO TABS
10.0000 mg | ORAL_TABLET | Freq: Every day | ORAL | Status: DC
Start: 2021-11-07 — End: 2021-11-08
  Administered 2021-11-07: 10 mg via ORAL
  Filled 2021-11-07: qty 1

## 2021-11-07 NOTE — Progress Notes (Signed)
?   11/07/21 1135  ?Clinical Encounter Type  ?Visited With Patient  ?Visit Type Initial;Other (Comment) ?(Advanced Directive)  ?Referral From Nurse  ?Consult/Referral To Chaplain  ? ?Chaplain responded to a spiritual request for advanced directive education. I provided the patient, Sarah Reilly with the forms and highlighted the key points. Sarah Reilly was appreciative of the information and plans to look the paperwork over today while she is waiting for her procedure.  ?We wished one another a happy mother's day and I left her with her thoughts.  ? ?Valerie Roys ?Chaplain  ?[facility]  ?[phone] ?

## 2021-11-07 NOTE — Progress Notes (Signed)
Patient ID: Sarah Reilly, female   DOB: Sep 25, 1980, 41 y.o.   MRN: [id number] ?  ? ? Advanced Heart Failure Rounding Note ? ?PCP-Cardiologist: Minus Breeding, MD  ? ?Subjective:   ? ?Good diuresis overnight, I/Os net negative 3340.  Creatinine stable 0.72.  Co-ox 67%, CVP 15-16.  ? ?HR 100s, mild tachy.  TSH < 0.010, free T4 high. ? ?Complains of sharp transient pains RUQ, wants a CT.  Wants to go home soon, has 3 kids at home.  ? ?Echo: EF 25-30%, severe LV dilation, mildly decreased RV function, severe LAE, moderate-severe MR with thickened mitral valve, moderate TR, IVC dilated.  ? ? ?Objective:   ?Weight Range: ?100.5 kg ?Body mass index is 34.71 kg/m?.  ? ?Vital Signs:   ?Temp:  [97.8 ?F (36.6 ?C)-98 ?F (36.7 ?C)] 97.8 ?F (36.6 ?C) (05/14 0455) ?Pulse Rate:  [99-109] 107 (05/14 0807) ?Resp:  [18] 18 (05/14 0455) ?BP: (108-122)/(84-90) 108/86 (05/14 [id number]) ?SpO2:  [97 %-98 %] 98 % (05/14 0807) ?Weight:  [100.5 kg] 100.5 kg (05/14 0455) ?Last BM Date : 11/06/21 ? ?Weight change: ?Filed Weights  ? 11/05/21 1800 11/06/21 0025 11/07/21 0455  ?Weight: 104.2 kg 102.2 kg 100.5 kg  ? ? ?Intake/Output:  ? ?Intake/Output Summary (Last 24 hours) at 11/07/2021 1057 ?Last data filed at 11/07/2021 1033 ?Gross per 24 hour  ?Intake 560 ml  ?Output 7700 ml  ?Net -7140 ml  ?  ? ? ?Physical Exam  ?  ?General: NAD ?Neck: JVP 12-14 cm, + thyromegaly.  ?Lungs: Clear to auscultation bilaterally with normal respiratory effort. ?CV: Nondisplaced PMI.  Heart regular S1/S2, + S3, 2/6 HSM apex.  Trace ankle edema.  ?Abdomen: Soft, nontender, no hepatosplenomegaly, no distention.  ?Skin: Intact without lesions or rashes.  ?Neurologic: Alert and oriented x 3.  ?Psych: Normal affect. ?Extremities: No clubbing or cyanosis.  ?HEENT: Normal.  ? ? ?Telemetry  ? ?Sinus tachy 100s (personally reviewed) ? ?Labs  ?  ?CBC ?No results for input(s): WBC, NEUTROABS, HGB, HCT, MCV, PLT in the last 72 hours. ?Basic Metabolic Panel ?Recent Labs  ?   11/06/21 ?[id number] 11/07/21 ?0515  ?NA 137 136  ?K 3.3* 3.7  ?CL 106 103  ?CO2 24 25  ?GLUCOSE 101* 104*  ?BUN 11 12  ?CREATININE 0.80 0.72  ?CALCIUM 9.0 9.1  ? ?Liver Function Tests ?No results for input(s): AST, ALT, ALKPHOS, BILITOT, PROT, ALBUMIN in the last 72 hours. ?No results for input(s): LIPASE, AMYLASE in the last 72 hours. ?Cardiac Enzymes ?No results for input(s): CKTOTAL, CKMB, CKMBINDEX, TROPONINI in the last 72 hours. ? ?BNP: ?BNP (last 3 results) ?Recent Labs  ?  03/09/21 ?1005 10/29/21 ?0907 11/05/21 ?2310  ?BNP 501.9* 358.5* 509.5*  ? ? ?ProBNP (last 3 results) ?No results for input(s): PROBNP in the last 8760 hours. ? ? ?D-Dimer ?No results for input(s): DDIMER in the last 72 hours. ?Hemoglobin A1C ?No results for input(s): HGBA1C in the last 72 hours. ?Fasting Lipid Panel ?No results for input(s): CHOL, HDL, LDLCALC, TRIG, CHOLHDL, LDLDIRECT in the last 72 hours. ?Thyroid Function Tests ?Recent Labs  ?  11/05/21 ?2310  ?TSH <0.010*  ? ? ?Other results: ? ? ?Imaging  ? ? ?ECHOCARDIOGRAM COMPLETE ? ?Result Date: 11/06/2021 ?   ECHOCARDIOGRAM REPORT   Patient Name:   Sarah Reilly Date of Exam: 11/06/2021 Medical Rec #:  [id number]          Height:       67.0 in Accession #:  [id number]         Weight:       225.3 lb Date of Birth:  11-02-1980          BSA:          2.127 m? Patient Age:    66 years           BP:           117/90 mmHg Patient Gender: F                  HR:           104 bpm. Exam Location:  [facility] Procedure: 2D Echo, Cardiac Doppler and Color Doppler Indications:    I50.9 Congestive Heart Failure  History:        Patient has prior history of Echocardiogram examinations, most                 recent 03/09/2021. Cardiomyopathy, Mitral Valve Disease; Risk                 Factors:Hypertension and Former Smoker.  Sonographer:    Alvino Chapel RCS Referring Phys: [phone] AMY D CLEGG IMPRESSIONS  1. Left ventricular ejection fraction, by estimation, is 25 to 30%. The left ventricle has  severely decreased function. The left ventricle demonstrates global hypokinesis. The left ventricular internal cavity size was severely dilated. Left ventricular diastolic parameters are indeterminate.  2. Right ventricular systolic function is mildly reduced. The right ventricular size is normal. There is mildly elevated pulmonary artery systolic pressure.  3. Left atrial size was severely dilated.  4. Right atrial size was moderately dilated.  5. The pericardial effusion is circumferential.  6. Moderate to severe functional MR due to tethering of posterior leaflet in setting of systolic dysfunction. . The mitral valve is abnormal. Moderate to severe mitral valve regurgitation. No evidence of mitral stenosis.  7. The tricuspid valve is abnormal. Tricuspid valve regurgitation is moderate.  8. The aortic valve is tricuspid. Aortic valve regurgitation is not visualized. No aortic stenosis is present.  9. The inferior vena cava is dilated in size with <50% respiratory variability, suggesting right atrial pressure of 15 mmHg. FINDINGS  Left Ventricle: Left ventricular ejection fraction, by estimation, is 25 to 30%. The left ventricle has severely decreased function. The left ventricle demonstrates global hypokinesis. The left ventricular internal cavity size was severely dilated. There is no left ventricular hypertrophy. Left ventricular diastolic parameters are indeterminate. Right Ventricle: The right ventricular size is normal. Right vetricular wall thickness was not well visualized. Right ventricular systolic function is mildly reduced. There is mildly elevated pulmonary artery systolic pressure. The tricuspid regurgitant velocity is 2.42 m/s, and with an assumed right atrial pressure of 15 mmHg, the estimated right ventricular systolic pressure is [id number] mmHg. Left Atrium: Left atrial size was severely dilated. Right Atrium: Right atrial size was moderately dilated. Pericardium: Trivial pericardial effusion is  present. The pericardial effusion is circumferential. Mitral Valve: Moderate to severe functional MR due to tethering of posterior leaflet in setting of systolic dysfunction. The mitral valve is abnormal. There is mild thickening of the mitral valve leaflet(s). Moderate to severe mitral valve regurgitation.  No evidence of mitral valve stenosis. Tricuspid Valve: The tricuspid valve is abnormal. Tricuspid valve regurgitation is moderate . No evidence of tricuspid stenosis. Aortic Valve: The aortic valve is tricuspid. Aortic valve regurgitation is not visualized. No aortic stenosis is present. Aortic valve mean gradient measures 2.4 mmHg. Aortic valve peak  gradient measures 4.3 mmHg. Aortic valve area, by VTI measures 2.34 cm?. Pulmonic Valve: The pulmonic valve was not well visualized. Pulmonic valve regurgitation is mild. No evidence of pulmonic stenosis. Aorta: The aortic root is normal in size and structure. Venous: The inferior vena cava is dilated in size with less than 50% respiratory variability, suggesting right atrial pressure of 15 mmHg. IAS/Shunts: No atrial level shunt detected by color flow Doppler.  LEFT VENTRICLE PLAX 2D LVIDd:         7.40 cm      Diastology LVIDs:         6.50 cm      LV e' medial:    8.92 cm/s LV PW:         1.00 cm      LV E/e' medial:  20.0 LV IVS:        0.80 cm      LV e' lateral:   12.10 cm/s LVOT diam:     2.10 cm      LV E/e' lateral: 14.7 LV SV:         33 LV SV Index:   16 LVOT Area:     3.46 cm?  LV Volumes (MOD) LV vol d, MOD A2C: 231.0 ml LV vol d, MOD A4C: 233.0 ml LV vol s, MOD A2C: 163.0 ml LV vol s, MOD A4C: 182.0 ml LV SV MOD A2C:     68.0 ml LV SV MOD A4C:     233.0 ml LV SV MOD BP:      59.1 ml RIGHT VENTRICLE RV S prime:     9.46 cm/s TAPSE (M-mode): 1.9 cm LEFT ATRIUM              Index        RIGHT ATRIUM           Index LA diam:        5.50 cm  2.59 cm/m?   RA Area:     25.10 cm? LA Vol (A2C):   169.0 ml 79.45 ml/m?  RA Volume:   72.70 ml  34.18 ml/m? LA Vol  (A4C):   141.0 ml 66.28 ml/m? LA Biplane Vol: 153.0 ml 71.92 ml/m?  AORTIC VALVE AV Area (Vmax):    2.15 cm? AV Area (Vmean):   1.87 cm? AV Area (VTI):     2.34 cm? AV Vmax:           104.18 cm/s AV Vmean:          7

## 2021-11-07 NOTE — Plan of Care (Signed)

## 2021-11-08 ENCOUNTER — Inpatient Hospital Stay (HOSPITAL_COMMUNITY): Payer: Medicaid Other

## 2021-11-08 DIAGNOSIS — I5023 Acute on chronic systolic (congestive) heart failure: Secondary | ICD-10-CM | POA: Diagnosis not present

## 2021-11-08 LAB — BASIC METABOLIC PANEL
Anion gap: 8 (ref 5–15)
BUN: 13 mg/dL (ref 6–20)
CO2: 25 mmol/L (ref 22–32)
Calcium: 9.5 mg/dL (ref 8.9–10.3)
Chloride: 103 mmol/L (ref 98–111)
Creatinine, Ser: 0.79 mg/dL (ref 0.44–1.00)
GFR, Estimated: >60 mL/min (ref >60)
Glucose, Bld: 124 mg/dL — ABNORMAL HIGH (ref 70–99)
Potassium: 3.9 mmol/L (ref 3.5–5.1)
Sodium: 136 mmol/L (ref 135–145)

## 2021-11-08 LAB — COOXEMETRY PANEL
Carboxyhemoglobin: 1.4 % (ref 0.5–1.5)
Methemoglobin: <0.7 % (ref 0.0–1.5)
O2 Saturation: 58.3 %
Total hemoglobin: 14.8 g/dL (ref 12.0–16.0)

## 2021-11-08 MED ORDER — GADOBUTROL 1 MMOL/ML IV SOLN
10.0000 mL | Freq: Once | INTRAVENOUS | Status: AC | PRN
  Administered 2021-11-08: 10 mL via INTRAVENOUS

## 2021-11-08 MED ORDER — TORSEMIDE 20 MG PO TABS
40.0000 mg | ORAL_TABLET | Freq: Every day | ORAL | Status: DC
  Administered 2021-11-09: 40 mg via ORAL
  Filled 2021-11-08 (×2): qty 2

## 2021-11-08 MED ORDER — LISINOPRIL 20 MG PO TABS
20.0000 mg | ORAL_TABLET | Freq: Every day | ORAL | Status: DC
Start: 2021-11-08 — End: 2021-11-09
  Administered 2021-11-08 – 2021-11-09 (×2): 20 mg via ORAL
  Filled 2021-11-08 (×2): qty 1

## 2021-11-08 NOTE — Progress Notes (Addendum)
Patient ID: Sarah Reilly, female   DOB: 08-22-1980, 41 y.o.   MRN: [id number] ?  ? ? Advanced Heart Failure Rounding Note ? ?PCP-Cardiologist: Rollene Rotunda, MD  ? ?Subjective:   ? ?CO-OX 58%. ? ?CVP 7-8. ? ?Good diuresis again yesterday with IV lasix. Weight down another 4 lb.  ? ?BP stable.  ? ?Feeling better. No dyspnea, orthopnea or PND. Had RUQ pain intermittently yesterday, no recurrence so far this am. ? ? ?Echo: EF 25-30%, severe LV dilation, mildly decreased RV function, severe LAE, moderate-severe MR with thickened mitral valve, moderate TR, IVC dilated.  ? ? ?Objective:   ?Weight Range: ?98.5 kg ?Body mass index is 34.02 kg/m?.  ? ?Vital Signs:   ?Temp:  [98 ?F (36.7 ?C)] 98 ?F (36.7 ?C) (05/15 0437) ?Pulse Rate:  [104-112] 107 (05/15 0437) ?Resp:  [18-19] 18 (05/15 0437) ?BP: (108-132)/(82-106) 125/82 (05/15 0437) ?SpO2:  [97 %-100 %] 100 % (05/15 0437) ?Weight:  [98.5 kg] 98.5 kg (05/15 0437) ?Last BM Date : 11/06/21 ? ?Weight change: ?Filed Weights  ? 11/06/21 0025 11/07/21 0455 11/08/21 0437  ?Weight: 102.2 kg 100.5 kg 98.5 kg  ? ? ?Intake/Output:  ? ?Intake/Output Summary (Last 24 hours) at 11/08/2021 0702 ?Last data filed at 11/08/2021 0051 ?Gross per 24 hour  ?Intake 177 ml  ?Output 4700 ml  ?Net -4523 ml  ?  ? ? ?Physical Exam  ?  ?General: No distress. Sitting up in bed. ?HEENT: normal ?Neck: supple. JVP 8-10. + thyromegaly ?Cor: PMI nondisplaced. Regular rate & rhythm. No rubs, gallops, 2/6 HSM at apex ?Lungs: clear ?Abdomen: soft, nontender, nondistended.  ?Extremities: no cyanosis, clubbing, rash, edema, + LUE PICC ?Neuro: alert & orientedx3, cranial nerves grossly intact. moves all 4 extremities w/o difficulty. Affect pleasant ? ? ? ?Telemetry  ? ?Sinus tach, 100s ? ?Labs  ?  ?CBC ?No results for input(s): WBC, NEUTROABS, HGB, HCT, MCV, PLT in the last 72 hours. ?Basic Metabolic Panel ?Recent Labs  ?  11/07/21 ?9326 11/08/21 ?0500  ?NA 136 136  ?K 3.7 3.9  ?CL 103 103  ?CO2 25 25  ?GLUCOSE  104* 124*  ?BUN 12 13  ?CREATININE 0.72 0.79  ?CALCIUM 9.1 9.5  ? ?Liver Function Tests ?Recent Labs  ?  11/07/21 ?1201  ?AST 35  ?ALT 32  ?ALKPHOS 51  ?BILITOT 1.9*  ?PROT 6.8  ?ALBUMIN 3.6  ? ?No results for input(s): LIPASE, AMYLASE in the last 72 hours. ?Cardiac Enzymes ?No results for input(s): CKTOTAL, CKMB, CKMBINDEX, TROPONINI in the last 72 hours. ? ?BNP: ?BNP (last 3 results) ?Recent Labs  ?  03/09/21 ?1005 10/29/21 ?0907 11/05/21 ?2310  ?BNP 501.9* 358.5* 509.5*  ? ? ?ProBNP (last 3 results) ?No results for input(s): PROBNP in the last 8760 hours. ? ? ?D-Dimer ?No results for input(s): DDIMER in the last 72 hours. ?Hemoglobin A1C ?No results for input(s): HGBA1C in the last 72 hours. ?Fasting Lipid Panel ?No results for input(s): CHOL, HDL, LDLCALC, TRIG, CHOLHDL, LDLDIRECT in the last 72 hours. ?Thyroid Function Tests ?Recent Labs  ?  11/05/21 ?2310 11/06/21 ?1120  ?TSH <0.010*  --   ?T3FREE  --  4.8*  ? ? ?Other results: ? ? ?Imaging  ? ? ?US Abdomen Limited RUQ (LIVER/GB) ? ?Result Date: 11/07/2021 ?CLINICAL DATA:  Right upper quadrant pain EXAM: ULTRASOUND ABDOMEN LIMITED RIGHT UPPER QUADRANT COMPARISON:  None Available. FINDINGS: Gallbladder: No gallstones or wall thickening visualized. No sonographic Murphy sign noted by sonographer. Common bile duct: Diameter:  3 mm Liver: No focal lesion identified. Within normal limits in parenchymal echogenicity. Portal vein is patent on color Doppler imaging with normal direction of blood flow towards the liver. Other: None. IMPRESSION: Unremarkable right upper quadrant sonogram. Electronically Signed   By: Jannifer Hick M.D.   On: 11/07/2021 17:14   ? ? ?Medications:   ? ? ?Scheduled Medications: ? carvedilol  3.125 mg Oral BID WC  ? Chlorhexidine Gluconate Cloth  6 each Topical Daily  ? digoxin  0.125 mg Oral Daily  ? enoxaparin (LOVENOX) injection  40 mg Subcutaneous Q24H  ? furosemide  80 mg Intravenous BID  ? lisinopril  10 mg Oral Daily  ? methimazole   20 mg Oral Daily  ? potassium chloride  40 mEq Oral BID  ? predniSONE  20 mg Oral Q breakfast  ? sodium chloride flush  10-40 mL Intracatheter Q12H  ? sodium chloride flush  3 mL Intravenous Q12H  ? spironolactone  25 mg Oral Daily  ? ? ?Infusions: ? sodium chloride    ? ? ?PRN Medications: ?sodium chloride, acetaminophen, ondansetron (ZOFRAN) IV, sodium chloride flush, sodium chloride flush ? ? ? ?Assessment/Plan  ? ?1.A/C HFrEF ?Onset in 2010. 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 ?- Echo 5/19 EF 20-25% with severe posterior MR.   ?- ECHO 12/2018 EF 35-40% mod-severe MR with restricted posterior leaflet  ?- Echo 2/21 EF 30-35% mod-severe MR RV ok  ?- Echo 04/02/20 EF 35-40% moderate to severe MR  ?- Echo  9/22 LVEF 30-35%, RV mildly reduced, Severe MR, Mild TR  ?- Echo this admission: EF 25-30%, severe LV dilation, mildly decreased RV function, severe LAE, moderate-severe MR with thickened mitral valve, moderate TR, IVC dilated.  ?- Admitted with NYHA IV symptoms. Decompensation in the setting of medication noncompliance, taking only Lasix and lisinopril at home, not on thyroid meds.   ?-CO-OX 58% ?- CVP 7-8.  Good diuresis with IV lasix 80 mg BID. Will give one more dose IV lasix then transition to po Torsemide 40 mg daily (on lasix 40 BID PTA) ?- Considered adding [place]. However, previously avoided SGLT2i d/t multiple episodes of pyelonephritis in past. Denies recent GU symptoms. ?- Continue spironolactone 25 mg daily.  ?- Continue digoxin.  ?- Continue Coreg 3.125 mg bid with tachycardia/hyperthyroidism and stable co-ox.   ?- Says she cannot take Entresto due to GI upset. Tolerating Lisinopril, increase to 20 mg daily. ?- Check cMRI ?- Noncompliant with treatment, not candidate for advanced therapies.  ?  ?2. Hyperthyroidism  ?- Previously on methimazole and followed by Dr Lucianne Muss  ?- Previously discussed possible I-131 radiation for Grave's previously but she never  followed uup.   ?- She was dismissed from [facility].  She will need appointment with new endocrinologist.  ?- TSH <0.010, free T4 high.  ?- Started methimazole 20 mg daily.  ?- Started prednisone 20 mg daily, can taper down over time.  ?- Started low dose beta blocker.  ?  ?3. Mitral regurgitation: Moderate-severe on echo this admission.  With severe LV and LA dilation, suspect component of functional MR but the mitral valve is also thickened towards the tips.  She is not inclined to invasive management/evaluation, hopefully MR will improve with medical management.  ?  ?4. RUQ pain: Sharp, transient but repetitive.   ?- RUQ US unremarkable. ?- Hepatic transaminases okay, direct and indirect bilirubin elevated ?- May need additional GI workup ? ? ?Anticipate will be ready for  discharge tomorrow.  ? ?CR consult ?  ? ?Length of Stay: 3 ? ?FINCH, LINDSAY N, PA-C  ?11/08/2021, 7:02 AM ? ?Advanced Heart Failure Team ?Pager [phone] (M-F; 7a - 5p)  ?Please contact [facility] for night-coverage after hours (5p -7a ) and weekends on [email or web address] ? ?Patient seen and examined with the above-signed Advanced Practice Provider and/or Housestaff. I personally reviewed laboratory data, imaging studies and relevant notes. I independently examined the patient and formulated the important aspects of the plan. I have edited the note to reflect any of my changes or salient points. I have personally discussed the plan with the patient and/or family. ? ?Volume status much improved. Denies orthopnea or PND. Now back on hyperthyroid regimen.  Co-ox ok  ? ?General:  Well appearing. No resp difficulty ?HEENT: normal ?Neck: supple. JVP 7-8. Carotids 2+ bilat; no bruits. No lymphadenopathy appreciated. + goiter  ?Cor: PMI nondisplaced. Regular rate & rhythm. No rubs, gallops or murmurs. ?Lungs: clear ?Abdomen: soft, nontender, nondistended. No hepatosplenomegaly. No bruits or masses. Good bowel sounds. ?Extremities: no cyanosis,  clubbing, rash, edema ?Neuro: alert & orientedx3, cranial nerves grossly intact. moves all 4 extremities w/o difficulty. Affect pleasant ? ?Continue diuresis today. Plan cMRI. Home in am  ? ?Norlene Duel

## 2021-11-08 NOTE — Progress Notes (Signed)
CARDIAC REHAB PHASE I  ? ?PRE:  Rate/Rhythm: 108 ST ? ?  BP: sitting 120/91 ? ?  SaO2: 95 RA ? ?MODE:  Ambulation: 470 ft  ? ?POST:  Rate/Rhythm: 124 ST ? ?  BP: sitting 127/107  ? ?  SaO2: 100 RA ? ?Pt ambulated without c/o SOB, no rest. She did c/o leg weakness with distance. HR to 124 ST. Left HF booklet however transport came for MRI so no discussion.  ?[id number]  ? ?Yves Dill CES, ACSM ?11/08/2021 ?2:28 PM ? ? ? ? ?

## 2021-11-08 NOTE — Discharge Summary (Addendum)
Advanced Heart Failure Team ? ?Discharge Summary  ? ?Patient ID: Sarah Reilly ?MRN: [id number], DOB/AGE: Dec 31, 1980 41 y.o. Admit date: 11/05/2021 ?D/C date:     11/09/2021  ? ?Primary Discharge Diagnoses:  ?Acute on chronic heart failure with reduced EF ?Hyperthyroidism ?Mitral regurgitation ?RUQ pain ? ? ?Hospital Course:  ?Sarah Reilly is a 41 y.o. 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  ?  ?She developed HF after the birth of her 62th child in 2010. She was in [place] at the time. Saw Dr. Percival Spanish EF 20%. Did not have a heart cath.  She then moved back to [place] to be by her mother.  She was followed at [place] by Dr. Sheliah Mends.  She had a high-risk pregnancy and had her 5th Child in 82. Apparently EF dropped to 15-20% for a period. They suggested ICD but she refused. She returned to [place]. ?  ?In July 2019 she was diagnosed with hyperthyroidism. Started on methimazole bu Dr Dwyane Dee.  ?  ?Previously she was having CP in setting of 30 pound weight gain. Dr. Haroldine Laws suggested R/L cath but she refused saying she wanted to try and lose weight first and see how she felt. We eventually rescheduled but she couldn't get COVID test.  ?  ?She was seen in [facility] 8/21 and was quite tachycardic. She had not seen Dr. Dwyane Dee since 12/20. Ran out of methimazole refils. 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  TSH < 0.010. She has not followed up with Endo. On the chart there is a dismissal letter from Dr. Dwyane Dee dated 02/27/20. ?  ?Had f/u echo 04/02/20 EF 35-40%, RV normal. Mod-severe MR w/ tethering of posterior leaflet.  ?  ?Echo 02/2021 -EF 30-35% RV mildly. Bi atrial enlargement.  ?  ?She has missed multiple appointments. Of note she has had problems with her thyroid for some times but was dismissed from [facility] endocrinology.   She has not had this managed.  TSH 10/29/21 0.005.  ? ?Seen by Dr. Percival Spanish on 11/05/21 and directly admitted for a/c HFrEF and hyperthyroidism. Reinitiated on methimazole and prednisone. PICC placed for CVP and CO-OX monitoring. Diuresed with IV lasix then transitioned to po Torsemide. Overall diuresed 12 pounds. GDMT titrated. Did not add [place] d/t hx pyelonephritis. No entresto d/t intolerance, started on ACE alternatively. cMRI -  Course c/b recurrent abdominal pain. No clear etiology identified.EF 25% RV reduced. No LGE. ? ?F/U in HF clinic arranged.Please see below for hospital course by problem.  ? ?Hospital Course by Problem:  ?1. A/C HFrEF ?Onset in 2010. 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 ?- Echo 5/19 EF 20-25% with severe posterior MR.   ?- ECHO 12/2018 EF 35-40% mod-severe MR with restricted posterior leaflet  ?- Echo 2/21 EF 30-35% mod-severe MR RV ok  ?- Echo 04/02/20 EF 35-40% moderate to severe MR  ?- Echo  9/22 LVEF 30-35%, RV mildly reduced, Severe MR, Mild TR  ?- Echo this admission: EF 25-30%, severe LV dilation, mildly decreased RV function, severe LAE, moderate-severe MR with thickened mitral valve, moderate TR, IVC dilated.  ?- Admitted with NYHA IV symptoms. Decompensation in the setting of medication noncompliance, taking only Lasix and lisinopril at home, not on thyroid meds.   ?-Volume status stable.  ?- CVP 8. D/C on torsemide 40  mg daily ?- Considered adding [place]. However, previously avoided SGLT2i d/t multiple episodes of pyelonephritis in past. Denies recent GU symptoms. ?- Continue spironolactone 25 mg daily.  ?- Continue digoxin. Dig level < 0.2  ?- Continue Coreg 3.125 mg bid with tachycardia/hyperthyroidism and stable co-ox.   ?- Says she cannot take Entresto due to GI upset. Tolerating Lisinopril 20 mg daily. ?- CMRI - EF 25% RV reduced. No LGE ?- Noncompliant with treatment, not candidate for advanced therapies.  ?  ?2.  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   ?- She was dismissed from [facility].  She will need appointment with new endocrinologist.  ?- TSH <0.010, free T4 high.  ?- Contin methimazole 20 mg daily.  ?- Continue prednisone 20 mg daily, can taper down over time.  ?-Continue low dose beta blocker.  ?  ?3. Mitral regurgitation: Moderate-severe on echo this admission.  With severe LV and LA dilation, suspect component of functional MR but the mitral valve is also thickened towards the tips.  She is not inclined to invasive management/evaluation, hopefully MR will improve with medical management.  ?  ?4. RUQ pain: Sharp, transient but repetitive.   ?- RUQ US unremarkable. ?- Hepatic transaminases okay, direct and indirect bilirubin elevated ?- May need additional GI workup. Will set up f/u when she return for post hospital follow up.  ? ?Discharge Weight: 217 pounds  ? ?Discharge Vitals: Blood pressure (!) 108/96, pulse (!) 117, temperature 97.7 ?F (36.5 ?C), temperature source Oral, resp. rate 18, height 5\' 7"  (1.702 m), weight 98.6 kg, SpO2 99 %. ? ?Labs: ?Lab Results  ?Component Value Date  ? WBC 4.5 10/29/2021  ? HGB 14.0 10/29/2021  ? HCT 43.1 10/29/2021  ? MCV 78 (L) 10/29/2021  ? PLT 205 10/29/2021  ?  ?Recent Labs  ?Lab 11/07/21 ?1201 11/08/21 ?0500 11/09/21 ?0530  ?NA  --    < > 135  ?K  --    < > 4.1  ?CL  --    < > 103  ?CO2  --    < > 24  ?BUN  --    < > 10  ?CREATININE  --    < > 0.65  ?CALCIUM  --    < > 9.3  ?PROT 6.8  --   --   ?BILITOT 1.9*  --   --   ?ALKPHOS 51  --   --   ?ALT 32  --   --   ?AST 35  --   --   ?GLUCOSE  --    < > 95  ? < > = values in this interval not displayed.  ? ?No results found for: CHOL, HDL, LDLCALC, TRIG ?BNP (last 3 results) ?Recent Labs  ?  03/09/21 ?1005 10/29/21 ?0907 11/05/21 ?2310  ?BNP 501.9* 358.5* 509.5*  ? ? ?ProBNP (last 3  results) ?No results for input(s): PROBNP in the last 8760 hours. ? ? ?Diagnostic Studies/Procedures  ? ?MR CARDIAC MORPHOLOGY W WO CONTRAST ? ?Result Date: 11/08/2021 ?CLINICAL DATA:  67F with HFrEF (EF 25-30%). 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 A dedicated cardiac coil was used. Functional imaging was done using Fiesta sequences. 2,3, and 4 chamber views were done to assess for RWMA's. Modified Simpson's rule using a short axis stack was  used to calculate an ejection fraction on a dedicated work Conservation officer, nature. The patient received 10 cc of Gadavist. After 10 minutes inversion recovery sequences were used to assess for infiltration and scar tissue. CONTRAST:  10 cc  of Gadavist FINDINGS: Left ventricle: -Severe dilatation -Severe systolic dysfunction -Nonspecific ECV elevation (31%) -No LGE LV EF:  25% (Normal 56-78%) Absolute volumes: LV EDV: 366mL (Normal 52-141 mL) LV ESV: 263mL (Normal 13-51 mL) LV SV: 95mL (Normal 33-97 mL) CO: 9.3L/min (Normal 2.7-6.0 L/min) Indexed volumes: LV EDV: 125mL/sq-m (Normal 41-81 mL/sq-m) LV ESV: 139mL/sq-m (Normal 12-21 mL/sq-m) LV SV: 48mL/sq-m (Normal 26-56 mL/sq-m) CI: 4.3L/min/sq-m (Normal 1.8-3.8 L/min/sq-m) Right ventricle: Mild dilatation with moderate systolic dysfunction RV EF: 30% (Normal 47-80%) Absolute volumes: RV EDV: 261mL (Normal 58-154 mL) RV ESV: 16mL (Normal 12-68 mL) RV SV: 62mL (Normal 35-98 mL) CO: 6.9L/min (Normal 2.7-6 L/min) Indexed volumes: RV EDV: 167mL/sq-m (Normal 48-87 mL/sq-m) RV ESV: 68mL/sq-m (Normal 11-28 mL/sq-m) RV SV: 71mL/sq-m (Normal 27-57 mL/sq-m) CI: 3.2L/min/sq-m (Normal 1.8-3.8 L/min/sq-m) Left atrium: Severe enlargement Right atrium: Normal size Mitral valve: Moderate regurgitation (regurgitant fraction 36%) Aortic valve: Tricuspid.  No regurgitation Tricuspid valve: Moderate regurgitation (regurgitant fraction 23%)  Pulmonic valve: No regurgitation Aorta: Normal proximal ascending aorta Pericardium: Small to moderate effusion measuring 1mm adjacent to LV inferior wall IMPRESSION: 1.  Severe LV dilatation with severe systolic dysfu

## 2021-11-09 ENCOUNTER — Other Ambulatory Visit (HOSPITAL_COMMUNITY): Payer: Self-pay

## 2021-11-09 DIAGNOSIS — I5023 Acute on chronic systolic (congestive) heart failure: Secondary | ICD-10-CM | POA: Diagnosis not present

## 2021-11-09 LAB — COOXEMETRY PANEL
Carboxyhemoglobin: 1.8 % — ABNORMAL HIGH (ref 0.5–1.5)
Methemoglobin: <0.7 % (ref 0.0–1.5)
O2 Saturation: 60.2 %
Total hemoglobin: 14.4 g/dL (ref 12.0–16.0)

## 2021-11-09 LAB — BASIC METABOLIC PANEL
Anion gap: 8 (ref 5–15)
BUN: 10 mg/dL (ref 6–20)
CO2: 24 mmol/L (ref 22–32)
Calcium: 9.3 mg/dL (ref 8.9–10.3)
Chloride: 103 mmol/L (ref 98–111)
Creatinine, Ser: 0.65 mg/dL (ref 0.44–1.00)
GFR, Estimated: >60 mL/min (ref >60)
Glucose, Bld: 95 mg/dL (ref 70–99)
Potassium: 4.1 mmol/L (ref 3.5–5.1)
Sodium: 135 mmol/L (ref 135–145)

## 2021-11-09 LAB — DIGOXIN LEVEL: Digoxin Level: <0.2 ng/mL — ABNORMAL LOW (ref 0.8–2.0)

## 2021-11-09 MED ORDER — DIGOXIN 125 MCG PO TABS
0.1250 mg | ORAL_TABLET | Freq: Every day | ORAL | 6 refills | Status: DC
  Filled 2021-11-09: qty 30, 30d supply, fill #0

## 2021-11-09 MED ORDER — CARVEDILOL 3.125 MG PO TABS
3.1250 mg | ORAL_TABLET | Freq: Two times a day (BID) | ORAL | 6 refills | Status: DC
  Filled 2021-11-09: qty 60, 30d supply, fill #0

## 2021-11-09 MED ORDER — METHIMAZOLE 10 MG PO TABS
20.0000 mg | ORAL_TABLET | Freq: Every day | ORAL | 6 refills | Status: DC
  Filled 2021-11-09: qty 60, 30d supply, fill #0

## 2021-11-09 MED ORDER — PREDNISONE 20 MG PO TABS
20.0000 mg | ORAL_TABLET | Freq: Every day | ORAL | 6 refills | Status: DC
  Filled 2021-11-09: qty 30, 30d supply, fill #0

## 2021-11-09 MED ORDER — SPIRONOLACTONE 25 MG PO TABS
25.0000 mg | ORAL_TABLET | Freq: Every day | ORAL | 6 refills | Status: DC
  Filled 2021-11-09: qty 30, 30d supply, fill #0

## 2021-11-09 MED ORDER — TORSEMIDE 20 MG PO TABS
40.0000 mg | ORAL_TABLET | Freq: Every day | ORAL | 6 refills | Status: DC
  Filled 2021-11-09: qty 60, 30d supply, fill #0

## 2021-11-09 MED ORDER — LISINOPRIL 20 MG PO TABS
20.0000 mg | ORAL_TABLET | Freq: Every day | ORAL | 6 refills | Status: DC
  Filled 2021-11-09: qty 30, 30d supply, fill #0

## 2021-11-09 NOTE — Progress Notes (Addendum)
Discussed with pt HF booklet including daily wts, signs of fluid, importance of meds, low sodium, and exercise. She prefers to exercise inside and is thinking of buying a stationary bike. Will refer to [place] [facility]. She has good understanding of heart failure management. She plans to buy a scale and pill box from [employer] today. ?[id number] ?Ethelda Chick CES, ACSM ?1:13 PM ?11/09/2021 ? ?

## 2021-11-09 NOTE — Progress Notes (Addendum)
Patient ID: Sarah Reilly, female   DOB: 1981-03-17, 41 y.o.   MRN: [id number] ?  ? ? Advanced Heart Failure Rounding Note ? ?PCP-Cardiologist: Minus Breeding, MD  ? ?Subjective:   ? ?Wants to go home. Denies SOB.  ?Objective:   ?Weight Range: ?98.6 kg ?Body mass index is 34.05 kg/m?.  ? ?Vital Signs:   ?Temp:  [97.7 ?F (36.5 ?C)-98.4 ?F (36.9 ?C)] 97.7 ?F (36.5 ?C) (05/16 0726) ?Pulse Rate:  [96-111] 111 (05/16 0726) ?Resp:  [16-20] 20 (05/16 0726) ?BP: (100-113)/(72-91) 106/86 (05/16 0726) ?SpO2:  [94 %-100 %] 94 % (05/16 0726) ?Weight:  [98.6 kg] 98.6 kg (05/16 0042) ?Last BM Date : 11/08/21 ? ?Weight change: ?Filed Weights  ? 11/07/21 0455 11/08/21 0437 11/09/21 0042  ?Weight: 100.5 kg 98.5 kg 98.6 kg  ? ? ?Intake/Output:  ? ?Intake/Output Summary (Last 24 hours) at 11/09/2021 1017 ?Last data filed at 11/09/2021 0845 ?Gross per 24 hour  ?Intake 955 ml  ?Output 1400 ml  ?Net -445 ml  ?  ?CVP 8  ? ?Physical Exam  ? General:  Well appearing. No resp difficulty ?HEENT: normal ?Neck: supple. no JVD. Carotids 2+ bilat; no bruits. No lymphadenopathy or thryomegaly appreciated. ?Cor: PMI nondisplaced. Tachy regular rate & rhythm. No rubs, gallops or murmurs. ?Lungs: clear ?Abdomen: soft, nontender, nondistended. No hepatosplenomegaly. No bruits or masses. Good bowel sounds. ?Extremities: no cyanosis, clubbing, rash, edema. RUE PICC ?Neuro: alert & orientedx3, cranial nerves grossly intact. moves all 4 extremities w/o difficulty. Affect pleasant ? ? ?Telemetry  ? ?Sinus Tach 100s  ? ?Labs  ?  ?CBC ?No results for input(s): WBC, NEUTROABS, HGB, HCT, MCV, PLT in the last 72 hours. ?Basic Metabolic Panel ?Recent Labs  ?  11/08/21 ?0500 11/09/21 ?0530  ?NA 136 135  ?K 3.9 4.1  ?CL 103 103  ?CO2 25 24  ?GLUCOSE 124* 95  ?BUN 13 10  ?CREATININE 0.79 0.65  ?CALCIUM 9.5 9.3  ? ?Liver Function Tests ?Recent Labs  ?  11/07/21 ?1201  ?AST 35  ?ALT 32  ?ALKPHOS 51  ?BILITOT 1.9*  ?PROT 6.8  ?ALBUMIN 3.6  ? ?No results for input(s):  LIPASE, AMYLASE in the last 72 hours. ?Cardiac Enzymes ?No results for input(s): CKTOTAL, CKMB, CKMBINDEX, TROPONINI in the last 72 hours. ? ?BNP: ?BNP (last 3 results) ?Recent Labs  ?  03/09/21 ?1005 10/29/21 ?0907 11/05/21 ?2310  ?BNP 501.9* 358.5* 509.5*  ? ? ?ProBNP (last 3 results) ?No results for input(s): PROBNP in the last 8760 hours. ? ? ?D-Dimer ?No results for input(s): DDIMER in the last 72 hours. ?Hemoglobin A1C ?No results for input(s): HGBA1C in the last 72 hours. ?Fasting Lipid Panel ?No results for input(s): CHOL, HDL, LDLCALC, TRIG, CHOLHDL, LDLDIRECT in the last 72 hours. ?Thyroid Function Tests ?Recent Labs  ?  11/06/21 ?1120  ?T3FREE 4.8*  ? ? ?Other results: ? ? ?Imaging  ? ? ?MR CARDIAC MORPHOLOGY W WO CONTRAST ? ?Result Date: 11/08/2021 ?CLINICAL DATA:  46F with HFrEF (EF 25-30%). 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 A dedicated cardiac coil was used. Functional imaging was done using Fiesta sequences. 2,3, and 4 chamber views were done to assess for RWMA's. Modified Simpson's rule using a short axis stack was used to calculate an ejection fraction on a dedicated work Conservation officer, nature. The patient received 10 cc of Gadavist. After 10 minutes inversion recovery sequences were used to  assess for infiltration and scar tissue. CONTRAST:  10 cc  of Gadavist FINDINGS: Left ventricle: -Severe dilatation -Severe systolic dysfunction -Nonspecific ECV elevation (31%) -No LGE LV EF:  25% (Normal 56-78%) Absolute volumes: LV EDV: 379mL (Normal 52-141 mL) LV ESV: 266mL (Normal 13-51 mL) LV SV: 2mL (Normal 33-97 mL) CO: 9.3L/min (Normal 2.7-6.0 L/min) Indexed volumes: LV EDV: 122mL/sq-m (Normal 41-81 mL/sq-m) LV ESV: 117mL/sq-m (Normal 12-21 mL/sq-m) LV SV: 24mL/sq-m (Normal 26-56 mL/sq-m) CI: 4.3L/min/sq-m (Normal 1.8-3.8 L/min/sq-m) Right ventricle: Mild dilatation with moderate systolic  dysfunction RV EF: 30% (Normal 47-80%) Absolute volumes: RV EDV: 242mL (Normal 58-154 mL) RV ESV: 153mL (Normal 12-68 mL) RV SV: 59mL (Normal 35-98 mL) CO: 6.9L/min (Normal 2.7-6 L/min) Indexed volumes: RV EDV: 157mL/sq-m (Normal 48-87 mL/sq-m) RV ESV: 61mL/sq-m (Normal 11-28 mL/sq-m) RV SV: 34mL/sq-m (Normal 27-57 mL/sq-m) CI: 3.2L/min/sq-m (Normal 1.8-3.8 L/min/sq-m) Left atrium: Severe enlargement Right atrium: Normal size Mitral valve: Moderate regurgitation (regurgitant fraction 36%) Aortic valve: Tricuspid.  No regurgitation Tricuspid valve: Moderate regurgitation (regurgitant fraction 23%) Pulmonic valve: No regurgitation Aorta: Normal proximal ascending aorta Pericardium: Small to moderate effusion measuring 30mm adjacent to LV inferior wall IMPRESSION: 1.  Severe LV dilatation with severe systolic dysfunction (EF 123456) 2.  Mild RV dilatation with moderate systolic dysfunction (EF A999333) 3.  No late gadolinium enhancement to suggest myocardial scar 4.  Moderate mitral regurgitation (regurgitant fraction 36%) 5.  Moderate tricuspid regurgitation (regurgitant fraction 23%) 6. Small to moderate pericardial effusion measuring 20mm adjacent to LV inferior wall Electronically Signed   By: Oswaldo Milian M.D.   On: 11/08/2021 22:25   ? ? ?Medications:   ? ? ?Scheduled Medications: ? carvedilol  3.125 mg Oral BID WC  ? Chlorhexidine Gluconate Cloth  6 each Topical Daily  ? digoxin  0.125 mg Oral Daily  ? enoxaparin (LOVENOX) injection  40 mg Subcutaneous Q24H  ? lisinopril  20 mg Oral Daily  ? methimazole  20 mg Oral Daily  ? potassium chloride  40 mEq Oral BID  ? predniSONE  20 mg Oral Q breakfast  ? sodium chloride flush  10-40 mL Intracatheter Q12H  ? sodium chloride flush  3 mL Intravenous Q12H  ? spironolactone  25 mg Oral Daily  ? torsemide  40 mg Oral Daily  ? ? ?Infusions: ? sodium chloride    ? ? ?PRN Medications: ?sodium chloride, acetaminophen, ondansetron (ZOFRAN) IV, sodium chloride flush, sodium  chloride flush ? ? ? ?Assessment/Plan  ? ?1.A/C HFrEF ?Onset in 2010. 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 ?- Echo 5/19 EF 20-25% with severe posterior MR.   ?- ECHO 12/2018 EF 35-40% mod-severe MR with restricted posterior leaflet  ?- Echo 2/21 EF 30-35% mod-severe MR RV ok  ?- Echo 04/02/20 EF 35-40% moderate to severe MR  ?- Echo  9/22 LVEF 30-35%, RV mildly reduced, Severe MR, Mild TR  ?- Echo this admission: EF 25-30%, severe LV dilation, mildly decreased RV function, severe LAE, moderate-severe MR with thickened mitral valve, moderate TR, IVC dilated.  ?- Admitted with NYHA IV symptoms. Decompensation in the setting of medication noncompliance, taking only Lasix and lisinopril at home, not on thyroid meds.   ?-Volume status stable.  ?- CVP 8. D/C on torsemide 40 mg daily ?- Considered adding [place]. However, previously avoided SGLT2i d/t multiple episodes of pyelonephritis in past. Denies recent GU symptoms. ?- Continue spironolactone 25 mg daily.  ?- Continue digoxin. Dig level < 0.2  ?- Continue Coreg 3.125 mg bid  with tachycardia/hyperthyroidism and stable co-ox.   ?- Says she cannot take Entresto due to GI upset. Tolerating Lisinopril 20 mg daily. ?- CMRI - EF 25% RV reduced. No LGE ?- Noncompliant with treatment, not candidate for advanced therapies.  ?  ?2. 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   ?- She was dismissed from [facility].  She will need appointment with new endocrinologist.  ?- TSH <0.010, free T4 high.  ?- Contin methimazole 20 mg daily.  ?- Continue prednisone 20 mg daily, can taper down over time.  ?-Continue low dose beta blocker.  ?  ?3. Mitral regurgitation: Moderate-severe on echo this admission.  With severe LV and LA dilation, suspect component of functional MR but the mitral valve is also thickened  towards the tips.  She is not inclined to invasive management/evaluation, hopefully MR will improve with medical management.  ?  ?4. RUQ pain: Sharp, transient but repetitive.   ?- RUQ US unremarkable. ?

## 2021-11-09 NOTE — Progress Notes (Signed)
Discharge instructions given. Patient verbalized understanding and all questions were answered.  ?

## 2021-11-09 NOTE — TOC CM/SW Note (Addendum)
103 pm TOC CM arranged transportation with [facility] # [phone]. Conf # [id number]. Isidoro Donning RN3 CCM, Heart Failure TOC CM [phone]  ? ?HF TOC CM received referral for HF Outpatient Paramedicine. Pt appt at [facility] arranged for 6/15 at 930 am. Isidoro Donning RN3 CCM, Heart Failure TOC CM [phone]  ?

## 2021-11-09 NOTE — Significant Event (Signed)
Pt reviewed HF Booklet and discussed was to mange HF at home. No question at this time. ?

## 2021-11-09 NOTE — Consult Note (Addendum)
? ?  [facility] CM Inpatient Consult ? ? ?11/09/2021 ? ?[name] ?08-02-1980 ?[id number] ? ?Managed Medicaid [MM]: [employer] ? ?Primary Care Provider:  [name], MD ? ? ?Patient screened for hospitalization with as discussed in unit progression meeting for goals and barriers.  Review of patient's medical record reveals patient is has been active with the Advanced Heart Failure team. ? ?Met with the patient at the bedside. Explained to patient about MM team to assist with post hospital community follow up and needs.  Patient did identify ongoing issues with transportation to appointments however, she states she has notified MM [employer] of her transportation needs and is able to navigate that.  She speaks of not having an office appointment with her listed PCP only a virtual in the past 2 years. Gave her  an appointment reminder card with PCP and a 24 hour nurse advise line number. Spoke with her regarding connecting with the MM team and she agrees as she is still having food insecurity needs regarding her food stamps being cancelled.  Patient states she knows who to call and get this back in place. [See SDOH update]  Has a Education officer, museum and also notes from Advanced HF team social worker as well.   ?Patient denies issues with getting medications as long as the medications are called in to her pharmacy and ready when she them picks up and coordinating this with her transportation from the hospital. ? ?12:20 pm spoke with inpatient  Advanced HF [facility] RNCM regarding MM referral with RN.  Patient will also have a follow up with the HF Paramedicine team. ? ?Plan:  Referral to MM team to assist in care management and resources with her MM plan.   Will continue to monitor for any additional needs ? ?For questions contact:  ? ?Natividad Brood, RN BSN CCM ?[facility] Liaison ? [phone] business mobile phone ?Toll free office [phone]  ?Fax number:  [phone] ?[email or web address]  ? ? ? ?

## 2021-11-09 NOTE — Significant Event (Signed)
Pt up on the side of the bed with goal to go home today. No distress complaints over night. ?

## 2021-11-10 ENCOUNTER — Telehealth: Payer: Self-pay

## 2021-11-10 NOTE — Telephone Encounter (Signed)
Transition Care Management Unsuccessful Follow-up Telephone Call ? ?Date of discharge and from where:  11/09/2021 from [facility] ? ?Attempts:  1st Attempt ? ?Reason for unsuccessful TCM follow-up call:  Left voice message ? ? ? ?

## 2021-11-11 ENCOUNTER — Telehealth (HOSPITAL_COMMUNITY): Payer: Self-pay | Admitting: Licensed Clinical Social Worker

## 2021-11-11 NOTE — Progress Notes (Signed)
Heart and Vascular Care Navigation  11/11/2021  Sarah Reilly [birth year] [id number]  Reason for Referral:    Engaged with patient by telephone for initial visit for Heart and Vascular Care Coordination.                                                                                                   Paramedicine Initial Assessment:  Housing:  In what kind of housing do you live? House/apt/trailer/shelter? apartment  Do you rent/pay a mortgage/own? Rent- being raised to $700 a month ($30 increase in June) which pt expresses concerns about increased burden when she was already living check to check.  Do you live with anyone? Lives with 5 children and her children's father  Are you currently worried about losing your housing? Worried about keeping up with rent but not interested in income based housing due to locations of these apartments.  Social:  What is your current marital status? single  Do you have family or friends who live locally? Reports other family members live out of state.  Food:  Pt was getting food stamps but reports they were stopped in April (sounds like they missed recertification) pt now in process of reapplying for benefits.    CSW sent list of local pantries for pt.  Income:  What is your current source of income? Gets SSI- reports that her childrens father also sometimes helps with expenses when he is able.  How hard is it for you to pay for the basics like food housing, medical care, and utilities? hard  Insurance:  Are you currently insured? medicaid  Do you have prescription coverage? medicaid  Transportation:  Do you have transportation to your medical appointments? Utilizing medicaid healthy blue    Daily Health Needs: Do you have a working scale at home? Has just ordered one  How do you manage your medications at home? Taking them out of the bottle  Do you ever take your medications differently than prescribed? Reports she was out of  some of her medications recently but usually compliant when she has access to meds  Do you have issues affording your medications? No but interested in changing pharmacies to somewhere that can waive costs to help with other expenses  If yes, has this ever prevented you from obtaining medications?  Do you have any concerns with mobility at home?  Do you use any assistive devices at home or have PCS at home? no  Do you have a PCP? No- reports not wanting to see Dr. Alvis Lemmings who she was seeing- she denies need for assistance in finding alternative PCP.  Do you have any trouble reading or writing? no  Are there any additional barriers you see to getting the care you need?    Admitted to problems with anxiety but doesn't see anyone for this.  Not interested in being connected with provider at this time.                                HRT/VAS Care Coordination  Patients Home Cardiology Office [facility]   Outpatient Care Team Community Paramedicine   Social Worker Name: Octavio Graves, [place], [facility] [phone]   Living arrangements for the past 2 months Apartment   Lives with: Minor Children   Patient Current Insurance Coverage Medicaid   Patient Has Concern With Paying Medical Bills No   Does Patient Have Prescription Coverage? Yes   Home Assistive Devices/Equipment None       Social History:                                                                             SDOH Screenings   Alcohol Screen: Not on file  Depression (PHQ2-9): Not on file  Financial Resource Strain: High Risk   Difficulty of Paying Living Expenses: Very hard  Food Insecurity: Food Insecurity Present   Worried About Running Out of Food in the Last Year: Often true   Ran Out of Food in the Last Year: Sometimes true  Housing: Low Risk    Last Housing Risk Score: 0  Physical Activity: Not on file  Social Connections: Not on file  Stress: Not on file  Tobacco Use: Medium Risk    Smoking Tobacco Use: Former   Smokeless Tobacco Use: Never   Passive Exposure: Not on file  Transportation Needs: Personal assistant (Medical): Yes   Lack of Transportation (Non-Medical): Yes    SDOH Interventions: Financial Resources:    Gets $914/month in SSI and gets some extra support from the father of her children who lives with them- but states he cannot consistently help  Food Insecurity:   Had food stamps stopped- sent list of food pantries  Housing Insecurity:  Housing Interventions: Intervention Not Indicated  Transportation:       Follow-up plan:     CSW sending out referral to paramedics for assignment and follow up.   CSW will continue to follow through paramedicine program and assist as needed.  Burna Sis, LCSW Clinical Social Worker Advanced Heart Failure Clinic Desk#: [phone] Cell#: [phone]

## 2021-11-12 NOTE — Telephone Encounter (Signed)
Transition Care Management Unsuccessful Follow-up Telephone Call  Date of discharge and from where:  11/09/2021 from [facility]  Attempts:  2nd Attempt  Reason for unsuccessful TCM follow-up call:  Left voice message

## 2021-11-15 NOTE — Telephone Encounter (Signed)
Transition Care Management Follow-up Telephone Call Date of discharge and from where: 11/09/2021 from [facility] How have you been since you were released from the hospital? Patient stated that she is feeling okay. Patient did not have any questions or concerns at this time.  Any questions or concerns? No  Items Reviewed: Did the pt receive and understand the discharge instructions provided? Yes  Medications obtained and verified? Yes  Other? No  Any new allergies since your discharge? No  Dietary orders reviewed? No Do you have support at home? Yes   Functional Questionnaire: (I = Independent and D = Dependent) ADLs: I  Bathing/Dressing- I  Meal Prep- I  Eating- I  Maintaining continence- I  Transferring/Ambulation- I  Managing Meds- I   Follow up appointments reviewed:  PCP Hospital f/u appt confirmed? No   Specialist Hospital f/u appt confirmed? Yes  Scheduled to see Heart and Vasc on 11/18/2021 @ 3:00pm. Are transportation arrangements needed? No  If their condition worsens, is the pt aware to call PCP or go to the Emergency Dept.? Yes Was the patient provided with contact information for the PCP's office or ED? Yes Was to pt encouraged to call back with questions or concerns? Yes

## 2021-11-16 NOTE — Progress Notes (Signed)
Advanced Heart Failure Clinic Note  Primary Cardiologist: Dr Antoine Poche  Endocrinology: Dr Lucianne Muss PCP: Sarah Register, Sarah Reilly HF Sarah Reilly: Dr Gala Romney   HPI: Sarah Reilly is a 41 y.o. 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    She developed HF after the birth of her 4th child in 2010. She was in [place] at the time. Saw Dr. Antoine Poche EF 20%. Did not have a heart cath.  She then moved back to [place] to be by her mother.  She was followed at [place] by Dr. Fanny Bien.  She had a high-risk pregnancy and had her 5th Child in 20. Apparently EF dropped to 15-20% for a period. They suggested ICD but she refused. She returned to [place].  In July 2019 she was diagnosed with hyperthyroidism. Started on methimazole bu Dr Lucianne Muss.   Previously she was having CP in setting of 30 pound weight gain. Dr. Gala Romney suggested R/L cath but she refused saying she wanted to try and lose weight first and see how she felt. We eventually rescheduled but she couldn't get COVID test.   She was seen in [facility] 8/21 and was quite tachycardic. She had not seen Dr. Lucianne Muss since 12/20. Ran out of methimazole refils. 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  TSH < 0.010. She has not followed up with Endo. On the chart there is a dismissal letter from Dr. Lucianne Muss dated 02/27/20.  Had f/u echo 04/02/20 EF 35-40%, RV normal. Mod-severe MR w/ tethering of posterior leaflet.   Echo 02/2021 -EF 30-35% RV mildly. Bi atrial enlargement.    She has missed multiple appointments. Of note she has had problems with her thyroid but was dismissed from [facility] endocrinology.  She has not had this managed.  TSH 10/29/21 0.005.    Saw Dr Antoine Poche 11/05/21 with increased shortness of breath, orthopnea, and PND. Admitted with A/C HFrEF. TSH 0.005. Echo showed EF 25-30%.  Diuresed with IV lasix. cMRI showed LVEF 25%, RV reduced, no LGE. No clear etiology. GDMT titrated. No Entresto d/t intolerance, started on ACE. Course c/b recurrent abdominal pain. Discharged home, weight 210 lbs.  Today she returns for post hospital HF follow up. Overall feeling "awful". Body hurts and sciatica acting up. Main complaint is blurred vision x 2 weeks and ears feel stopped up. Feeling palpitations and occasional positional dizziness. Mild dyspnea with lifting laundry or walking up flight of stairs. Denies CP, abnormal bleeding, edema, or PND/Orthopnea. Appetite ok. No fever or chills. Weight at home 206 pounds. Taking all medications. Abdominal pain improving. Lives at home with 3 of her 5 children.  Cardiac Studies: - cMRI (5/23): LVEF 25%, RVEF 30%, no LGE, moderate MR and TR  - Echo (5/23): EF 25-30%, severe LV dysfunction with global HK, RV mildly down, mild to moderate MR, moderate TR  Echo 03/09/21 EF 30-35%, RV mildly reduced, severe MR, mild-mod TR  Echo 04/02/20 EF 35-40% Personally reviewed Echo 2/21 LV dilated/spherical 30-35% likely severe MR. RV ok  Echo 12/2018 EF 35-40% Mod-Severe MR, RV normal Echo 11/16/2017 EF 20-25% Severe MR   ROS: All systems negative except as listed in HPI, PMH and Problem List.  SH:  Social History   Socioeconomic History   Marital status: Single    Spouse name: Not on file   Number of children: Not on file  Years of education: Not on file   Highest education level: Not on file  Occupational History   Not on file  Tobacco Use   Smoking status: Former   Smokeless tobacco: Never  Substance and Sexual Activity   Alcohol use: Yes    Alcohol/week: 4.0 standard drinks    Types: 3 Glasses of wine, 1 Standard drinks or equivalent per week   Drug use: Never   Sexual activity: Not on file  Other Topics Concern   Not on file  Social History Narrative   She has 4 children ages 15 and under.  She stays at home. She does not smoke  cigarettes or drink alcohol. Does not get regular exercise.    Social Determinants of Health   Financial Resource Strain: High Risk   Difficulty of Paying Living Expenses: Very hard  Food Insecurity: Landscape architect Present   Worried About Charity fundraiser in the Last Year: Often true   Arboriculturist in the Last Year: Sometimes true  Transportation Needs: Public librarian (Medical): Yes   Lack of Transportation (Non-Medical): Yes  Physical Activity: Not on file  Stress: Not on file  Social Connections: Not on file  Intimate Partner Violence: Not on file    FH:  Family History  Problem Relation Age of Onset   Heart disease Mother        No details   Hypertension Mother    Diabetes Mother    Hypertension Father    Heart murmur Sister    Past Medical History:  Diagnosis Date   Cardiomyopathy    CHF (congestive heart failure) (HCC)    HTN (hypertension)    Mitral regurgitation    Current Outpatient Medications  Medication Sig Dispense Refill   acetaminophen (TYLENOL) 500 MG tablet Take 500-1,000 mg by mouth 4 (four) times daily as needed (for pain).     carvedilol (COREG) 3.125 MG tablet Take 1 tablet (3.125 mg total) by mouth 2 (two) times daily with a meal. 60 tablet 6   digoxin (LANOXIN) 0.125 MG tablet Take 1 tablet (0.125 mg total) by mouth daily. 30 tablet 6   lisinopril (ZESTRIL) 20 MG tablet Take 1 tablet (20 mg total) by mouth daily. 30 tablet 6   methimazole (TAPAZOLE) 10 MG tablet Take 2 tablets (20 mg total) by mouth daily. 60 tablet 6   potassium chloride SA (KLOR-CON M) 20 MEQ tablet Take 1 tablet (20 mEq total) by mouth daily. (Patient taking differently: Take 40 mEq by mouth daily.) 90 tablet 3   predniSONE (DELTASONE) 20 MG tablet Take 1 tablet (20 mg total) by mouth daily with breakfast. 30 tablet 6   spironolactone (ALDACTONE) 25 MG tablet Take 1 tablet (25 mg total) by mouth daily. 30 tablet 6   torsemide (DEMADEX)  20 MG tablet Take 2 tablets (40 mg total) by mouth daily. 120 tablet 6   No current facility-administered medications for this encounter.   BP (!) 128/104   Pulse 91   Ht 5\' 7"  (1.702 m)   Wt 93.9 kg (207 lb)   SpO2 99%   BMI 32.42 kg/m   Wt Readings from Last 3 Encounters:  11/18/21 93.9 kg (207 lb)  11/09/21 98.6 kg (217 lb 6 oz)  11/05/21 105.7 kg (233 lb)   PHYSICAL EXAM: General:  NAD. No resp difficulty HEENT: Normal Neck: Supple. No JVD. Carotids 2+ bilat; no bruits. No lymphadenopathy or thryomegaly appreciated. Cor: PMI  nondisplaced. Regular rate & rhythm. No rubs, gallops, 3/6 mR. Lungs: Clear Abdomen: Obese, soft,  nontender, nondistended. No hepatosplenomegaly. No bruits or masses. Good bowel sounds. Extremities: No cyanosis, clubbing, rash, edema Neuro: Alert & oriented x 3, cranial nerves grossly intact. Moves all 4 extremities w/o difficulty. Affect pleasant.  ECG (personally reviewed): NSR 86 bpm + LVH  ASSESSMENT & PLAN: Chronic Systolic Heart Failure Onset in 2010. 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 - Echo 5/19 EF 20-25% with severe posterior MR.   - ECHO 12/2018 EF 35-40% mod-severe MR with restricted posterior leaflet  - Echo 2/21 EF 30-35% mod-severe MR RV ok  - Echo 04/02/20 EF 35-40% moderate to severe MR  - Echo  9/22 LVEF 30-35%, RV mildly reduced, Severe MR, Mild TR  - Echo (5/23): EF 25-30%, severe LV dilation, mildly decreased RV function, severe LAE, moderate-severe MR with thickened mitral valve, moderate TR, IVC dilated.  - CMRI (5/23): LVEF 25% RVEF reduced. No LGE. - NYHA II, volume stable today. - Continue lisinopril 20 mg daily (no Entresto due to GI upset). - Continue torsemide 40 mg daily. - Continue spironolactone 25 mg daily.  - Continue digoxin 0.125 mg daily. - Continue Coreg 3.125 mg bid with tachycardia/hyperthyroidism.  - No SGLT2i d/t multiple episodes of pyelonephritis in past.  Denies recent GU symptoms.  - Noncompliant with treatment, not candidate for advanced therapies.  - Labs today.   2. Hyperthyroidism  - Previously on methimazole and followed by Dr Dwyane Dee.  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   - She was dismissed from [facility] Endocrinology.   - TSH <0.010, free T4 high.  - Contin methimazole 20 mg daily.  - Continue prednisone 20 mg daily, can taper down over time.  - Continue low dose beta blocker.  - She will need appointment with new endocrinologist, will arrange today..    3. Mitral regurgitation  - Moderate-severe on echo 5/23.  With severe LV and LA dilation, suspect component of functional MR but the mitral valve is also thickened towards the tips.   - She is not inclined to invasive management/evaluation, hopefully MR will improve with medical management.    4. RUQ pain  - RUQ US unremarkable. - Hepatic transaminases okay, direct and indirect bilirubin elevated. - Improved today. - CMET today.  5. Blurred vision - On-going. 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 - Check dig level today. - Needs eye appt and PCP follow up.  6. SDOH - Transportation through [employer]. - She has a phone, scale and can afford medications. - Food stamps have stopped, engage HFSW. - She needs PCP follow up, wishes to change providers.    Keep follow up next month with Dr. Haroldine Laws as scheduled.   Allena Katz, FNP-BC 11/18/21

## 2021-11-17 ENCOUNTER — Telehealth (HOSPITAL_COMMUNITY): Payer: Self-pay

## 2021-11-17 NOTE — Telephone Encounter (Signed)
Called and left patient a detailed message  to confirm/remind patient of their appointment at the [facility] on 11/18/21.

## 2021-11-18 ENCOUNTER — Ambulatory Visit (HOSPITAL_COMMUNITY)
Admit: 2021-11-18 | Discharge: 2021-11-18 | Disposition: A | Discharge status: Home / Self Care | Payer: Medicaid Other | Attending: Family Medicine | Admitting: Family Medicine

## 2021-11-18 ENCOUNTER — Encounter (HOSPITAL_COMMUNITY): Payer: Self-pay

## 2021-11-18 VITALS — BP 128/104 | HR 91 | Ht 67.0 in | Wt 207.0 lb

## 2021-11-18 DIAGNOSIS — E0590 Thyrotoxicosis, unspecified without thyrotoxic crisis or storm: Secondary | ICD-10-CM | POA: Diagnosis not present

## 2021-11-18 DIAGNOSIS — I5022 Chronic systolic (congestive) heart failure: Secondary | ICD-10-CM

## 2021-11-18 DIAGNOSIS — R000 Tachycardia, unspecified: Secondary | ICD-10-CM | POA: Insufficient documentation

## 2021-11-18 DIAGNOSIS — I110 Hypertensive heart disease with heart failure: Secondary | ICD-10-CM | POA: Diagnosis not present

## 2021-11-18 DIAGNOSIS — R1011 Right upper quadrant pain: Secondary | ICD-10-CM | POA: Insufficient documentation

## 2021-11-18 DIAGNOSIS — H538 Other visual disturbances: Secondary | ICD-10-CM

## 2021-11-18 DIAGNOSIS — O99210 Obesity complicating pregnancy, unspecified trimester: Secondary | ICD-10-CM | POA: Insufficient documentation

## 2021-11-18 DIAGNOSIS — K30 Functional dyspepsia: Secondary | ICD-10-CM | POA: Diagnosis not present

## 2021-11-18 DIAGNOSIS — Z7952 Long term (current) use of systemic steroids: Secondary | ICD-10-CM | POA: Diagnosis not present

## 2021-11-18 DIAGNOSIS — R109 Unspecified abdominal pain: Secondary | ICD-10-CM

## 2021-11-18 DIAGNOSIS — Z91199 Patient's noncompliance with other medical treatment and regimen due to unspecified reason: Secondary | ICD-10-CM | POA: Diagnosis not present

## 2021-11-18 DIAGNOSIS — Z79899 Other long term (current) drug therapy: Secondary | ICD-10-CM | POA: Insufficient documentation

## 2021-11-18 DIAGNOSIS — I340 Nonrheumatic mitral (valve) insufficiency: Secondary | ICD-10-CM | POA: Diagnosis not present

## 2021-11-18 DIAGNOSIS — Z139 Encounter for screening, unspecified: Secondary | ICD-10-CM

## 2021-11-18 LAB — COMPREHENSIVE METABOLIC PANEL
ALT: 63 U/L — ABNORMAL HIGH (ref 0–44)
AST: 49 U/L — ABNORMAL HIGH (ref 15–41)
Albumin: 4 g/dL (ref 3.5–5.0)
Alkaline Phosphatase: 82 U/L (ref 38–126)
Anion gap: 14 (ref 5–15)
BUN: 20 mg/dL (ref 6–20)
CO2: 17 mmol/L — ABNORMAL LOW (ref 22–32)
Calcium: 9.7 mg/dL (ref 8.9–10.3)
Chloride: 101 mmol/L (ref 98–111)
Creatinine, Ser: 0.84 mg/dL (ref 0.44–1.00)
GFR, Estimated: >60 mL/min (ref >60)
Glucose, Bld: 143 mg/dL — ABNORMAL HIGH (ref 70–99)
Potassium: 4.5 mmol/L (ref 3.5–5.1)
Sodium: 132 mmol/L — ABNORMAL LOW (ref 135–145)
Total Bilirubin: 2 mg/dL — ABNORMAL HIGH (ref 0.3–1.2)
Total Protein: 8.3 g/dL — ABNORMAL HIGH (ref 6.5–8.1)

## 2021-11-18 LAB — DIGOXIN LEVEL: Digoxin Level: 0.6 ng/mL — ABNORMAL LOW (ref 0.8–2.0)

## 2021-11-18 NOTE — Patient Instructions (Addendum)
There has been no changes to your medications.  Labs done today, your results will be available in MyChart, we will contact you for abnormal readings.  Keep follow up with Dr. Gala Romney as scheduled.  You have been referred to endocrinology.  They will call you to arrange your appointment.  Please follow up with you Family Doctor.   Please arrange an appointment with an Optician as soon as you can   If you have any questions or concerns before your next appointment please send [place] a message through [place] or call our office at [phone].    TO LEAVE A MESSAGE FOR THE NURSE SELECT OPTION 2, PLEASE LEAVE A MESSAGE INCLUDING: YOUR NAME DATE OF BIRTH CALL BACK NUMBER REASON FOR CALL**this is important as we prioritize the call backs  YOU WILL RECEIVE A CALL BACK THE SAME DAY AS LONG AS YOU CALL BEFORE 4:00 PM  At the Advanced Heart Failure Clinic, you and your health needs are our priority. As part of our continuing mission to provide you with exceptional heart care, we have created designated Provider Care Teams. These Care Teams include your primary Cardiologist (physician) and Advanced Practice Providers (APPs- Physician Assistants and Nurse Practitioners) who all work together to provide you with the care you need, when you need it.   You may see any of the following providers on your designated Care Team at your next follow up: Dr Arvilla Meres Dr Carron Curie, NP Robbie Lis, [place] [facility], [place], [place] Karle Plumber, PharmD   Please be sure to bring in all your medications bottles to every appointment.

## 2021-11-18 NOTE — Progress Notes (Signed)
CSW checked in with pt regarding lack of foods stamps.  Pt didn't complete re certification in time so benefits stopped- she plans to call them today to speak with director regarding this.  Pt is handling situation and working on resolving- CSW had sent out list of food pantry options as well to help while she awaits benefits being restarted.  Will continue to follow and assist as needed  Jorge Ny, [facility] Desk#: [phone] Cell#: [phone]

## 2021-11-19 ENCOUNTER — Telehealth (HOSPITAL_COMMUNITY): Payer: Self-pay | Admitting: Surgery

## 2021-11-19 NOTE — Telephone Encounter (Signed)
-----   Message from Jacklynn Ganong, [place] sent at 11/19/2021  7:49 AM EDT ----- Labs ok, AST/ALT mildly elevated.   Total bili elevated. Please make sure she has follow up with PCP for further work up.

## 2021-11-19 NOTE — Telephone Encounter (Signed)
attempted to reach patient regardng results and recommendatiosn per provider.  I left a message for a return call.

## 2021-11-23 ENCOUNTER — Other Ambulatory Visit: Payer: Self-pay

## 2021-11-23 NOTE — Patient Instructions (Signed)
Visit Information  Sarah Reilly was given information about Medicaid Managed Care team care coordination services as a part of their Healthy [facility] Medicaid benefit. Sarah Reilly verbally consented to engagement with the [facility] Managed Care team.   If you are experiencing a medical emergency, please call 911 or report to your local emergency department or urgent care.   If you have a non-emergency medical problem during routine business hours, please contact your provider's office and ask to speak with a nurse.   For questions related to your Healthy [facility] health plan, please call: [phone] or visit the homepage here: [email or web address]  If you would like to schedule transportation through your Healthy [facility] plan, please call the following number at least 2 days in advance of your appointment: [phone]  For information about your ride after you set it up, call Ride Assist at [phone]. Use this number to activate a Will Call pickup, or if your transportation is late for a scheduled pickup. Use this number, too, if you need to make a change or cancel a previously scheduled reservation.  If you need transportation services right away, call [phone]. The after-hours call center is staffed 24 hours to handle ride assistance and urgent reservation requests (including discharges) 365 days a year. Urgent trips include sick visits, hospital discharge requests and life-sustaining treatment.  Call the [place] at [phone], at any time, 24 hours a day, 7 days a week. If you are in danger or need immediate medical attention call 911.  If you would like help to quit smoking, call 1-800-QUIT-NOW 435-458-6144) OR Espaol: 1-855-Djelo-Ya (3-295-188-4166) o para ms informacin haga clic aqu or Text READY to 200-400 to register via text  Sarah Reilly - following are the goals we discussed in your visit today:   See Patient Goals in the [place] of Care below.  Please see education materials related to today's visit provided by MyChart link.  Patient verbalizes understanding of instructions and care plan provided today and agrees to view in [place]. Active MyChart status and patient understanding of how to access instructions and care plan via MyChart confirmed with patient.     The Managed Medicaid care management team will reach out to the patient again over the next 30 days.   Salvatore Marvel RN, BSN Community Care Coordinator [facility] Mobile: [phone]   Following is a copy of your plan of care:  Care Plan : [place] of Care  Updates made by Inge Rise, RN since 11/23/2021 12:00 AM     Problem: Chronic Disease Management and Care Coordination Needs for CHF, HTN, Hyperthyroidism   Priority: High  Onset Date: 11/23/2021     Goal: Development of Plan of Care for Chronic Disease Management and Care Coordination Needs (CHF, HTN, Hyperthyroidism)   Start Date: 11/23/2021  Expected End Date: 03/23/2022  Priority: High  Note:   Current Barriers:  Knowledge Deficits related to plan of care for management of CHF, HTN, and Hyperthyroidism  Chronic Disease Management support and education needs related to CHF, HTN, and Hyperthyroidism Lacks caregiver support.        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 No Advanced Directives in place  RNCM Clinical Goal(s):  Patient will verbalize understanding of plan for management of CHF, HTN, and Hyperthyroidism as evidenced by improved management of these chronic diseases verbalize basic understanding of CHF, HTN, and Hyperthyroidism disease process and self health management  plan as evidenced by improved management of these chronic diseases take all medications exactly as prescribed and will call provider for medication related questions as evidenced by  being compliant with all prescribed medications    attend all scheduled medical appointments: Working on getting new PCP appointment in place of scheduled appointment with Dr. Alvis Lemmings on 12/09/2021; 12/21/21 New patient visit with Dr. Ronny Bacon (Endocrinologist); 01/07/2022 Dr. Gala Romney as evidenced by attending all scheduled appointments        demonstrate improved adherence to prescribed treatment plan for CHF, HTN, and Hyperthyroidism as evidenced by improved management of these diseases. continue to work with Medical illustrator and/or Social Worker to address care management and care coordination needs related to CHF, HTN, and Hyperthyroidism as evidenced by adherence to CM Team Scheduled appointments     through collaboration with Medical illustrator, provider, and care team.   Interventions: Inter-disciplinary care team collaboration (see longitudinal plan of care) Evaluation of current treatment plan related to  self management and patient's adherence to plan as established by provider   Heart Failure Interventions:  (Status: New goal.)  Long Term Goal  Wt Readings from Last 3 Encounters:  11/18/21 207 lb (93.9 kg)  11/09/21 217 lb 6 oz (98.6 kg)  11/05/21 233 lb (105.7 kg)  Basic overview and discussion of pathophysiology of Heart Failure reviewed Provided education on low sodium diet Assessed need for readable accurate scales in home Provided education about placing scale on hard, flat surface Advised patient to weigh each morning after emptying bladder Discussed importance of daily weight and advised patient to weigh and record daily Reviewed role of diuretics in prevention of fluid overload and management of heart failure Discussed the importance of keeping all appointments with provider Screening for signs and symptoms of depression related to chronic disease state  Assessed social determinant of health barriers Patient denies any swelling, shortness of breath or chest pain.    Patient reports she has lost 30 lbs since admission to the hospital on 11/05/2021. Assessed patient's knowledge of low sodium diet.  Patient states she does not use salt or any salt substitutes.  Patient flavors food by adding garlic and crushed peppers.  Patient states she knows how to read food labels for sodium content.  Hyperthyroidism  (Status: New goal.) Long Term Goal  Evaluation of current treatment plan related to  Hyperthyroidism , Financial constraints related to increased rent and having to reapply for Food Stamps (done)  self-management and patient's adherence to plan as established by provider. Discussed plans with patient for ongoing care management follow up and provided patient with direct contact information for care management team Provided education to patient re: signs & symptoms of Hyperthyroidism; Reviewed medications with patient and discussed importance of medication compliance; Reviewed scheduled/upcoming provider appointments including : See appointments in RNCM Clinical Goals ; Screening for signs and symptoms of depression related to chronic disease state;  Assessed social determinant of health barriers;  RN Care Manager reviewed most recent lab results for thyroid function - abnormal/elevated.  Hypertension: (Status: New goal.) Long Term Goal  Last practice recorded BP readings:  BP Readings from Last 3 Encounters:  11/18/21 (!) 128/104  11/09/21 (!) 108/96  11/05/21 (!) 126/98  Most recent eGFR/CrCl:  Lab Results  Component Value Date   EGFR 104 10/29/2021    No components found for: CRCL  Evaluation of current treatment plan related to hypertension self management and patient's adherence to plan as established by provider;  Reviewed prescribed diet low sodium  Reviewed medications with patient and discussed importance of compliance;  Provided assistance with obtaining home blood pressure monitor via PCP order to [facility];  Counseled on the  importance of exercise goals with target of 150 minutes per week Discussed plans with patient for ongoing care management follow up and provided patient with direct contact information for care management team; Reviewed scheduled/upcoming provider appointments including:  Screening for signs and symptoms of depression related to chronic disease state;  Assessed social determinant of health barriers;  Patient reports she was not taking lisinopril since hospital discharge on 11/09/2021.  Patient thought she was told to stop lisinopril.  It is clear on hospital discharge orders that patient is to take lisinopril daily.  I instructed patient to restart taking Lisinopril daily as ordered.  Patient verbalized understanding. Patient states she has difficulty exercising due to her fear of going outside which interferes with patient exercising such as walking.  Patient Goals/Self-Care Activities: Take medications as prescribed   Call pharmacy for medication refills 3-7 days in advance of running out of medications Perform all self care activities independently  Perform IADL's (shopping, preparing meals, housekeeping, managing finances) independently Call provider office for new concerns or questions  call office if I gain more than 2 pounds in one day or 5 pounds in one week watch for swelling in feet, ankles and legs every day weigh myself daily keep all doctor appointments take medications for blood pressure exactly as prescribed Work with RN Care Manager to obtain a blood pressure monitor via [employer].

## 2021-11-23 NOTE — Patient Outreach (Signed)
Medicaid Managed Care   Nurse Care Manager Note  11/23/2021 Name:  Sarah Reilly MRN:  [id number] DOB:  October 12, 1980  Sarah Reilly is an 41 y.o. year old female who is a primary patient of [name], MD.  [facility] Managed Care Coordination team was consulted for assistance with:    CHF HTN Hyperthyroidism  Ms. Saal was given information about Medicaid Managed Care Coordination team services today. Sarah Reilly Patient agreed to services and verbal consent obtained.  Engaged with patient by telephone for initial visit in response to provider referral for case management and/or care coordination services.   Assessments/Interventions:  Review of past medical history, allergies, medications, health status, including review of consultants reports, laboratory and other test data, was performed as part of comprehensive evaluation and provision of chronic care management services.  SDOH (Social Determinants of Health) assessments and interventions performed: SDOH Interventions    Flowsheet Row Most Recent Value  SDOH Interventions   Food Insecurity Interventions Other (Comment)  [In the process of reapplying for Food Stamps]  Financial Strain Interventions Intervention Not Indicated  Housing Interventions Intervention Not Indicated  Physical Activity Interventions Other (Comments)  [Patient states she is fearful of going outside and prevents her from walking/exercising outside.]  Stress Interventions Intervention Not Indicated  Social Connections Interventions Intervention Not Indicated  Transportation Interventions Other (Comment)  [Currently connected to [place]  Allergies  Allergen Reactions   Celery Oil Anaphylaxis   Shellfish Allergy Anaphylaxis   Entresto [Sacubitril-Valsartan] Other (See Comments)    GI upset   Hydrocodone Other (See Comments)    Reaction happened in the spine   Latex Rash and Other (See Comments)     EKG leads cause rashes- cannot tolerate for any extended length of time    Medications Reviewed Today     Reviewed by Inge Rise, RN (Case Manager) on 11/23/21 at [place] Status: <None>   Medication Order Taking? Sig Documenting Provider Last Dose Status Informant  acetaminophen (TYLENOL) 500 MG tablet [id number] Yes Take 500-1,000 mg by mouth 4 (four) times daily as needed (for pain). [provider] Taking Active Self  carvedilol (COREG) 3.125 MG tablet [id number] Yes Take 1 tablet (3.125 mg total) by mouth 2 (two) times daily with a meal. Clegg, Amy D, NP Taking Active   digoxin (LANOXIN) 0.125 MG tablet [id number] Yes Take 1 tablet (0.125 mg total) by mouth daily. Clegg, Amy D, NP Taking Active   lisinopril (ZESTRIL) 20 MG tablet [id number] Yes Take 1 tablet (20 mg total) by mouth daily. Clegg, Amy D, NP Taking Active   methimazole (TAPAZOLE) 10 MG tablet [id number] Yes Take 2 tablets (20 mg total) by mouth daily. Darrick Grinder D, NP Taking Active   potassium chloride SA (KLOR-CON M) 20 MEQ tablet [id number] Yes Take 1 tablet (20 mEq total) by mouth daily.  Patient taking differently: Take 40 mEq by mouth daily.   Minus Breeding, MD Taking Active Self  predniSONE (DELTASONE) 20 MG tablet [id number] Yes Take 1 tablet (20 mg total) by mouth daily with breakfast. Ninfa Meeker, Amy D, NP Taking Active   spironolactone (ALDACTONE) 25 MG tablet [id number] Yes Take 1 tablet (25 mg total) by mouth daily. Darrick Grinder D, NP Taking Active   torsemide (DEMADEX) 20 MG tablet [id number] Yes Take 2 tablets (40 mg total) by mouth daily. 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   SOB (shortness of breath) 11/03/2017   Severe mitral regurgitation 07/20/2009   CHEST PAIN 07/20/2009   Essential hypertension 06/16/2009   AGORAPHOBIA 04/22/2009   MIGRAINE HEADACHE 04/22/2009   PYELONEPHRITIS 04/22/2009   SYNCOPE 04/22/2009   DIZZINESS 04/22/2009   PALPITATIONS 04/22/2009   ELECTROCARDIOGRAM, ABNORMAL 04/22/2009    Conditions to be addressed/monitored per PCP order:  CHF, HTN, and Hyperthyroidism  Care Plan : RN Care Manager Plan of Care  Updates made by Inge Rise, RN since 11/23/2021 12:00 AM     Problem: Chronic Disease Management and Care Coordination Needs for CHF, HTN, Hyperthyroidism   Priority: High  Onset Date: 11/23/2021     Goal: Development of Plan of Care for Chronic Disease Management and Care Coordination Needs (CHF, HTN, Hyperthyroidism)   Start Date: 11/23/2021  Expected End Date: 03/23/2022  Priority: High  Note:   Current Barriers:  Knowledge Deficits related to plan of care for management of CHF, HTN, and Hyperthyroidism  Chronic Disease Management support and education needs related to CHF, HTN, and Hyperthyroidism Lacks caregiver support.        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 No Advanced Directives in place  RNCM Clinical Goal(s):  Patient will verbalize understanding of plan for management of CHF, HTN, and Hyperthyroidism as evidenced by improved management of these chronic diseases verbalize basic understanding of CHF, HTN, and Hyperthyroidism disease process and self health management plan as evidenced by improved management of these chronic diseases take all medications exactly as prescribed and will call provider for medication related questions as evidenced by being compliant with all prescribed medications    attend  all scheduled medical appointments: Working on getting new PCP appointment in place of scheduled appointment with Dr. Margarita Rana on 12/09/2021; 12/21/21 New patient visit with Dr. Rayetta Pigg (Endocrinologist); 01/07/2022 Dr. Haroldine Laws as evidenced by attending all scheduled appointments        demonstrate improved adherence to prescribed treatment plan for CHF, HTN, and Hyperthyroidism as evidenced by improved management of these diseases. continue to work with Consulting civil engineer and/or Social Worker to address care management and care coordination needs related to CHF, HTN, and Hyperthyroidism as evidenced by adherence to CM Team Scheduled appointments     through collaboration with Consulting civil engineer, provider, and care team.   Interventions: Inter-disciplinary care team collaboration (see longitudinal plan of care) Evaluation of current treatment plan related to  self management and patient's adherence to plan as established by provider   Heart Failure Interventions:  (Status: New goal.)  Long Term Goal  Wt Readings from Last 3 Encounters:  11/18/21 207 lb (93.9 kg)  11/09/21 217 lb 6 oz (98.6 kg)  11/05/21 233 lb (105.7 kg)  Basic overview and discussion of pathophysiology of Heart Failure reviewed Provided education on low sodium diet Assessed need for readable accurate scales in home Provided education about placing scale on hard, flat surface Advised patient to weigh each morning after emptying bladder Discussed importance of daily weight and advised patient to weigh and record  daily Reviewed role of diuretics in prevention of fluid overload and management of heart failure Discussed the importance of keeping all appointments with provider Screening for signs and symptoms of depression related to chronic disease state  Assessed social determinant of health barriers Patient denies any swelling, shortness of breath or chest pain.   Patient reports she has lost 30 lbs since admission to the  hospital on 11/05/2021. Assessed patient's knowledge of low sodium diet.  Patient states she does not use salt or any salt substitutes.  Patient flavors food by adding garlic and crushed peppers.  Patient states she knows how to read food labels for sodium content.  Hyperthyroidism  (Status: New goal.) Long Term Goal  Evaluation of current treatment plan related to  Hyperthyroidism , Financial constraints related to increased rent and having to reapply for Food Stamps (done)  self-management and patient's adherence to plan as established by provider. Discussed plans with patient for ongoing care management follow up and provided patient with direct contact information for care management team Provided education to patient re: signs & symptoms of Hyperthyroidism; Reviewed medications with patient and discussed importance of medication compliance; Reviewed scheduled/upcoming provider appointments including : See appointments in [place] ; Screening for signs and symptoms of depression related to chronic disease state;  Assessed social determinant of health barriers;  RN Care Manager reviewed most recent lab results for thyroid function - abnormal/elevated.  Hypertension: (Status: New goal.) Long Term Goal  Last practice recorded BP readings:  BP Readings from Last 3 Encounters:  11/18/21 (!) 128/104  11/09/21 (!) 108/96  11/05/21 (!) 126/98  Most recent eGFR/CrCl:  Lab Results  Component Value Date   EGFR 104 10/29/2021    No components found for: CRCL  Evaluation of current treatment plan related to hypertension self management and patient's adherence to plan as established by provider;   Reviewed prescribed diet low sodium  Reviewed medications with patient and discussed importance of compliance;  Provided assistance with obtaining home blood pressure monitor via PCP order to [place];  Counseled on the importance of exercise goals with target of 150 minutes per  week Discussed plans with patient for ongoing care management follow up and provided patient with direct contact information for care management team; Reviewed scheduled/upcoming provider appointments including:  Screening for signs and symptoms of depression related to chronic disease state;  Assessed social determinant of health barriers;  Patient reports she was not taking lisinopril since hospital discharge on 11/09/2021.  Patient thought she was told to stop lisinopril.  It is clear on hospital discharge orders that patient is to take lisinopril daily.  I instructed patient to restart taking Lisinopril daily as ordered.  Patient verbalized understanding. Patient states she has difficulty exercising due to her fear of going outside which interferes with patient exercising such as walking.  Patient Goals/Self-Care Activities: Take medications as prescribed   Call pharmacy for medication refills 3-7 days in advance of running out of medications Perform all self care activities independently  Perform IADL's (shopping, preparing meals, housekeeping, managing finances) independently Call provider office for new concerns or questions  call office if I gain more than 2 pounds in one day or 5 pounds in one week watch for swelling in feet, ankles and legs every day weigh myself daily keep all doctor appointments take medications for blood pressure exactly as prescribed Work with RN Care Manager to obtain a blood pressure monitor via [employer].  Follow Up:  Patient agrees to Care Plan and Follow-up.  Plan: The Managed Medicaid care management team will reach out to the patient again over the next 30 days.  Date/time of next scheduled RN care management/care coordination outreach:  December 23, 2021 at 9:00 am  [place], [place] [facility] Mobile: [phone]

## 2021-11-24 ENCOUNTER — Other Ambulatory Visit (HOSPITAL_COMMUNITY): Payer: Self-pay | Admitting: Emergency Medicine

## 2021-11-24 NOTE — Progress Notes (Signed)
Paramedicine Encounter    Patient ID: Sarah Reilly, female    DOB: 1981/05/08, 41 y.o.   MRN: [id number]  Today was my first visit with Sarah Reilly.  We had our meeting on the front porch of her apartment.  She advised she didn't want to invite me in because she has not furniture due to mold in her apartment. She states she does have beds and has kept the matresses wrapped in plastic to protect the from mold.  She reports to be feeling good today but she just gets tire easily which she says is normal for her.  Her blood pressure is elevated today but she missed her morning dose today.  I palpated her BP on her forearm and got 130/palpated.  I utilized the large automated cuff on my monitor on her upper arm and got a reading of 140/100.  Sarah Reilly has an appointment at [facility] w/ Dr. Margarita Rana  6/15 which she said she was going to cancel.  I called [facility]  to see if there was something else available with another doctor but nothing else available till September.  I talked to her about just keeping he appointment on the 15th and let them know that for future appointments she would like a different doctor. Gave pt a list of housing resources to call.  I will meet with her again next week 12/01/21 @ 1:30.  Also pt states she's been experiencing blurry vision and she stated that [facility] said it could possibly be coming from her Digoxin med.    BP (!) (P) 140/100 (BP Location: Left Arm, Patient Position: Sitting, Cuff Size: Large)   Pulse (P) 96   Resp (P) 16   Wt 209 lb 3.2 oz (94.9 kg)   SpO2 (P) 98%   BMI 32.77 kg/m  Weight yesterday-209.4lbs Last visit weight-207lb  Patient Care Team: [name], MD as PCP - General (Family Medicine) Minus Breeding, MD as PCP - Cardiology (Cardiology) [name], [name], MD as PCP - Advanced Heart Failure (Cardiology) Inge Rise, RN as Case Manager  Patient Active Problem List   Diagnosis Date Noted   Acute on chronic systolic heart  failure ([place]) 11/05/2021   Educated about COVID-19 virus infection [id number]   Acute systolic HF (heart failure) ([place]) 01/15/2019   Hyperthyroidism 01/15/2019   Morbid obesity ([place]) 01/01/2019   Sinus tachycardia 01/01/2019   Family history of diabetes mellitus 01/01/2019   Medication management 11/05/2017   Other fatigue 11/05/2017   Cardiomyopathy, peripartum, 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   SOB (shortness of breath) 11/03/2017   Severe mitral regurgitation 07/20/2009   CHEST PAIN 07/20/2009   Essential hypertension 06/16/2009   AGORAPHOBIA 04/22/2009   MIGRAINE HEADACHE 04/22/2009   PYELONEPHRITIS 04/22/2009   SYNCOPE 04/22/2009   DIZZINESS 04/22/2009   PALPITATIONS 04/22/2009   ELECTROCARDIOGRAM, ABNORMAL 04/22/2009    Current Outpatient Medications:    acetaminophen (TYLENOL) 500 MG tablet, Take 500-1,000 mg by mouth 4 (four) times daily as needed (for pain)., Disp: , Rfl:    carvedilol (COREG) 3.125 MG tablet, Take 1 tablet (3.125 mg total) by mouth 2 (two) times daily with a meal., Disp: 60 tablet, Rfl: 6   digoxin (LANOXIN) 0.125 MG tablet, Take 1 tablet (0.125 mg total) by mouth daily., Disp: 30 tablet, Rfl: 6   lisinopril (ZESTRIL) 20 MG tablet, Take 1 tablet (20 mg total) by mouth daily., Disp: 30 tablet, Rfl: 6   methimazole (TAPAZOLE) 10 MG tablet, Take 2 tablets (20 mg total) by  mouth daily., Disp: 60 tablet, Rfl: 6   predniSONE (DELTASONE) 20 MG tablet, Take 1 tablet (20 mg total) by mouth daily with breakfast., Disp: 30 tablet, Rfl: 6   spironolactone (ALDACTONE) 25 MG tablet, Take 1 tablet (25 mg total) by mouth daily., Disp: 30 tablet, Rfl: 6   torsemide (DEMADEX) 20 MG tablet, Take 2 tablets (40 mg total) by mouth daily., Disp: 120 tablet, Rfl: 6   potassium chloride SA (KLOR-CON M) 20 MEQ tablet, Take 1 tablet (20 mEq total) by mouth daily. (Patient taking differently: Take 40 mEq by mouth daily.), Disp: 90 tablet, Rfl: 3 Allergies  Allergen Reactions   Celery Oil  Anaphylaxis   Shellfish Allergy Anaphylaxis   Entresto [Sacubitril-Valsartan] Other (See Comments)    GI upset   Hydrocodone Other (See Comments)    Reaction happened in the spine   Latex Rash and Other (See Comments)    EKG leads cause rashes- cannot tolerate for any extended length of time      Social History   Socioeconomic History   Marital status: Single    Spouse name: Not on file   Number of children: Not on file   Years of education: Not on file   Highest education level: Not on file  Occupational History   Not on file  Tobacco Use   Smoking status: Former   Smokeless tobacco: Never  Substance and Sexual Activity   Alcohol use: Yes    Alcohol/week: 4.0 standard drinks    Types: 3 Glasses of wine, 1 Standard drinks or equivalent per week    Comment: Occassional   Drug use: Never   Sexual activity: Not on file  Other Topics Concern   Not on file  Social History Narrative   She has 4 children ages 22 and under.  She stays at home. She does not smoke cigarettes or drink alcohol. Does not get regular exercise.    Social Determinants of Health   Financial Resource Strain: High Risk   Difficulty of Paying Living Expenses: Hard  Food Insecurity: Food Insecurity Present   Worried About Charity fundraiser in the Last Year: Sometimes true   Arboriculturist in the Last Year: Sometimes true  Transportation Needs: Unmet Transportation Needs   Lack of Transportation (Medical): Yes   Lack of Transportation (Non-Medical): Yes  Physical Activity: Inactive   Days of Exercise per Week: 0 days   Minutes of Exercise per Session: 0 min  Stress: Stress Concern Present   Feeling of Stress : To some extent  Social Connections: Unknown   Frequency of Communication with Friends and Family: More than three times a week   Frequency of Social Gatherings with Friends and Family: More than three times a week   Attends Religious Services: Never   Marine scientist or  Organizations: No   Attends Archivist Meetings: Never   Marital Status: Not on file  Intimate Partner Violence: Not on file    Physical Exam      Future Appointments  Date Time Provider [place]  12/09/2021  9:30 AM [name], MD [facility] None  12/21/2021  2:30 PM Brita Romp, NP [facility] None  12/23/2021  9:00 AM [facility] CARE MANAGER 3 [facility] None  01/07/2022  2:00 PM [name], [name], MD [place] None       Renee Ramus, [place] Paramedic  11/24/21

## 2021-12-01 ENCOUNTER — Other Ambulatory Visit (HOSPITAL_COMMUNITY): Payer: Self-pay | Admitting: Emergency Medicine

## 2021-12-01 NOTE — Progress Notes (Signed)
Paramedicine Encounter    Patient ID: Sarah Reilly, female    DOB: 1980-10-18, 41 y.o.   MRN: [id number]   Met with Sarah Reilly.  Met in my vehicle as she reports she has no furniture inside.  Pt reports no chest pain or SOB. She reports she has been having "palpitations".  She says they occur mostly when she is laying down.  She says, "my heart feels like its beating really hard."  She did advised that she has had a lot of anxiety and stress recently as her grandfather passed away and she was unable to go to the funeral because it was in [place].  No edema to her lower extremities.   Lung sounds clear bilat.  Pulse palpates strong and regular during my assessment.  She does her own pill box and advises she takes all medications as directed.  She was able to tell me dosages and when she takes each med.  She says she was able to fill out her food stamp application online and she has already started receiving them.  She also is discussing getting a place to live with her mother and children still in [place] "I want to live out in the country [place] or [place]. Next visit scheduled 6/14 @ 12:00.  Wt 211 lb (95.7 kg)   BMI 33.05 kg/m  Weight yesterday-211 Last visit weight-209    Patient Care Team: [name], MD as PCP - General (Family Medicine) Minus Breeding, MD as PCP - Cardiology (Cardiology) [name], [name], MD as PCP - Advanced Heart Failure (Cardiology) Inge Rise, RN as Case Manager  Patient Active Problem List   Diagnosis Date Noted   Acute on chronic systolic heart failure ([place]) 11/05/2021   Educated about COVID-19 virus infection 96/78/9381   Acute systolic HF (heart failure) ([place]) 01/15/2019   Hyperthyroidism 01/15/2019   Morbid obesity ([place]) 01/01/2019   Sinus tachycardia 01/01/2019   Family history of diabetes mellitus 01/01/2019   Medication management 11/05/2017   Other fatigue 11/05/2017   Cardiomyopathy, peripartum,  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   SOB (shortness of breath) 11/03/2017   Severe mitral regurgitation 07/20/2009   CHEST PAIN 07/20/2009   Essential hypertension 06/16/2009   AGORAPHOBIA 04/22/2009   MIGRAINE HEADACHE 04/22/2009   PYELONEPHRITIS 04/22/2009   SYNCOPE 04/22/2009   DIZZINESS 04/22/2009   PALPITATIONS 04/22/2009   ELECTROCARDIOGRAM, ABNORMAL 04/22/2009    Current Outpatient Medications:    acetaminophen (TYLENOL) 500 MG tablet, Take 500-1,000 mg by mouth 4 (four) times daily as needed (for pain)., Disp: , Rfl:    carvedilol (COREG) 3.125 MG tablet, Take 1 tablet (3.125 mg total) by mouth 2 (two) times daily with a meal., Disp: 60 tablet, Rfl: 6   digoxin (LANOXIN) 0.125 MG tablet, Take 1 tablet (0.125 mg total) by mouth daily., Disp: 30 tablet, Rfl: 6   lisinopril (ZESTRIL) 20 MG tablet, Take 1 tablet (20 mg total) by mouth daily., Disp: 30 tablet, Rfl: 6   methimazole (TAPAZOLE) 10 MG tablet, Take 2 tablets (20 mg total) by mouth daily., Disp: 60 tablet, Rfl: 6   potassium chloride SA (KLOR-CON M) 20 MEQ tablet, Take 1 tablet (20 mEq total) by mouth daily. (Patient taking differently: Take 40 mEq by mouth daily.), Disp: 90 tablet, Rfl: 3   predniSONE (DELTASONE) 20 MG tablet, Take 1 tablet (20 mg total) by mouth daily with breakfast., Disp: 30 tablet, Rfl: 6   spironolactone (ALDACTONE) 25 MG tablet, Take 1 tablet (25 mg total)  by mouth daily., Disp: 30 tablet, Rfl: 6   torsemide (DEMADEX) 20 MG tablet, Take 2 tablets (40 mg total) by mouth daily., Disp: 120 tablet, Rfl: 6 Allergies  Allergen Reactions   Celery Oil Anaphylaxis   Shellfish Allergy Anaphylaxis   Entresto [Sacubitril-Valsartan] Other (See Comments)    GI upset   Hydrocodone Other (See Comments)    Reaction happened in the spine   Latex Rash and Other (See Comments)    EKG leads cause rashes- cannot tolerate for any extended length of time      Social History   Socioeconomic History   Marital status: Single     Spouse name: Not on file   Number of children: Not on file   Years of education: Not on file   Highest education level: Not on file  Occupational History   Not on file  Tobacco Use   Smoking status: Former   Smokeless tobacco: Never  Substance and Sexual Activity   Alcohol use: Yes    Alcohol/week: 4.0 standard drinks    Types: 3 Glasses of wine, 1 Standard drinks or equivalent per week    Comment: Occassional   Drug use: Never   Sexual activity: Not on file  Other Topics Concern   Not on file  Social History Narrative   She has 4 children ages 33 and under.  She stays at home. She does not smoke cigarettes or drink alcohol. Does not get regular exercise.    Social Determinants of Health   Financial Resource Strain: High Risk   Difficulty of Paying Living Expenses: Hard  Food Insecurity: Food Insecurity Present   Worried About Charity fundraiser in the Last Year: Sometimes true   Arboriculturist in the Last Year: Sometimes true  Transportation Needs: Unmet Transportation Needs   Lack of Transportation (Medical): Yes   Lack of Transportation (Non-Medical): Yes  Physical Activity: Inactive   Days of Exercise per Week: 0 days   Minutes of Exercise per Session: 0 min  Stress: Stress Concern Present   Feeling of Stress : To some extent  Social Connections: Unknown   Frequency of Communication with Friends and Family: More than three times a week   Frequency of Social Gatherings with Friends and Family: More than three times a week   Attends Religious Services: Never   Marine scientist or Organizations: No   Attends Archivist Meetings: Never   Marital Status: Not on file  Intimate Partner Violence: Not on file    Physical Exam      Future Appointments  Date Time Provider [place]  12/09/2021  9:30 AM [name], MD [facility] None  12/21/2021  2:30 PM Brita Romp, NP [facility] None  12/23/2021  9:00 AM [facility] CARE MANAGER 3  [facility] None  01/07/2022  2:00 PM [name], [name], MD [place] None       Renee Ramus, [place] [facility] Paramedic  12/01/21

## 2021-12-08 ENCOUNTER — Other Ambulatory Visit (HOSPITAL_COMMUNITY): Payer: Self-pay | Admitting: Emergency Medicine

## 2021-12-08 NOTE — Progress Notes (Signed)
Paramedicine Encounter    Patient ID: Sarah Reilly, female    DOB: 04-23-1981, 41 y.o.   MRN: [id number]   There were no vitals taken for this visit. Weight yesterday-213LB Last visit weight-211lbs  ATF Sarah Reilly CA&O x 4, skin warm and dry w/ good color.  Pt has no chest pain or SOB.  Lung sounds clear and equal bilat.  No edema noted  She is compliant with her medications.  She does tell me of an epidsode she had at the grocery store on 6/9.  She stated this is the first time she's been out and about since she got out of the hospital.  She describes, shortness of breath, sweating and fatigued.  She states she thinks the trip was just too much for her.  Advised her to monitor this type incident and to certainly call [id number] should she feel she needs immediate assistance.  Today she exhibits no distress but just says she is fatigued.  Discussed doing starting visits every 2 weeks now and she advised me she would call me should she have needs.  Next visit 6/28 @ 12:00.   Patient Care Team: [name], MD as PCP - General (Family Medicine) Minus Breeding, MD as PCP - Cardiology (Cardiology) [name], [name], MD as PCP - Advanced Heart Failure (Cardiology) Inge Rise, RN as Case Manager  Patient Active Problem List   Diagnosis Date Noted   Acute on chronic systolic heart failure ([place]) 11/05/2021   Educated about COVID-19 virus infection [id number]   Acute systolic HF (heart failure) ([place]) 01/15/2019   Hyperthyroidism 01/15/2019   Morbid obesity ([place]) 01/01/2019   Sinus tachycardia 01/01/2019   Family history of diabetes mellitus 01/01/2019   Medication management 11/05/2017   Other fatigue 11/05/2017   Cardiomyopathy, peripartum, 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   SOB (shortness of breath) 11/03/2017   Severe mitral regurgitation 07/20/2009   CHEST PAIN 07/20/2009   Essential hypertension 06/16/2009   AGORAPHOBIA 04/22/2009   MIGRAINE HEADACHE 04/22/2009   PYELONEPHRITIS  04/22/2009   SYNCOPE 04/22/2009   DIZZINESS 04/22/2009   PALPITATIONS 04/22/2009   ELECTROCARDIOGRAM, ABNORMAL 04/22/2009    Current Outpatient Medications:    acetaminophen (TYLENOL) 500 MG tablet, Take 500-1,000 mg by mouth 4 (four) times daily as needed (for pain)., Disp: , Rfl:    carvedilol (COREG) 3.125 MG tablet, Take 1 tablet (3.125 mg total) by mouth 2 (two) times daily with a meal., Disp: 60 tablet, Rfl: 6   digoxin (LANOXIN) 0.125 MG tablet, Take 1 tablet (0.125 mg total) by mouth daily., Disp: 30 tablet, Rfl: 6   lisinopril (ZESTRIL) 20 MG tablet, Take 1 tablet (20 mg total) by mouth daily., Disp: 30 tablet, Rfl: 6   methimazole (TAPAZOLE) 10 MG tablet, Take 2 tablets (20 mg total) by mouth daily., Disp: 60 tablet, Rfl: 6   potassium chloride SA (KLOR-CON M) 20 MEQ tablet, Take 1 tablet (20 mEq total) by mouth daily. (Patient taking differently: Take 40 mEq by mouth daily.), Disp: 90 tablet, Rfl: 3   predniSONE (DELTASONE) 20 MG tablet, Take 1 tablet (20 mg total) by mouth daily with breakfast., Disp: 30 tablet, Rfl: 6   spironolactone (ALDACTONE) 25 MG tablet, Take 1 tablet (25 mg total) by mouth daily., Disp: 30 tablet, Rfl: 6   torsemide (DEMADEX) 20 MG tablet, Take 2 tablets (40 mg total) by mouth daily., Disp: 120 tablet, Rfl: 6 Allergies  Allergen Reactions   Celery Oil Anaphylaxis   Shellfish Allergy Anaphylaxis   Entresto [  Sacubitril-Valsartan] Other (See Comments)    GI upset   Hydrocodone Other (See Comments)    Reaction happened in the spine   Latex Rash and Other (See Comments)    EKG leads cause rashes- cannot tolerate for any extended length of time      Social History   Socioeconomic History   Marital status: Single    Spouse name: Not on file   Number of children: Not on file   Years of education: Not on file   Highest education level: Not on file  Occupational History   Not on file  Tobacco Use   Smoking status: Former   Smokeless tobacco: Never   Substance and Sexual Activity   Alcohol use: Yes    Alcohol/week: 4.0 standard drinks of alcohol    Types: 3 Glasses of wine, 1 Standard drinks or equivalent per week    Comment: Occassional   Drug use: Never   Sexual activity: Not on file  Other Topics Concern   Not on file  Social History Narrative   She has 4 children ages 23 and under.  She stays at home. She does not smoke cigarettes or drink alcohol. Does not get regular exercise.    Social Determinants of Health   Financial Resource Strain: High Risk (11/23/2021)   Overall Financial Resource Strain (CARDIA)    Difficulty of Paying Living Expenses: Hard  Food Insecurity: Food Insecurity Present (11/23/2021)   Hunger Vital Sign    Worried About Running Out of Food in the Last Year: Sometimes true    Ran Out of Food in the Last Year: Sometimes true  Transportation Needs: Unmet Transportation Needs (11/23/2021)   PRAPARE - Hydrologist (Medical): Yes    Lack of Transportation (Non-Medical): Yes  Physical Activity: Inactive (11/23/2021)   Exercise Vital Sign    Days of Exercise per Week: 0 days    Minutes of Exercise per Session: 0 min  Stress: Stress Concern Present (11/23/2021)   [place]    Feeling of Stress : To some extent  Social Connections: Unknown (11/23/2021)   Social Connection and Isolation Panel [NHANES]    Frequency of Communication with Friends and Family: More than three times a week    Frequency of Social Gatherings with Friends and Family: More than three times a week    Attends Religious Services: Never    Marine scientist or Organizations: No    Attends Archivist Meetings: Never    Marital Status: Not on file  Intimate Partner Violence: Not on file    Physical Exam      Future Appointments  Date Time Provider [place]  12/09/2021  9:30 AM [name], MD [facility] None   12/21/2021  2:30 PM Brita Romp, NP [facility] None  12/23/2021  9:00 AM [facility] CARE MANAGER 3 [facility] None  01/07/2022  2:00 PM [name], [name], MD [place] None       Renee Ramus, [place] [facility] Paramedic  12/08/21

## 2021-12-09 ENCOUNTER — Encounter: Payer: Self-pay | Admitting: Family Medicine

## 2021-12-09 ENCOUNTER — Ambulatory Visit: Payer: Medicaid Other | Attending: Family Medicine | Admitting: Family Medicine

## 2021-12-09 VITALS — BP 137/79 | HR 87 | Temp 98.3°F | Ht 67.0 in | Wt 223.0 lb

## 2021-12-09 DIAGNOSIS — R7989 Other specified abnormal findings of blood chemistry: Secondary | ICD-10-CM | POA: Diagnosis not present

## 2021-12-09 DIAGNOSIS — Z1159 Encounter for screening for other viral diseases: Secondary | ICD-10-CM

## 2021-12-09 DIAGNOSIS — R109 Unspecified abdominal pain: Secondary | ICD-10-CM | POA: Diagnosis not present

## 2021-12-09 DIAGNOSIS — E0590 Thyrotoxicosis, unspecified without thyrotoxic crisis or storm: Secondary | ICD-10-CM

## 2021-12-09 DIAGNOSIS — I110 Hypertensive heart disease with heart failure: Secondary | ICD-10-CM

## 2021-12-09 DIAGNOSIS — Z92241 Personal history of systemic steroid therapy: Secondary | ICD-10-CM | POA: Diagnosis not present

## 2021-12-09 DIAGNOSIS — I5042 Chronic combined systolic (congestive) and diastolic (congestive) heart failure: Secondary | ICD-10-CM | POA: Diagnosis not present

## 2021-12-09 LAB — POCT URINALYSIS DIP (CLINITEK)
Bilirubin, UA: NEGATIVE
Blood, UA: NEGATIVE
Glucose, UA: NEGATIVE mg/dL
Ketones, POC UA: NEGATIVE mg/dL
Leukocytes, UA: NEGATIVE
Nitrite, UA: NEGATIVE
POC PROTEIN,UA: NEGATIVE
Spec Grav, UA: >=1.03 — AB (ref 1.010–1.025)
Urobilinogen, UA: 0.2 E.U./dL
pH, UA: 6 (ref 5.0–8.0)

## 2021-12-09 MED ORDER — PREDNISONE 10 MG PO TABS: ORAL_TABLET | ORAL | 0 refills | Status: AC

## 2021-12-09 MED ORDER — OMEPRAZOLE 20 MG PO CPDR: 20.0000 mg | DELAYED_RELEASE_CAPSULE | Freq: Every day | ORAL | 1 refills | Status: DC

## 2021-12-09 MED ORDER — CARVEDILOL 3.125 MG PO TABS: 3.1250 mg | ORAL_TABLET | Freq: Two times a day (BID) | ORAL | 3 refills | Status: DC

## 2021-12-09 MED ORDER — SPIRONOLACTONE 25 MG PO TABS: 25.0000 mg | ORAL_TABLET | Freq: Every day | ORAL | 3 refills | Status: DC

## 2021-12-09 MED ORDER — TORSEMIDE 20 MG PO TABS: 40.0000 mg | ORAL_TABLET | Freq: Every day | ORAL | 3 refills | Status: DC

## 2021-12-09 MED ORDER — DIGOXIN 125 MCG PO TABS: 0.1250 mg | ORAL_TABLET | Freq: Every day | ORAL | 6 refills | Status: DC

## 2021-12-09 MED ORDER — LISINOPRIL 20 MG PO TABS: 20.0000 mg | ORAL_TABLET | Freq: Every day | ORAL | 3 refills | Status: DC

## 2021-12-09 NOTE — Progress Notes (Signed)
Subjective:  Patient ID: Sarah Reilly, female    DOB: January 29, 1981  Age: 41 y.o. MRN: [id number]  CC: Hospitalization Follow-up   HPI Sarah Reilly is a 41 y.o. year old female with a history of NICM(EF 25 to 30% %, left ventricular diffuse hypokinesis, LV severely decreased function from echo of 10/2021), hyperthyroidism, mitral valve regurgitation. Hospitalized 11/05/2021 through 11/09/2021 for acute on chronic systolic heart failure.   Interval History: Since discharge she has had a follow-up visit with the CHF clinic.  Denies presence of pedal edema, dyspnea but states she needs refills on her chronic medication. She has ongoing chest pain which is intermittent and she has had that over the years and does not take Nitroglycerine. At discharge she was placed on prednisone 20 mg by cardiology and she has been adherent with it. She complains of abdominal discomfort and excessive gassiness and is unsure if this is related to the 2 new medications prednisone and digoxin which she was recently prescribed during hospitalization.  Denies presence of reflux symptoms or epigastric pain.   Her hypothyroidism is not being managed by endocrine as she was discharged from [facility] endocrine.  She informs me she has an upcoming appointment in 2 weeks with endocrine in [place]. She has had pyelonephritis in the past no recent has been feeling some discomfort in her bilateral flank.  She would like to be checked for UTI. Past Medical History:  Diagnosis Date   Cardiomyopathy    CHF (congestive heart failure) (HCC)    HTN (hypertension)    Mitral regurgitation     No past surgical history on file.  Family History  Problem Relation Age of Onset   Heart disease Mother        No details   Hypertension Mother    Diabetes Mother    Hypertension Father    Heart murmur Sister     Social History   Socioeconomic History   Marital status: Single    Spouse name: Not on file   Number of  children: Not on file   Years of education: Not on file   Highest education level: Not on file  Occupational History   Not on file  Tobacco Use   Smoking status: Former   Smokeless tobacco: Never  Substance and Sexual Activity   Alcohol use: Yes    Alcohol/week: 4.0 standard drinks of alcohol    Types: 3 Glasses of wine, 1 Standard drinks or equivalent per week    Comment: Occassional   Drug use: Never   Sexual activity: Not on file  Other Topics Concern   Not on file  Social History Narrative   She has 4 children ages 41 and under.  She stays at home. She does not smoke cigarettes or drink alcohol. Does not get regular exercise.    Social Determinants of Health   Financial Resource Strain: High Risk (11/23/2021)   Overall Financial Resource Strain (CARDIA)    Difficulty of Paying Living Expenses: Hard  Food Insecurity: Food Insecurity Present (11/23/2021)   Hunger Vital Sign    Worried About Running Out of Food in the Last Year: Sometimes true    Ran Out of Food in the Last Year: Sometimes true  Transportation Needs: Unmet Transportation Needs (11/23/2021)   PRAPARE - Hydrologist (Medical): Yes    Lack of Transportation (Non-Medical): Yes  Physical Activity: Inactive (11/23/2021)   Exercise Vital Sign    Days of Exercise per  Week: 0 days    Minutes of Exercise per Session: 0 min  Stress: Stress Concern Present (11/23/2021)   [place]    Feeling of Stress : To some extent  Social Connections: Unknown (11/23/2021)   Social Connection and Isolation Panel [NHANES]    Frequency of Communication with Friends and Family: More than three times a week    Frequency of Social Gatherings with Friends and Family: More than three times a week    Attends Religious Services: Never    Marine scientist or Organizations: No    Attends Music therapist: Never    Marital Status:  Not on file    Allergies  Allergen Reactions   Celery Oil Anaphylaxis   Shellfish Allergy Anaphylaxis   Entresto [Sacubitril-Valsartan] Other (See Comments)    GI upset   Hydrocodone Other (See Comments)    Reaction happened in the spine   Latex Rash and Other (See Comments)    EKG leads cause rashes- cannot tolerate for any extended length of time    Outpatient Medications Prior to Visit  Medication Sig Dispense Refill   acetaminophen (TYLENOL) 500 MG tablet Take 500-1,000 mg by mouth 4 (four) times daily as needed (for pain).     methimazole (TAPAZOLE) 10 MG tablet Take 2 tablets (20 mg total) by mouth daily. 60 tablet 6   potassium chloride SA (KLOR-CON M) 20 MEQ tablet Take 1 tablet (20 mEq total) by mouth daily. (Patient taking differently: Take 40 mEq by mouth daily.) 90 tablet 3   carvedilol (COREG) 3.125 MG tablet Take 1 tablet (3.125 mg total) by mouth 2 (two) times daily with a meal. 60 tablet 6   digoxin (LANOXIN) 0.125 MG tablet Take 1 tablet (0.125 mg total) by mouth daily. 30 tablet 6   lisinopril (ZESTRIL) 20 MG tablet Take 1 tablet (20 mg total) by mouth daily. 30 tablet 6   predniSONE (DELTASONE) 20 MG tablet Take 1 tablet (20 mg total) by mouth daily with breakfast. 30 tablet 6   spironolactone (ALDACTONE) 25 MG tablet Take 1 tablet (25 mg total) by mouth daily. 30 tablet 6   torsemide (DEMADEX) 20 MG tablet Take 2 tablets (40 mg total) by mouth daily. 120 tablet 6   No facility-administered medications prior to visit.     ROS Review of Systems  Constitutional:  Negative for activity change, appetite change and fatigue.  HENT:  Negative for congestion, sinus pressure and sore throat.   Eyes:  Negative for visual disturbance.  Respiratory:  Negative for cough, chest tightness, shortness of breath and wheezing.   Cardiovascular:  Negative for chest pain and palpitations.  Gastrointestinal:  Negative for abdominal distention, abdominal pain and constipation.   Endocrine: Negative for polydipsia.  Genitourinary:  Negative for dysuria and frequency.  Musculoskeletal:  Negative for arthralgias and back pain.  Skin:  Negative for rash.  Neurological:  Negative for tremors, light-headedness and numbness.  Hematological:  Does not bruise/bleed easily.  Psychiatric/Behavioral:  Negative for agitation and behavioral problems.     Objective:  BP 137/79   Pulse 87   Temp 98.3 F (36.8 C) (Oral)   Ht _0  (1.702 m)   Wt 223 lb (101.2 kg)   SpO2 98%   BMI 34.93 kg/m      12/09/2021    9:49 AM 12/08/2021   12:07 PM 12/08/2021   11:57 AM  BP/Weight  Systolic BP 010 932 355  Diastolic BP 79 70 0  Wt. (Lbs) 223  213.2  BMI 34.93 kg/m2  33.39 kg/m2      Physical Exam Constitutional:      Appearance: She is well-developed. She is obese.  Cardiovascular:     Rate and Rhythm: Normal rate.     Heart sounds: Murmur heard.  Pulmonary:     Effort: Pulmonary effort is normal.     Breath sounds: Normal breath sounds. No wheezing or rales.  Chest:     Chest wall: No tenderness.  Abdominal:     General: Bowel sounds are normal. There is no distension.     Palpations: Abdomen is soft. There is no mass.     Tenderness: There is no abdominal tenderness.  Musculoskeletal:        General: Normal range of motion.     Right lower leg: No edema.     Left lower leg: No edema.  Neurological:     Mental Status: She is alert and oriented to person, place, and time.  Psychiatric:        Mood and Affect: Mood normal.        Latest Ref Rng & Units 11/18/2021    3:36 PM 11/09/2021    5:30 AM 11/08/2021    5:00 AM  CMP  Glucose 70 - 99 mg/dL 143  95  124   BUN 6 - 20 mg/dL _0 Creatinine 0.44 - 1.00 mg/dL 0.84  0.65  0.79   Sodium 135 - 145 mmol/L 132  135  136   Potassium 3.5 - 5.1 mmol/L 4.5  4.1  3.9   Chloride 98 - 111 mmol/L 101  103  103   CO2 22 - 32 mmol/L _1 Calcium 8.9 - 10.3 mg/dL 9.7  9.3  9.5   Total Protein 6.5 -  8.1 g/dL 8.3     Total Bilirubin 0.3 - 1.2 mg/dL 2.0     Alkaline Phos 38 - 126 U/L 82     AST 15 - 41 U/L 49     ALT 0 - 44 U/L 63       Lipid Panel  No results found for: "CHOL", "TRIG", "HDL", "CHOLHDL", "VLDL", "LDLCALC", "LDLDIRECT"  CBC    Component Value Date/Time   WBC 4.5 10/29/2021 0907   WBC 4.8 06/04/2020 1116   RBC 5.56 (H) 10/29/2021 0907   RBC 5.57 (H) 06/04/2020 1116   HGB 14.0 10/29/2021 0907   HCT 43.1 10/29/2021 0907   PLT 205 10/29/2021 0907   MCV 78 (L) 10/29/2021 0907   MCH 25.2 (L) 10/29/2021 0907   MCH 25.5 (L) 06/04/2020 1116   MCHC 32.5 10/29/2021 0907   MCHC 31.1 06/04/2020 1116   RDW 13.6 10/29/2021 0907   LYMPHSABS 2.0 10/29/2021 0907   EOSABS 0.1 10/29/2021 0907   BASOSABS 0.1 10/29/2021 3151    Lab Results  Component Value Date   HGBA1C 5.0 01/01/2019   Lab Results  Component Value Date   TSH <0.010 (L) 11/05/2021     Assessment & Plan:  1. Hypertensive heart disease with chronic combined systolic and diastolic congestive heart failure (HCC) EF 25 to 30% Euvolemic Follow-up with CHF clinic - lisinopril (ZESTRIL) 20 MG tablet; Take 1 tablet (20 mg total) by mouth daily.  Dispense: 30 tablet; Refill: 3 - carvedilol (COREG) 3.125 MG tablet; Take 1 tablet (3.125 mg total) by mouth 2 (two) times daily with a  meal.  Dispense: 60 tablet; Refill: 3 - digoxin (LANOXIN) 0.125 MG tablet; Take 1 tablet (0.125 mg total) by mouth daily.  Dispense: 30 tablet; Refill: 6 - spironolactone (ALDACTONE) 25 MG tablet; Take 1 tablet (25 mg total) by mouth daily.  Dispense: 30 tablet; Refill: 3 - torsemide (DEMADEX) 20 MG tablet; Take 2 tablets (40 mg total) by mouth daily.  Dispense: 120 tablet; Refill: 3  2. Abdominal discomfort It is possible that she might have gastritis due to the fact that she has been on prednisone for the last month We will also exclude H. pylori - omeprazole (PRILOSEC) 20 MG capsule; Take 1 capsule (20 mg total) by mouth daily.   Dispense: 30 capsule; Refill: 1 - H. pylori breath test  3. Hyperthyroidism Uncontrolled due to medication nonadherence We will check thyroid panel Advised to keep appointment with endocrine - T4, free - TSH  4. Elevated LFTs - CMP14+EGFR  5. Screening for viral disease - HCV Ab w Reflex to Quant PCR - HIV Antibody (routine testing w rflx)  6. Flank pain UA negative for UTI - POCT URINALYSIS DIP (CLINITEK)  7. Personal history of steroid therapy Currently on 20 mg of prednisone prescribed by cardiology for the last month for ? Hyperthyroidism per notes Per notes she can be tapered of hence I will begin tapering process      Health Care Maintenance: We will address at next visit Meds ordered this encounter  Medications   omeprazole (PRILOSEC) 20 MG capsule    Sig: Take 1 capsule (20 mg total) by mouth daily.    Dispense:  30 capsule    Refill:  1   lisinopril (ZESTRIL) 20 MG tablet    Sig: Take 1 tablet (20 mg total) by mouth daily.    Dispense:  30 tablet    Refill:  3   carvedilol (COREG) 3.125 MG tablet    Sig: Take 1 tablet (3.125 mg total) by mouth 2 (two) times daily with a meal.    Dispense:  60 tablet    Refill:  3   digoxin (LANOXIN) 0.125 MG tablet    Sig: Take 1 tablet (0.125 mg total) by mouth daily.    Dispense:  30 tablet    Refill:  6   spironolactone (ALDACTONE) 25 MG tablet    Sig: Take 1 tablet (25 mg total) by mouth daily.    Dispense:  30 tablet    Refill:  3   torsemide (DEMADEX) 20 MG tablet    Sig: Take 2 tablets (40 mg total) by mouth daily.    Dispense:  120 tablet    Refill:  3   predniSONE (DELTASONE) 10 MG tablet    Sig: Take 1 tablet (10 mg total) by mouth daily with breakfast for 7 days, THEN 0.5 tablets (5 mg total) daily with breakfast for 7 days.    Dispense:  10.5 tablet    Refill:  0    Follow-up: Return in about 3 months (around 03/11/2022) for Chronic medical conditions.       [name], MD, FAAFP. [facility] [place], [place]   12/09/2021, 2:16 PM

## 2021-12-10 ENCOUNTER — Other Ambulatory Visit: Payer: Self-pay | Admitting: Family Medicine

## 2021-12-10 LAB — CMP14+EGFR
ALT: 25 IU/L (ref 0–32)
AST: 21 IU/L (ref 0–40)
Albumin/Globulin Ratio: 1.3 (ref 1.2–2.2)
Albumin: 4.1 g/dL (ref 3.8–4.8)
Alkaline Phosphatase: 95 IU/L (ref 44–121)
BUN/Creatinine Ratio: 23 (ref 9–23)
BUN: 17 mg/dL (ref 6–24)
Bilirubin Total: 0.6 mg/dL (ref 0.0–1.2)
CO2: 16 mmol/L — ABNORMAL LOW (ref 20–29)
Calcium: 9.5 mg/dL (ref 8.7–10.2)
Chloride: 99 mmol/L (ref 96–106)
Creatinine, Ser: 0.75 mg/dL (ref 0.57–1.00)
Globulin, Total: 3.1 g/dL (ref 1.5–4.5)
Glucose: 101 mg/dL — ABNORMAL HIGH (ref 70–99)
Potassium: 5 mmol/L (ref 3.5–5.2)
Sodium: 134 mmol/L (ref 134–144)
Total Protein: 7.2 g/dL (ref 6.0–8.5)
eGFR: 103 mL/min/{1.73_m2} (ref >59)

## 2021-12-10 LAB — HCV INTERPRETATION

## 2021-12-10 LAB — HIV ANTIBODY (ROUTINE TESTING W REFLEX): HIV Screen 4th Generation wRfx: NONREACTIVE

## 2021-12-10 LAB — T4, FREE: Free T4: 1.14 ng/dL (ref 0.82–1.77)

## 2021-12-10 LAB — TSH: TSH: 0.015 u[IU]/mL — ABNORMAL LOW (ref 0.450–4.500)

## 2021-12-10 LAB — HCV AB W REFLEX TO QUANT PCR: HCV Ab: NONREACTIVE

## 2021-12-10 MED ORDER — METHIMAZOLE 10 MG PO TABS: 10.0000 mg | ORAL_TABLET | Freq: Three times a day (TID) | ORAL | 1 refills | Status: DC

## 2021-12-11 LAB — H. PYLORI BREATH TEST: H pylori Breath Test: NEGATIVE

## 2021-12-15 ENCOUNTER — Telehealth (HOSPITAL_COMMUNITY): Payer: Self-pay | Admitting: Licensed Clinical Social Worker

## 2021-12-15 NOTE — Telephone Encounter (Addendum)
HF Paramedicine Team Based Care Meeting  HF MD- NA  HF NP - Amy Clegg NP-C   [facility] HF Paramedicine  Katie Vicente Males  [facility] admit within the last 30 days for heart failure? no  Medications concerns? Not at this time- she is able to verbalize medications and doses   SDOH - reports no furniture but not interested in getting assistance with this at this time  Eligible for discharge? Moved to visits every two weeks- not sure if we are contributing to her care at this time as she seems to have good handle on her care  Burna Sis, LCSW Clinical Social Worker [facility] Desk#: [phone] Cell#: [phone]

## 2021-12-15 NOTE — Telephone Encounter (Signed)
CSW called pt to discuss coming to [facility] application appt at clinic- unable to reach- left VM requesting return call  Burna Sis, LCSW Clinical Social Worker [facility] Desk#: [phone] Cell#: [phone]

## 2021-12-15 NOTE — Telephone Encounter (Signed)
Pt returned CSW call and confirms she no longer needs help applying for food stamps as she was able to restart benefits herself.  No further needs at this time  Burna Sis, LCSW Clinical Social Worker [facility] Desk#: [phone] Cell#: [phone]

## 2021-12-21 ENCOUNTER — Ambulatory Visit: Payer: Medicaid Other | Admitting: Nurse Practitioner

## 2021-12-22 ENCOUNTER — Other Ambulatory Visit (HOSPITAL_COMMUNITY): Payer: Self-pay

## 2021-12-22 ENCOUNTER — Other Ambulatory Visit (HOSPITAL_COMMUNITY): Payer: Self-pay | Admitting: Emergency Medicine

## 2021-12-22 NOTE — Progress Notes (Signed)
Paramedicine Encounter    Patient ID: Sarah Reilly, female    DOB: June 19, 1981, 41 y.o.   MRN: [id number]   BP 100/68 (BP Location: Right Arm, Patient Position: Sitting, Cuff Size: Normal)   Pulse 100   Resp 16   Wt 215 lb 6.4 oz (97.7 kg)   SpO2 97%   BMI 33.74 kg/m  Weight yesterday-215lb Last visit weight-213lb  Home visit with Ms. Gorton today as usual we did it sitting in my vehicle because she tells me she has no furniture in her apartment and no where for me to sit.  Pt denies chest pain or SOB.  No edema to her lower extremities.  Medications reviewed and she continues to be compliant with all meds.  Advised her that based on her current abilities to manage her heart failure and being stable that we are discharging her from the para-medicine program.  I advised her that she could reach out to me any time with questions or concern.  She was tearful during this conversation but I reassured her I was just a phone call away.     Patient Care Team: [name], MD as PCP - General (Family Medicine) Rollene Rotunda, MD as PCP - Cardiology (Cardiology) [name], [name], MD as PCP - Advanced Heart Failure (Cardiology) Leane Call, RN (Inactive) as Case Manager  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   SOB (shortness of breath) 11/03/2017   Severe mitral regurgitation 07/20/2009   CHEST PAIN 07/20/2009   Essential hypertension 06/16/2009   AGORAPHOBIA 04/22/2009   MIGRAINE HEADACHE 04/22/2009   PYELONEPHRITIS 04/22/2009    SYNCOPE 04/22/2009   DIZZINESS 04/22/2009   PALPITATIONS 04/22/2009   ELECTROCARDIOGRAM, ABNORMAL 04/22/2009    Current Outpatient Medications:    acetaminophen (TYLENOL) 500 MG tablet, Take 500-1,000 mg by mouth 4 (four) times daily as needed (for pain)., Disp: , Rfl:    carvedilol (COREG) 3.125 MG tablet, Take 1 tablet (3.125 mg total) by mouth 2 (two) times daily with a meal., Disp: 60 tablet, Rfl: 3   digoxin (LANOXIN) 0.125 MG tablet, Take 1 tablet (0.125 mg total) by mouth daily., Disp: 30 tablet, Rfl: 6   lisinopril (ZESTRIL) 20 MG tablet, Take 1 tablet (20 mg total) by mouth daily., Disp: 30 tablet, Rfl: 3   methimazole (TAPAZOLE) 10 MG tablet, Take 1 tablet (10 mg total) by mouth 3 (three) times daily., Disp: 90 tablet, Rfl: 1   omeprazole (PRILOSEC) 20 MG capsule, Take 1 capsule (20 mg total) by mouth daily., Disp: 30 capsule, Rfl: 1   potassium chloride SA (KLOR-CON M) 20 MEQ tablet, Take 1 tablet (20 mEq total) by mouth daily. (Patient taking differently: Take 40 mEq by mouth daily.), Disp: 90 tablet, Rfl: 3   predniSONE (DELTASONE) 10 MG tablet, Take 1 tablet (10 mg total) by mouth daily with breakfast for 7 days, THEN 0.5 tablets (5 mg total) daily with breakfast for 7 days., Disp: 10.5 tablet, Rfl: 0   spironolactone (ALDACTONE) 25 MG tablet, Take 1 tablet (25 mg total) by mouth daily., Disp: 30 tablet, Rfl: 3  torsemide (DEMADEX) 20 MG tablet, Take 2 tablets (40 mg total) by mouth daily., Disp: 120 tablet, Rfl: 3 Allergies  Allergen Reactions   Celery Oil Anaphylaxis   Shellfish Allergy Anaphylaxis   Entresto [Sacubitril-Valsartan] Other (See Comments)    GI upset   Hydrocodone Other (See Comments)    Reaction happened in the spine   Latex Rash and Other (See Comments)    EKG leads cause rashes- cannot tolerate for any extended length of time      Social History   Socioeconomic History   Marital status: Single    Spouse name: Not on file   Number of children: Not  on file   Years of education: Not on file   Highest education level: Not on file  Occupational History   Not on file  Tobacco Use   Smoking status: Former   Smokeless tobacco: Never  Substance and Sexual Activity   Alcohol use: Yes    Alcohol/week: 4.0 standard drinks of alcohol    Types: 3 Glasses of wine, 1 Standard drinks or equivalent per week    Comment: Occassional   Drug use: Never   Sexual activity: Not on file  Other Topics Concern   Not on file  Social History Narrative   She has 4 children ages 85 and under.  She stays at home. She does not smoke cigarettes or drink alcohol. Does not get regular exercise.    Social Determinants of Health   Financial Resource Strain: High Risk (11/23/2021)   Overall Financial Resource Strain (CARDIA)    Difficulty of Paying Living Expenses: Hard  Food Insecurity: Food Insecurity Present (11/23/2021)   Hunger Vital Sign    Worried About Running Out of Food in the Last Year: Sometimes true    Ran Out of Food in the Last Year: Sometimes true  Transportation Needs: Unmet Transportation Needs (11/23/2021)   PRAPARE - Administrator, Civil Service (Medical): Yes    Lack of Transportation (Non-Medical): Yes  Physical Activity: Inactive (11/23/2021)   Exercise Vital Sign    Days of Exercise per Week: 0 days    Minutes of Exercise per Session: 0 min  Stress: Stress Concern Present (11/23/2021)   [employer] of Occupational Health - Occupational Stress Questionnaire    Feeling of Stress : To some extent  Social Connections: Unknown (11/23/2021)   Social Connection and Isolation Panel [NHANES]    Frequency of Communication with Friends and Family: More than three times a week    Frequency of Social Gatherings with Friends and Family: More than three times a week    Attends Religious Services: Never    Database administrator or Organizations: No    Attends Banker Meetings: Never    Marital Status: Not on file   Intimate Partner Violence: Not on file    Physical Exam      Future Appointments  Date Time Provider Department Center  01/07/2022  2:00 PM [name], [name], MD [facility] None       Beatrix Shipper, EMT-Paramedic [phone] [facility] Paramedic  12/22/21

## 2021-12-23 ENCOUNTER — Ambulatory Visit: Payer: Self-pay

## 2021-12-23 ENCOUNTER — Other Ambulatory Visit (HOSPITAL_COMMUNITY): Payer: Self-pay

## 2022-01-07 ENCOUNTER — Ambulatory Visit (HOSPITAL_COMMUNITY)
Admission: RE | Admit: 2022-01-07 | Discharge: 2022-01-07 | Disposition: A | Discharge status: Home / Self Care | Payer: Medicaid Other | Source: Ambulatory Visit | Attending: Internal Medicine | Admitting: Internal Medicine

## 2022-01-07 ENCOUNTER — Encounter (HOSPITAL_COMMUNITY): Payer: Self-pay | Admitting: Internal Medicine

## 2022-01-07 VITALS — BP 110/70 | HR 100 | Wt 221.0 lb

## 2022-01-07 DIAGNOSIS — I340 Nonrheumatic mitral (valve) insufficiency: Secondary | ICD-10-CM | POA: Diagnosis not present

## 2022-01-07 DIAGNOSIS — Z79899 Other long term (current) drug therapy: Secondary | ICD-10-CM | POA: Diagnosis not present

## 2022-01-07 DIAGNOSIS — E669 Obesity, unspecified: Secondary | ICD-10-CM | POA: Diagnosis not present

## 2022-01-07 DIAGNOSIS — I428 Other cardiomyopathies: Secondary | ICD-10-CM | POA: Insufficient documentation

## 2022-01-07 DIAGNOSIS — I5042 Chronic combined systolic (congestive) and diastolic (congestive) heart failure: Secondary | ICD-10-CM | POA: Diagnosis not present

## 2022-01-07 DIAGNOSIS — I110 Hypertensive heart disease with heart failure: Secondary | ICD-10-CM | POA: Diagnosis not present

## 2022-01-07 DIAGNOSIS — E0590 Thyrotoxicosis, unspecified without thyrotoxic crisis or storm: Secondary | ICD-10-CM | POA: Diagnosis not present

## 2022-01-07 DIAGNOSIS — I5022 Chronic systolic (congestive) heart failure: Secondary | ICD-10-CM

## 2022-01-07 LAB — BASIC METABOLIC PANEL
Anion gap: 12 (ref 5–15)
BUN: 12 mg/dL (ref 6–20)
CO2: 21 mmol/L — ABNORMAL LOW (ref 22–32)
Calcium: 9.3 mg/dL (ref 8.9–10.3)
Chloride: 102 mmol/L (ref 98–111)
Creatinine, Ser: 0.82 mg/dL (ref 0.44–1.00)
GFR, Estimated: >60 mL/min (ref >60)
Glucose, Bld: 122 mg/dL — ABNORMAL HIGH (ref 70–99)
Potassium: 3.7 mmol/L (ref 3.5–5.1)
Sodium: 135 mmol/L (ref 135–145)

## 2022-01-07 LAB — BRAIN NATRIURETIC PEPTIDE: B Natriuretic Peptide: 20.4 pg/mL (ref 0.0–100.0)

## 2022-01-07 MED ORDER — CARVEDILOL 6.25 MG PO TABS: 6.2500 mg | ORAL_TABLET | Freq: Two times a day (BID) | ORAL | 6 refills | Status: DC

## 2022-01-07 NOTE — Addendum Note (Signed)
Encounter addended by: Noralee Space, RN on: 01/07/2022 2:38 PM  Actions taken: Order list changed, Diagnosis association updated, Clinical Note Signed, Charge Capture section accepted

## 2022-01-07 NOTE — Progress Notes (Signed)
Advanced Heart Failure Clinic Note  Primary Cardiologist: Dr Antoine Poche  Endocrinology: Dr Lucianne Muss PCP: [name], MD HF MD: Dr Gala Romney   HPI: Sarah Reilly is a 41 y.o. 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    She developed HF after the birth of her 4th child in 2010. EF 20%. Did not have a heart cath.  She then moved back to [place] to be by her mother. Apparently EF dropped to 15-20% for a period. They suggested ICD but she refused. She returned to [place].  In July 2019 she was diagnosed with hyperthyroidism. Started on methimazole by Dr Lucianne Muss.   Previously she was having CP in setting of 30 pound weight gain. Dr. Gala Romney suggested R/L cath but she refused saying she wanted to try and lose weight first and see how she felt. We eventually rescheduled but she couldn't get COVID test.   She was seen in [facility] 8/21 and was quite tachycardic. She had not seen Dr. Lucianne Muss since 12/20. Ran out of methimazole refils. 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  TSH < 0.010. She has not followed up with Endo. On the chart there is a dismissal letter from Dr. Lucianne Muss dated 02/27/20.  Had f/u echo 04/02/20 EF 35-40%, RV normal. Mod-severe MR w/ tethering of posterior leaflet.   Echo 02/2021 -EF 30-35% RV mildly. Bi atrial enlargement.    Saw Dr Antoine Poche 11/05/21 with increased shortness of breath, orthopnea, and PND. Admitted with A/C HFrEF. TSH 0.005. Echo EF 25-30%. Diuresed with IV lasix. cMRI showed LVEF 25%, RV reduced, no LGE. No clear etiology. GDMT titrated. No Entresto d/t intolerance, started on ACE. Discharged home, weight 210 lbs.  Here for f/u. Recently discharged from Paramedicine. Says she feels fine. Denies CP or SOB. Edema well controlled. Stopped prednisone because it was upsetting her stomach.  Still in methimazole (managed by Dr. Alvis Lemmings).     Cardiac Studies: - cMRI (5/23): LVEF 25%, RVEF 30%, no LGE, moderate MR and TR  - Echo (5/23): EF 25-30%, severe LV dysfunction with global HK, RV mildly down, mild to moderate MR, moderate TR  Echo 03/09/21 EF 30-35%, RV mildly reduced, severe MR, mild-mod TR  Echo 04/02/20 EF 35-40% Personally reviewed Echo 2/21 LV dilated/spherical 30-35% likely severe MR. RV ok  Echo 12/2018 EF 35-40% Mod-Severe MR, RV normal Echo 11/16/2017 EF 20-25% Severe MR   ROS: All systems negative except as listed in HPI, PMH and Problem List.  SH:  Social History   Socioeconomic History   Marital status: Single    Spouse name: Not on file   Number of children: Not on file   Years of education: Not on file   Highest education level: Not on file  Occupational History   Not on file  Tobacco Use   Smoking status: Former   Smokeless tobacco: Never  Substance and Sexual Activity   Alcohol use: Yes    Alcohol/week: 4.0 standard drinks of alcohol    Types: 3 Glasses of wine, 1 Standard drinks or equivalent per week    Comment: Occassional   Drug use: Never   Sexual activity: Not on file  Other Topics Concern   Not on file  Social History Narrative   She has 4 children ages 20 and under.  She stays at home. She does not  smoke cigarettes or drink alcohol. Does not get regular exercise.    Social Determinants of Health   Financial Resource Strain: High Risk (11/23/2021)   Overall Financial Resource Strain (CARDIA)    Difficulty of Paying Living Expenses: Hard  Food Insecurity: Food Insecurity Present (11/23/2021)   Hunger Vital Sign    Worried About Running Out of Food in the Last Year: Sometimes true    Ran Out of Food in the Last Year: Sometimes true  Transportation Needs: Unmet Transportation Needs (11/23/2021)   PRAPARE - Administrator, Civil Service (Medical): Yes    Lack of Transportation (Non-Medical): Yes  Physical Activity:  Inactive (11/23/2021)   Exercise Vital Sign    Days of Exercise per Week: 0 days    Minutes of Exercise per Session: 0 min  Stress: Stress Concern Present (11/23/2021)   [employer] of Occupational Health - Occupational Stress Questionnaire    Feeling of Stress : To some extent  Social Connections: Unknown (11/23/2021)   Social Connection and Isolation Panel [NHANES]    Frequency of Communication with Friends and Family: More than three times a week    Frequency of Social Gatherings with Friends and Family: More than three times a week    Attends Religious Services: Never    Database administrator or Organizations: No    Attends Engineer, structural: Never    Marital Status: Not on file  Intimate Partner Violence: Not on file    FH:  Family History  Problem Relation Age of Onset   Heart disease Mother        No details   Hypertension Mother    Diabetes Mother    Hypertension Father    Heart murmur Sister    Past Medical History:  Diagnosis Date   Cardiomyopathy    CHF (congestive heart failure) (HCC)    HTN (hypertension)    Mitral regurgitation    Current Outpatient Medications  Medication Sig Dispense Refill   acetaminophen (TYLENOL) 500 MG tablet Take 500-1,000 mg by mouth 4 (four) times daily as needed (for pain).     carvedilol (COREG) 3.125 MG tablet Take 1 tablet (3.125 mg total) by mouth 2 (two) times daily with a meal. 60 tablet 3   digoxin (LANOXIN) 0.125 MG tablet Take 1 tablet (0.125 mg total) by mouth daily. 30 tablet 6   lisinopril (ZESTRIL) 20 MG tablet Take 1 tablet (20 mg total) by mouth daily. 30 tablet 3   methimazole (TAPAZOLE) 10 MG tablet Take 20 mg by mouth daily.     omeprazole (PRILOSEC) 20 MG capsule Take 20 mg by mouth as needed.     potassium chloride SA (KLOR-CON M) 20 MEQ tablet Take 1 tablet (20 mEq total) by mouth daily. 90 tablet 3   spironolactone (ALDACTONE) 25 MG tablet Take 1 tablet (25 mg total) by mouth daily. 30 tablet 3    torsemide (DEMADEX) 20 MG tablet Take 2 tablets (40 mg total) by mouth daily. 120 tablet 3   No current facility-administered medications for this encounter.   BP 110/70   Pulse 100   Wt 100.2 kg (221 lb)   SpO2 98%   BMI 34.61 kg/m   Wt Readings from Last 3 Encounters:  01/07/22 100.2 kg (221 lb)  12/22/21 97.7 kg (215 lb 6.4 oz)  12/09/21 101.2 kg (223 lb)   PHYSICAL EXAM: General:  Well appearing. No resp difficulty HEENT: normal Neck: supple. no JVD. Carotids 2+ bilat;  no bruits. No lymphadenopathy. Mild goiter Cor: PMI nondisplaced. Regular rate & rhythm. No rubs, gallops. 2/6 MR Lungs: clear Abdomen: obese soft, nontender, nondistended. No hepatosplenomegaly. No bruits or masses. Good bowel sounds. Extremities: no cyanosis, clubbing, rash, edema Neuro: alert & orientedx3, cranial nerves grossly intact. moves all 4 extremities w/o difficulty. Affect pleasant   ASSESSMENT & PLAN: Chronic Systolic Heart Failure - Onset in 2010. 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 - Echo 5/19 EF 20-25% with severe posterior MR.   - ECHO 12/2018 EF 35-40% mod-severe MR with restricted posterior leaflet  - Echo 2/21 EF 30-35% mod-severe MR RV ok  - Echo 04/02/20 EF 35-40% moderate to severe MR  - Echo  9/22 LVEF 30-35%, RV mildly reduced, Severe MR, Mild TR  - Echo (5/23): EF 25-30%, severe LV dilation, mildly decreased RV function, severe LAE, moderate-severe MR with thickened mitral valve, moderate TR, IVC dilated.  - cMRI (5/23): LVEF 25% RVEF reduced. No LGE. - Stable NYHA II volume ok.  - Continue lisinopril 20 mg daily (no Entresto due to GI upset). - Continue torsemide 40 mg daily. - Continue spironolactone 25 mg daily.  - Continue digoxin 0.125 mg daily. - Increase carvedilol to 6.25 bid  - No SGLT2i d/t multiple episodes of pyelonephritis in past. Denies recent GU symptoms.  - Not interested in ICD - Labs today   2. Hyperthyroidism   - Previously on methimazole and followed by Dr Dwyane Dee.  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   - She was dismissed from [facility].   - TSH <0.010, free T4 high.  - Contin methimazole 20 mg daily.  - Off prednisone due to stomach upset  - Continue low dose beta blocker.    3. Mitral regurgitation  - Moderate-severe on echo 5/23.  With severe LV and LA dilation, suspect component of functional MR but the mitral valve is also thickened towards the tips.   - She is not inclined to invasive management/evaluation, hopefully MR will improve with medical management.  - repeat echo in 6 months.    Glori Bickers, MD  2:26 PM

## 2022-01-07 NOTE — Patient Instructions (Signed)
Medication Changes:  Increase Carvedilol to 6.25 mg Twice daily   Lab Work:  Labs done today, your results will be available in MyChart, we will contact you for abnormal readings.  Testing/Procedures:  none  Referrals:  none  Special Instructions // Education:  Do the following things EVERYDAY: Weigh yourself in the morning before breakfast. Write it down and keep it in a log. Take your medicines as prescribed Eat low salt foods--Limit salt (sodium) to 2000 mg per day.  Stay as active as you can everyday Limit all fluids for the day to less than 2 liters   Follow-Up in: 4 months  At the Advanced Heart Failure Clinic, you and your health needs are our priority. We have a designated team specialized in the treatment of Heart Failure. This Care Team includes your primary Heart Failure Specialized Cardiologist (physician), Advanced Practice Providers (APPs- Physician Assistants and Nurse Practitioners), and Pharmacist who all work together to provide you with the care you need, when you need it.   You may see any of the following providers on your designated Care Team at your next follow up:  Dr Arvilla Meres Dr Carron Curie, NP Robbie Lis, [facility] [place], [place] Karle Plumber, PharmD   Please be sure to bring in all your medications bottles to every appointment.   Need to Contact us:  If you have any questions or concerns before your next appointment please send [place] a message through [place] or call our office at [phone].    TO LEAVE A MESSAGE FOR THE NURSE SELECT OPTION 2, PLEASE LEAVE A MESSAGE INCLUDING: YOUR NAME DATE OF BIRTH CALL BACK NUMBER REASON FOR CALL**this is important as we prioritize the call backs  YOU WILL RECEIVE A CALL BACK THE SAME DAY AS LONG AS YOU CALL BEFORE 4:00 PM

## 2022-01-11 ENCOUNTER — Other Ambulatory Visit: Payer: Self-pay | Admitting: *Deleted

## 2022-01-11 NOTE — Patient Outreach (Signed)
Medicaid Managed Care   Nurse Care Manager Note  01/11/2022 Name:  Sarah Reilly MRN:  [id number] DOB:  10-06-1980  Sarah Reilly is an 41 y.o. year old female who is a primary patient of Sarah Rakes, MD.  The [facility] Managed Care Coordination team was consulted for assistance with:    CHF HTN Thyroid disorder  Ms. Sarah Reilly was given information about Medicaid Managed Care Coordination team services today. [name] agreed to services and verbal consent obtained.  Engaged with patient by telephone for follow up visit in response to provider referral for case management and/or care coordination services.   Assessments/Interventions:  Review of past medical history, allergies, medications, health status, including review of consultants reports, laboratory and other test data, was performed as part of comprehensive evaluation and provision of chronic care management services.  SDOH (Social Determinants of Health) assessments and interventions performed:   Care Plan  Allergies  Allergen Reactions   Celery Oil Anaphylaxis   Shellfish Allergy Anaphylaxis   Entresto [Sacubitril-Valsartan] Other (See Comments)    GI upset   Hydrocodone Other (See Comments)    Reaction happened in the spine   Latex Rash and Other (See Comments)    EKG leads cause rashes- cannot tolerate for any extended length of time    Medications Reviewed Today     Reviewed by [name], RN (Registered Nurse) on 01/11/22 at [place] List Status: <None>   Medication Order Taking? Sig Documenting Provider Last Dose Status Informant  acetaminophen (TYLENOL) 500 MG tablet [id number] Yes Take 500-1,000 mg by mouth 4 (four) times daily as needed (for pain). [provider] Taking Active Self  carvedilol (COREG) 6.25 MG tablet [id number] Yes Take 1 tablet (6.25 mg total) by mouth 2 (two) times daily with a meal. [name], [name], MD Taking Active   digoxin (LANOXIN) 0.125 MG  tablet [id number] Yes Take 1 tablet (0.125 mg total) by mouth daily. Sarah Rakes, MD Taking Active   lisinopril (ZESTRIL) 20 MG tablet [id number] Yes Take 1 tablet (20 mg total) by mouth daily. Sarah Rakes, MD Taking Active   methimazole (TAPAZOLE) 10 MG tablet [id number] Yes Take 20 mg by mouth daily. [provider] Taking Active   omeprazole (PRILOSEC) 20 MG capsule [id number] Yes Take 20 mg by mouth as needed. [provider] Taking Active   potassium chloride SA (KLOR-CON M) 20 MEQ tablet [id number] Yes Take 1 tablet (20 mEq total) by mouth daily. Minus Breeding, MD Taking Active Self           Med Note [facility] [place]   Fri Jan 07, 2022  1:57 PM)    spironolactone (ALDACTONE) 25 MG tablet [id number] Yes Take 1 tablet (25 mg total) by mouth daily. Sarah Rakes, MD Taking Active   torsemide (DEMADEX) 20 MG tablet [id number] Yes Take 2 tablets (40 mg total) by mouth daily. 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   SOB (shortness of breath) 11/03/2017  Severe mitral regurgitation 07/20/2009   CHEST PAIN 07/20/2009   Essential hypertension 06/16/2009   AGORAPHOBIA 04/22/2009   MIGRAINE HEADACHE 04/22/2009   PYELONEPHRITIS 04/22/2009   SYNCOPE 04/22/2009   DIZZINESS 04/22/2009   PALPITATIONS 04/22/2009   ELECTROCARDIOGRAM, ABNORMAL 04/22/2009    Conditions to be addressed/monitored per PCP order:  CHF, HTN, and thyroid disorder  Care Plan : RN Care Manager Plan of Care   Updates made by [name], RN since 01/11/2022 12:00 AM     Problem: Chronic Disease Management and Care Coordination Needs for CHF, HTN, Hyperthyroidism   Priority: High  Onset Date: 11/23/2021     Long-Range Goal: Development of Plan of Care for Chronic Disease Management and Care Coordination Needs (CHF, HTN, Hyperthyroidism)   Start Date: 11/23/2021  Expected End Date: 03/23/2022  Priority: High  Note:   Current Barriers:  Knowledge Deficits related to plan of care for management of CHF, HTN, and Hyperthyroidism  Chronic Disease Management support and education needs related to CHF, HTN, and Hyperthyroidism Lacks caregiver support.        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 No Advanced Directives in place  RNCM Clinical Goal(s):  Patient will verbalize understanding of plan for management of CHF, HTN, and Hyperthyroidism as evidenced by improved management of these chronic diseases verbalize basic understanding of CHF, HTN, and Hyperthyroidism disease process and self health management plan as evidenced by improved management of these chronic diseases take all medications exactly as prescribed and will call provider for medication related questions as evidenced by being compliant with all prescribed medications    attend all scheduled medical appointments: Working on getting new PCP appointment in place of scheduled appointment with Dr. Margarita Rana on 12/09/2021; 12/21/21 New patient visit with Dr. Rayetta Pigg (Endocrinologist); 01/07/2022 Dr. Haroldine Laws as evidenced by attending all scheduled appointments        demonstrate improved adherence to prescribed treatment plan for CHF, HTN, and Hyperthyroidism as evidenced by improved management of these diseases. continue to work with Consulting civil engineer and/or Social Worker to address care management and care coordination needs related to CHF, HTN, and Hyperthyroidism as evidenced by adherence to CM Team  Scheduled appointments     through collaboration with Consulting civil engineer, provider, and care team.   Interventions: Inter-disciplinary care team collaboration (see longitudinal plan of care) Evaluation of current treatment plan related to  self management and patient's adherence to plan as established by provider   Heart Failure Interventions:  (Status: Goal on Track (progressing): YES.)  Long Term Goal  Wt Readings from Last 3 Encounters:  01/07/22 221 lb (100.2 kg)  12/22/21 215 lb 6.4 oz (97.7 kg)  12/09/21 223 lb (101.2 kg)  Reviewed Heart Failure Action Plan in depth and provided written copy Discussed importance of daily weight and advised patient to weigh and record daily Discussed the importance of keeping all appointments with provider Provided patient with education about the role of exercise in the management of heart failure Assessed social determinant of health barriers Reviewed providers notes, discussed visit with patient Discussed benefits of sleep hygiene, examples provided  Hyperthyroidism  (Status: Goal on Track (progressing): YES.) Long Term Goal  Evaluation of current treatment plan related to  Hyperthyroidism , Financial constraints related to increased rent and having to reapply for Food Stamps (done)  self-management and patient's adherence to plan as established by provider. Discussed plans with patient for ongoing care management follow up and provided patient with direct contact information  for care management team Reviewed medications with patient and discussed importance of medication compliance; Reviewed scheduled/upcoming provider appointments including : See appointments in [place] ; Screening for signs and symptoms of depression related to chronic disease state;  Assessed social determinant of health barriers;  Collaborated with PCP for new referral for Endocrinology in [place]   Hypertension: (Status: Goal on Track (progressing): YES.) Long  Term Goal  Last practice recorded BP readings:  BP Readings from Last 3 Encounters:  01/07/22 110/70  12/22/21 100/68  12/09/21 137/79  Most recent eGFR/CrCl:  Lab Results  Component Value Date   EGFR 103 12/09/2021    No components found for: "CRCL"  Evaluation of current treatment plan related to hypertension self management and patient's adherence to plan as established by provider;   Reviewed prescribed diet low sodium  Reviewed medications with patient and discussed importance of compliance;  Provided assistance with obtaining home blood pressure monitor via PCP order to [place];  Counseled on the importance of exercise goals with target of 150 minutes per week Discussed plans with patient for ongoing care management follow up and provided patient with direct contact information for care management team; Reviewed scheduled/upcoming provider appointments including:  Screening for signs and symptoms of depression related to chronic disease state;  Assessed social determinant of health barriers;  Advised patient to incorporate exercise in her daily routine, examples provided including a 10 minute walk each day and online exercise videos Collaborate with PCP regarding sending BP cuff order to [place]  Patient Goals/Self-Care Activities: Take medications as prescribed   Call pharmacy for medication refills 3-7 days in advance of running out of medications Perform all self care activities independently  Perform IADL's (shopping, preparing meals, housekeeping, managing finances) independently Call provider office for new concerns or questions  call office if I gain more than 2 pounds in one day or 5 pounds in one week watch for swelling in feet, ankles and legs every day weigh myself daily keep all doctor appointments take medications for blood pressure exactly as prescribed Work with RN Care Manager to obtain a blood pressure monitor via [employer].        Follow Up:  Patient agrees to Care Plan and Follow-up.  Plan: The Managed Medicaid care management team will reach out to the patient again over the next 45 days.  Date/time of next scheduled RN care management/care coordination outreach:  02/25/22 @ [place] RN, BSN [place]  [employer] manager

## 2022-01-11 NOTE — Patient Instructions (Signed)
Visit Information  Sarah Reilly was given information about Medicaid Managed Care team care coordination services as a part of their Healthy [facility] Medicaid benefit. Sarah Reilly verbally consented to engagement with the [facility] Managed Care team.   If you are experiencing a medical emergency, please call 911 or report to your local emergency department or urgent care.   If you have a non-emergency medical problem during routine business hours, please contact your provider's office and ask to speak with a nurse.   For questions related to your Healthy [facility] health plan, please call: [phone] or visit the homepage here: [email or web address]  If you would like to schedule transportation through your Healthy [facility] plan, please call the following number at least 2 days in advance of your appointment: [phone]  For information about your ride after you set it up, call Ride Assist at [phone]. Use this number to activate a Will Call pickup, or if your transportation is late for a scheduled pickup. Use this number, too, if you need to make a change or cancel a previously scheduled reservation.  If you need transportation services right away, call [phone]. The after-hours call center is staffed 24 hours to handle ride assistance and urgent reservation requests (including discharges) 365 days a year. Urgent trips include sick visits, hospital discharge requests and life-sustaining treatment.  Call the [place] at [phone], at any time, 24 hours a day, 7 days a week. If you are in danger or need immediate medical attention call 911.  If you would like help to quit smoking, call 1-800-QUIT-NOW 917-415-8823) OR Espaol: 1-855-Djelo-Ya (7-867-544-9201) o para ms informacin haga clic aqu or Text READY to 200-400 to register via text  Sarah Reilly,   Please see education materials related to HF and sleep  provided by MyChart link.  Patient verbalizes understanding of instructions and care plan provided today and agrees to view in [place]. Active MyChart status and patient understanding of how to access instructions and care plan via MyChart confirmed with patient.     Telephone follow up appointment with Managed Medicaid care management team member scheduled for:02/25/22 @ [place] RN, [place] RN Care Coordinator   Following is a copy of your plan of care:  Care Plan : RN Care Manager Plan of Care  Updates made by [name], RN since 01/11/2022 12:00 AM     Problem: Chronic Disease Management and Care Coordination Needs for CHF, HTN, Hyperthyroidism   Priority: High  Onset Date: 11/23/2021     Long-Range Goal: Development of Plan of Care for Chronic Disease Management and Care Coordination Needs (CHF, HTN, Hyperthyroidism)   Start Date: 11/23/2021  Expected End Date: 03/23/2022  Priority: High  Note:   Current Barriers:  Knowledge Deficits related to plan of care for management of CHF, HTN, and Hyperthyroidism  Chronic Disease Management support and education needs related to CHF, HTN, and Hyperthyroidism Lacks caregiver support.        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 No Advanced Directives in place  RNCM Clinical Goal(s):  Patient will verbalize understanding of plan for management of CHF, HTN, and Hyperthyroidism as evidenced by improved management of these chronic diseases verbalize basic understanding of CHF, HTN, and Hyperthyroidism disease process and self health management plan as evidenced by improved management of these chronic diseases take all medications exactly as prescribed and will call provider for medication related questions as evidenced by  being compliant with all prescribed medications    attend all scheduled medical appointments: Working on getting new PCP appointment in place  of scheduled appointment with Dr. Margarita Rana on 12/09/2021; 12/21/21 New patient visit with Dr. Rayetta Pigg (Endocrinologist); 01/07/2022 Dr. Haroldine Laws as evidenced by attending all scheduled appointments        demonstrate improved adherence to prescribed treatment plan for CHF, HTN, and Hyperthyroidism as evidenced by improved management of these diseases. continue to work with Consulting civil engineer and/or Social Worker to address care management and care coordination needs related to CHF, HTN, and Hyperthyroidism as evidenced by adherence to CM Team Scheduled appointments     through collaboration with Consulting civil engineer, provider, and care team.   Interventions: Inter-disciplinary care team collaboration (see longitudinal plan of care) Evaluation of current treatment plan related to  self management and patient's adherence to plan as established by provider   Heart Failure Interventions:  (Status: Goal on Track (progressing): YES.)  Long Term Goal  Wt Readings from Last 3 Encounters:  01/07/22 221 lb (100.2 kg)  12/22/21 215 lb 6.4 oz (97.7 kg)  12/09/21 223 lb (101.2 kg)  Reviewed Heart Failure Action Plan in depth and provided written copy Discussed importance of daily weight and advised patient to weigh and record daily Discussed the importance of keeping all appointments with provider Provided patient with education about the role of exercise in the management of heart failure Assessed social determinant of health barriers Reviewed providers notes, discussed visit with patient Discussed benefits of sleep hygiene, examples provided  Hyperthyroidism  (Status: Goal on Track (progressing): YES.) Long Term Goal  Evaluation of current treatment plan related to  Hyperthyroidism , Financial constraints related to increased rent and having to reapply for Food Stamps (done)  self-management and patient's adherence to plan as established by provider. Discussed plans with patient for ongoing care management  follow up and provided patient with direct contact information for care management team Reviewed medications with patient and discussed importance of medication compliance; Reviewed scheduled/upcoming provider appointments including : See appointments in [name] ; Screening for signs and symptoms of depression related to chronic disease state;  Assessed social determinant of health barriers;  Collaborated with PCP for new referral for Endocrinology in [place]   Hypertension: (Status: Goal on Track (progressing): YES.) Long Term Goal  Last practice recorded BP readings:  BP Readings from Last 3 Encounters:  01/07/22 110/70  12/22/21 100/68  12/09/21 137/79  Most recent eGFR/CrCl:  Lab Results  Component Value Date   EGFR 103 12/09/2021    No components found for: "CRCL"  Evaluation of current treatment plan related to hypertension self management and patient's adherence to plan as established by provider;   Reviewed prescribed diet low sodium  Reviewed medications with patient and discussed importance of compliance;  Provided assistance with obtaining home blood pressure monitor via PCP order to [place];  Counseled on the importance of exercise goals with target of 150 minutes per week Discussed plans with patient for ongoing care management follow up and provided patient with direct contact information for care management team; Reviewed scheduled/upcoming provider appointments including:  Screening for signs and symptoms of depression related to chronic disease state;  Assessed social determinant of health barriers;  Advised patient to incorporate exercise in her daily routine, examples provided including a 10 minute walk each day and online exercise videos Collaborate with PCP regarding sending BP cuff order to [place]  Patient Goals/Self-Care Activities: Take  medications as prescribed   Call pharmacy for medication refills 3-7 days in advance of  running out of medications Perform all self care activities independently  Perform IADL's (shopping, preparing meals, housekeeping, managing finances) independently Call provider office for new concerns or questions  call office if I gain more than 2 pounds in one day or 5 pounds in one week watch for swelling in feet, ankles and legs every day weigh myself daily keep all doctor appointments take medications for blood pressure exactly as prescribed Work with RN Care Manager to obtain a blood pressure monitor via [employer].

## 2022-01-24 ENCOUNTER — Other Ambulatory Visit: Payer: Self-pay | Admitting: Family Medicine

## 2022-01-24 DIAGNOSIS — E0590 Thyrotoxicosis, unspecified without thyrotoxic crisis or storm: Secondary | ICD-10-CM

## 2022-02-25 ENCOUNTER — Other Ambulatory Visit: Payer: Self-pay | Admitting: Family Medicine

## 2022-02-25 ENCOUNTER — Other Ambulatory Visit: Payer: Self-pay | Admitting: *Deleted

## 2022-02-25 DIAGNOSIS — E0590 Thyrotoxicosis, unspecified without thyrotoxic crisis or storm: Secondary | ICD-10-CM

## 2022-02-25 NOTE — Patient Instructions (Signed)
Visit Information  Ms. Pope was given information about Medicaid Managed Care team care coordination services as a part of their Healthy [employer] Medicaid benefit. Ross Z Karras verbally consented to engagement with the Medicaid Managed Care team.   If you are experiencing a medical emergency, please call 911 or report to your local emergency department or urgent care.   If you have a non-emergency medical problem during routine business hours, please contact your provider's office and ask to speak with a nurse.   For questions related to your Healthy [employer] Medicaid health plan, please call: [phone] or visit the homepage here: [email or web address]  If you would like to schedule transportation through your Healthy [employer] Medicaid plan, please call the following number at least 2 days in advance of your appointment: [phone]  For information about your ride after you set it up, call Ride Assist at [phone]. Use this number to activate a Will Call pickup, or if your transportation is late for a scheduled pickup. Use this number, too, if you need to make a change or cancel a previously scheduled reservation.  If you need transportation services right away, call [phone]. The after-hours call center is staffed 24 hours to handle ride assistance and urgent reservation requests (including discharges) 365 days a year. Urgent trips include sick visits, hospital discharge requests and life-sustaining treatment.  Call the Behavioral Health Crisis Line at [phone], at any time, 24 hours a day, 7 days a week. If you are in danger or need immediate medical attention call 911.  If you would like help to quit smoking, call 1-800-QUIT-NOW (1-800-784-8669) OR Espaol: 1-855-Djelo-Ya (1-855-335-3569) o para ms informacin haga clic aqu or Text READY to 200-400 to register via text  Ms. Creasy,   Please see education materials related to HF provided by  MyChart link.  Patient verbalizes understanding of instructions and care plan provided today and agrees to view in MyChart. Active MyChart status and patient understanding of how to access instructions and care plan via MyChart confirmed with patient.     Telephone follow up appointment with Managed Medicaid care management team member scheduled for:04/01/22 @ 9am  Melanie Robb RN, BSN [place]  [facility] RN Care Coordinator   Following is a copy of your plan of care:  Care Plan : RN Care Manager Plan of Care  Updates made by Robb, Melanie A, RN since 02/25/2022 12:00 AM     Problem: Chronic Disease Management and Care Coordination Needs for CHF, HTN, Hyperthyroidism   Priority: High  Onset Date: 11/23/2021     Long-Range Goal: Development of Plan of Care for Chronic Disease Management and Care Coordination Needs (CHF, HTN, Hyperthyroidism)   Start Date: 11/23/2021  Expected End Date: 03/23/2022  Priority: High  Note:   Current Barriers:  Knowledge Deficits related to plan of care for management of CHF, HTN, and Hyperthyroidism  Chronic Disease Management support and education needs related to CHF, HTN, and Hyperthyroidism Lacks caregiver support.        Financial Constraints.  Non-adherence to prescribed medication regimen No Advanced Directives in place  RNCM Clinical Goal(s):  Patient will verbalize understanding of plan for management of CHF, HTN, and Hyperthyroidism as evidenced by improved management of these chronic diseases verbalize basic understanding of CHF, HTN, and Hyperthyroidism disease process and self health management plan as evidenced by improved management of these chronic diseases take all medications exactly as prescribed and will call provider for medication related questions as evidenced by being compliant   with all prescribed medications    attend all scheduled medical appointments: Working on getting new PCP appointment in place of scheduled  appointment with Dr. Margarita Rana on 12/09/2021; 12/21/21 New patient visit with Dr. Rayetta Pigg (Endocrinologist); 01/07/2022 Dr. Haroldine Laws as evidenced by attending all scheduled appointments        demonstrate improved adherence to prescribed treatment plan for CHF, HTN, and Hyperthyroidism as evidenced by improved management of these diseases. continue to work with Consulting civil engineer and/or Social Worker to address care management and care coordination needs related to CHF, HTN, and Hyperthyroidism as evidenced by adherence to CM Team Scheduled appointments     through collaboration with Consulting civil engineer, provider, and care team.   Interventions: Inter-disciplinary care team collaboration (see longitudinal plan of care) Evaluation of current treatment plan related to  self management and patient's adherence to plan as established by provider Advised patient to call today and schedule follow up with PCP   Heart Failure Interventions:  (Status: Goal on Track (progressing): YES.)  Long Term Goal  Wt Readings from Last 3 Encounters:  01/07/22 221 lb (100.2 kg)  12/22/21 215 lb 6.4 oz (97.7 kg)  12/09/21 223 lb (101.2 kg)  Discussed importance of daily weight and advised patient to weigh and record daily Reviewed role of diuretics in prevention of fluid overload and management of heart failure Discussed the importance of keeping all appointments with provider Provided patient with education about the role of exercise in the management of heart failure Assessed social determinant of health barriers Reviewed providers notes, discussed visit with patient  Hyperthyroidism  (Status: Goal on track: NO.) Long Term Goal  Evaluation of current treatment plan related to  Hyperthyroidism , Financial constraints related to increased rent and having to reapply for Food Stamps (done)  self-management and patient's adherence to plan as established by provider. Discussed plans with patient for ongoing care management  follow up and provided patient with direct contact information for care management team Reviewed medications with patient and discussed importance of medication compliance; Reviewed scheduled/upcoming provider appointments including : See appointments in [place] ; Screening for signs and symptoms of depression related to chronic disease state;  Assessed social determinant of health barriers;  Collaborated with PCP for new referral for Endocrinology in [place]   Hypertension: (Status: Goal on Track (progressing): YES.) Long Term Goal  Last practice recorded BP readings:  BP Readings from Last 3 Encounters:  01/07/22 110/70  12/22/21 100/68  12/09/21 137/79  Most recent eGFR/CrCl:  Lab Results  Component Value Date   EGFR 103 12/09/2021    No components found for: "CRCL"  Evaluation of current treatment plan related to hypertension self management and patient's adherence to plan as established by provider;   Reviewed prescribed diet low sodium  Reviewed medications with patient and discussed importance of compliance;  Counseled on the importance of exercise goals with target of 150 minutes per week Discussed plans with patient for ongoing care management follow up and provided patient with direct contact information for care management team; Reviewed scheduled/upcoming provider appointments including:  Screening for signs and symptoms of depression related to chronic disease state;  Assessed social determinant of health barriers;  Advised patient to incorporate exercise in her daily routine, examples provided including a 10 minute walk each day and online exercise videos Encouraged patient to contact [employer] member services for wellness benefits(WW vouchers, online exercise and nutrition coaching, free fresh fruits and vegetables)  Patient Goals/Self-Care Activities: Take  medications as prescribed   Call pharmacy for medication refills 3-7 days in advance of running  out of medications Perform all self care activities independently  Perform IADL's (shopping, preparing meals, housekeeping, managing finances) independently Call provider office for new concerns or questions  call office if I gain more than 2 pounds in one day or 5 pounds in one week watch for swelling in feet, ankles and legs every day weigh myself daily keep all doctor appointments take medications for blood pressure exactly as prescribed        

## 2022-02-25 NOTE — Patient Outreach (Signed)
Medicaid Managed Care   Nurse Care Manager Note  02/25/2022 Name:  Sarah Reilly MRN:  [id number] DOB:  December 28, 1980  Sarah Reilly is an 41 y.o. year old female who is a primary patient of Sarah Rakes, MD.  The [facility] Managed Care Coordination team was consulted for assistance with:    CHF HTN Hyperthyroid  Ms. Sarah Reilly was given information about Medicaid Managed Care Coordination team services today. [name] agreed to services and verbal consent obtained.  Engaged with patient by telephone for follow up visit in response to provider referral for case management and/or care coordination services.   Assessments/Interventions:  Review of past medical history, allergies, medications, health status, including review of consultants reports, laboratory and other test data, was performed as part of comprehensive evaluation and provision of chronic care management services.  SDOH (Social Determinants of Health) assessments and interventions performed:   Care Plan  Allergies  Allergen Reactions   Celery Oil Anaphylaxis   Shellfish Allergy Anaphylaxis   Entresto [Sacubitril-Valsartan] Other (See Comments)    GI upset   Hydrocodone Other (See Comments)    Reaction happened in the spine   Latex Rash and Other (See Comments)    EKG leads cause rashes- cannot tolerate for any extended length of time    Medications Reviewed Today     Reviewed by Sarah Montane, RN (Registered Nurse) on 02/25/22 at [place] List Status: <None>   Medication Order Taking? Sig Documenting Provider Last Dose Status Informant  acetaminophen (TYLENOL) 500 MG tablet [id number]  Take 500-1,000 mg by mouth 4 (four) times daily as needed (for pain). [provider]  Active Self  carvedilol (COREG) 6.25 MG tablet [id number]  Take 1 tablet (6.25 mg total) by mouth 2 (two) times daily with a meal. [name], [name], MD  Active   digoxin (LANOXIN) 0.125 MG tablet [id number]  Take  1 tablet (0.125 mg total) by mouth daily. Sarah Rakes, MD  Active   lisinopril (ZESTRIL) 20 MG tablet [id number]  Take 1 tablet (20 mg total) by mouth daily. Sarah Rakes, MD  Active   methimazole (TAPAZOLE) 10 MG tablet [id number]  Take 20 mg by mouth daily. [provider]  Active   omeprazole (PRILOSEC) 20 MG capsule [id number]  Take 20 mg by mouth as needed. [provider]  Active   potassium chloride SA (KLOR-CON M) 20 MEQ tablet [id number]  Take 1 tablet (20 mEq total) by mouth daily. Sarah Breeding, MD  Active Self           Med Note [facility] [place]   Fri Jan 07, 2022  1:57 PM)    spironolactone (ALDACTONE) 25 MG tablet [id number]  Take 1 tablet (25 mg total) by mouth daily. Sarah Rakes, MD  Active   torsemide (DEMADEX) 20 MG tablet [id number]  Take 2 tablets (40 mg total) by mouth daily. 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   SOB (shortness of breath) 11/03/2017   Severe  mitral regurgitation 07/20/2009   CHEST PAIN 07/20/2009   Essential hypertension 06/16/2009   AGORAPHOBIA 04/22/2009   MIGRAINE HEADACHE 04/22/2009   PYELONEPHRITIS 04/22/2009   SYNCOPE 04/22/2009   DIZZINESS 04/22/2009   PALPITATIONS 04/22/2009   ELECTROCARDIOGRAM, ABNORMAL 04/22/2009    Conditions to be addressed/monitored per PCP order:  CHF, HTN, and Hyperthyroid  Care Plan : RN Care Manager Plan of Care  Updates made by Sarah Montane, RN since 02/25/2022 12:00 AM     Problem: Chronic Disease  Management and Care Coordination Needs for CHF, HTN, Hyperthyroidism   Priority: High  Onset Date: 11/23/2021     Long-Range Goal: Development of Plan of Care for Chronic Disease Management and Care Coordination Needs (CHF, HTN, Hyperthyroidism)   Start Date: 11/23/2021  Expected End Date: 03/23/2022  Priority: High  Note:   Current Barriers:  Knowledge Deficits related to plan of care for management of CHF, HTN, and Hyperthyroidism  Chronic Disease Management support and education needs related to CHF, HTN, and Hyperthyroidism Lacks caregiver support.        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 No Advanced Directives in place  RNCM Clinical Goal(s):  Patient will verbalize understanding of plan for management of CHF, HTN, and Hyperthyroidism as evidenced by improved management of these chronic diseases verbalize basic understanding of CHF, HTN, and Hyperthyroidism disease process and self health management plan as evidenced by improved management of these chronic diseases take all medications exactly as prescribed and will call provider for medication related questions as evidenced by being compliant with all prescribed medications    attend all scheduled medical appointments: Working on getting new PCP appointment in place of scheduled appointment with Dr. Margarita Rana on 12/09/2021; 12/21/21 New patient visit with Dr. Rayetta Reilly (Endocrinologist); 01/07/2022 Dr. Haroldine Reilly as evidenced by attending all scheduled appointments        demonstrate improved adherence to prescribed treatment plan for CHF, HTN, and Hyperthyroidism as evidenced by improved management of these diseases. continue to work with Consulting civil engineer and/or Social Worker to address care management and care coordination needs related to CHF, HTN, and Hyperthyroidism as evidenced by adherence to CM Team Scheduled appointments     through collaboration with Consulting civil engineer, provider, and care team.    Interventions: Inter-disciplinary care team collaboration (see longitudinal plan of care) Evaluation of current treatment plan related to  self management and patient's adherence to plan as established by provider Advised patient to call today and schedule follow up with PCP   Heart Failure Interventions:  (Status: Goal on Track (progressing): YES.)  Long Term Goal  Wt Readings from Last 3 Encounters:  01/07/22 221 lb (100.2 kg)  12/22/21 215 lb 6.4 oz (97.7 kg)  12/09/21 223 lb (101.2 kg)  Discussed importance of daily weight and advised patient to weigh and record daily Reviewed role of diuretics in prevention of fluid overload and management of heart failure Discussed the importance of keeping all appointments with provider Provided patient with education about the role of exercise in the management of heart failure Assessed social determinant of health barriers Reviewed providers notes, discussed visit with patient  Hyperthyroidism  (Status: Goal on track: NO.) Long Term Goal  Evaluation of current treatment plan related to  Hyperthyroidism , Financial constraints related to increased rent and having to reapply for Food Stamps (done)  self-management and patient's adherence to plan as established by provider. Discussed plans with patient for ongoing care management follow up and provided patient  with direct contact information for care management team Reviewed medications with patient and discussed importance of medication compliance; Reviewed scheduled/upcoming provider appointments including : See appointments in [place] ; Screening for signs and symptoms of depression related to chronic disease state;  Assessed social determinant of health barriers;  Collaborated with PCP for new referral for Endocrinology in [place]   Hypertension: (Status: Goal on Track (progressing): YES.) Long Term Goal  Last practice recorded BP readings:  BP Readings from Last 3  Encounters:  01/07/22 110/70  12/22/21 100/68  12/09/21 137/79  Most recent eGFR/CrCl:  Lab Results  Component Value Date   EGFR 103 12/09/2021    No components found for: "CRCL"  Evaluation of current treatment plan related to hypertension self management and patient's adherence to plan as established by provider;   Reviewed prescribed diet low sodium  Reviewed medications with patient and discussed importance of compliance;  Counseled on the importance of exercise goals with target of 150 minutes per week Discussed plans with patient for ongoing care management follow up and provided patient with direct contact information for care management team; Reviewed scheduled/upcoming provider appointments including:  Screening for signs and symptoms of depression related to chronic disease state;  Assessed social determinant of health barriers;  Advised patient to incorporate exercise in her daily routine, examples provided including a 10 minute walk each day and online exercise videos Encouraged patient to contact [employer] member services for wellness benefits(WW vouchers, online exercise and nutrition coaching, free fresh fruits and vegetables)  Patient Goals/Self-Care Activities: Take medications as prescribed   Call pharmacy for medication refills 3-7 days in advance of running out of medications Perform all self care activities independently  Perform IADL's (shopping, preparing meals, housekeeping, managing finances) independently Call provider office for new concerns or questions  call office if I gain more than 2 pounds in one day or 5 pounds in one week watch for swelling in feet, ankles and legs every day weigh myself daily keep all doctor appointments take medications for blood pressure exactly as prescribed       Follow Up:  Patient agrees to Care Plan and Follow-up.  Plan: The Managed Medicaid care management team will reach out to the patient again over the next 30  days.  Date/time of next scheduled RN care management/care coordination outreach:  04/01/22 @ [place] RN, [place] RN Care Coordinator

## 2022-03-17 ENCOUNTER — Other Ambulatory Visit: Payer: Self-pay | Admitting: Family Medicine

## 2022-03-18 NOTE — Telephone Encounter (Signed)
Requested medication (s) are due for refill today: -  Requested medication (s) are on the active medication list: historical med  Last refill:  01/07/22  Future visit scheduled: no  Notes to clinic:  prescription not delegated to NT   Requested Prescriptions  Pending Prescriptions Disp Refills   methimazole (TAPAZOLE) 10 MG tablet [Pharmacy Med Name: METHIMAZOLE 10 MG ORAL TABLET] 90 tablet     Sig: TAKE ONE TABLET BY MOUTH THREE TIMES A DAY.     Not Delegated - Endocrinology:  Hyperthyroid Agents Failed - 03/17/2022  6:00 PM      Failed - This refill cannot be delegated      Failed - TSH in normal range and within 180 days    TSH  Date Value Ref Range Status  12/09/2021 0.015 (L) 0.450 - 4.500 uIU/mL Final         Failed - T3 Total in normal range and within 180 days    T3, Free  Date Value Ref Range Status  11/06/2021 4.8 (H) 2.0 - 4.4 pg/mL Final    Comment:    (NOTE) Performed At: [facility] [place], [place] [id number] Rush Farmer MD [id number]          Passed - T4 free in normal range and within 180 days    Free T4  Date Value Ref Range Status  12/09/2021 1.14 0.82 - 1.77 ng/dL Final         Passed - Valid encounter within last 6 months    Recent Outpatient Visits           3 months ago Abdominal discomfort   [place], [place], MD   3 years ago Hyperthyroidism   [facility] [name], MD

## 2022-04-01 ENCOUNTER — Ambulatory Visit: Payer: Medicaid Other

## 2022-04-14 ENCOUNTER — Ambulatory Visit: Payer: Self-pay

## 2022-04-25 ENCOUNTER — Other Ambulatory Visit: Payer: Self-pay | Admitting: *Deleted

## 2022-04-25 NOTE — Patient Instructions (Signed)
Visit Information  Ms. Iglesia was given information about Medicaid Managed Care team care coordination services as a part of their Healthy [facility] Medicaid benefit. [name] verbally consented to engagement with the [facility] Managed Care team.   If you are experiencing a medical emergency, please call 911 or report to your local emergency department or urgent care.   If you have a non-emergency medical problem during routine business hours, please contact your provider's office and ask to speak with a nurse.   For questions related to your Healthy [facility] health plan, please call: [phone] or visit the homepage here: [email or web address]  If you would like to schedule transportation through your Healthy [facility] plan, please call the following number at least 2 days in advance of your appointment: [phone]  For information about your ride after you set it up, call Ride Assist at [phone]. Use this number to activate a Will Call pickup, or if your transportation is late for a scheduled pickup. Use this number, too, if you need to make a change or cancel a previously scheduled reservation.  If you need transportation services right away, call [phone]. The after-hours call center is staffed 24 hours to handle ride assistance and urgent reservation requests (including discharges) 365 days a year. Urgent trips include sick visits, hospital discharge requests and life-sustaining treatment.  Call the [place] at [phone], at any time, 24 hours a day, 7 days a week. If you are in danger or need immediate medical attention call 911.  If you would like help to quit smoking, call 1-800-QUIT-NOW 951-652-2121) OR Espaol: 1-855-Djelo-Ya (3-335-456-2563) o para ms informacin haga clic aqu or Text READY to 200-400 to register via text  [name],   Please see education materials related to hyperthyroid  and HF provided by MyChart link.  Patient verbalizes understanding of instructions and care plan provided today and agrees to view in [place]. Active MyChart status and patient understanding of how to access instructions and care plan via MyChart confirmed with patient.     Telephone follow up appointment with Managed Medicaid care management team member scheduled for:05/25/22 @ 3:30pm  [name] RN, BSN [place] RN Care Coordinator   Following is a copy of your plan of care:  Care Plan : RN Care Manager Plan of Care  Updates made by [name], RN since 04/25/2022 12:00 AM     Problem: Chronic Disease Management and Care Coordination Needs for CHF, HTN, Hyperthyroidism   Priority: High  Onset Date: 11/23/2021     Long-Range Goal: Development of Plan of Care for Chronic Disease Management and Care Coordination Needs (CHF, HTN, Hyperthyroidism)   Start Date: 11/23/2021  Expected End Date: 06/24/2022  Priority: High  Note:   Current Barriers:  Knowledge Deficits related to plan of care for management of CHF, HTN, and Hyperthyroidism  Chronic Disease Management support and education needs related to CHF, HTN, and Hyperthyroidism Lacks caregiver support.        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 No Advanced Directives in place  RNCM Clinical Goal(s):  Patient will verbalize understanding of plan for management of CHF, HTN, and Hyperthyroidism as evidenced by improved management of these chronic diseases verbalize basic understanding of CHF, HTN, and Hyperthyroidism disease process and self health management plan as evidenced by improved management of these chronic diseases take all medications exactly as prescribed and will call provider for medication related questions as evidenced by  being compliant with all prescribed medications    attend all scheduled medical appointments: Schedule follow up with PCP,  05/10/22 with HF Clinic and call for appointment details with [place] as evidenced by attending all scheduled appointments        demonstrate improved adherence to prescribed treatment plan for CHF, HTN, and Hyperthyroidism as evidenced by improved management of these diseases. continue to work with Consulting civil engineer and/or Social Worker to address care management and care coordination needs related to CHF, HTN, and Hyperthyroidism as evidenced by adherence to CM Team Scheduled appointments     through collaboration with Consulting civil engineer, provider, and care team.   Interventions: Inter-disciplinary care team collaboration (see longitudinal plan of care) Evaluation of current treatment plan related to  self management and patient's adherence to plan as established by provider Advised patient to call today and schedule follow up with PCP   Heart Failure Interventions:  (Status: Goal on Track (progressing): YES.)  Long Term Goal  Wt Readings from Last 3 Encounters:  01/07/22 221 lb (100.2 kg)  12/22/21 215 lb 6.4 oz (97.7 kg)  12/09/21 223 lb (101.2 kg)  Discussed importance of daily weight and advised patient to weigh and record daily Reviewed role of diuretics in prevention of fluid overload and management of heart failure Discussed the importance of keeping all appointments with provider Provided patient with education about the role of exercise in the management of heart failure Assessed social determinant of health barriers Reviewed providers notes, discussed visit with patient Advised patient to discuss torsemide effects with provider  Hyperthyroidism  (Status: Goal on Track (progressing): YES.) Long Term Goal  Evaluation of current treatment plan related to  Hyperthyroidism , Financial constraints related to increased rent and having to reapply for Food Stamps (done)  self-management and patient's adherence to plan as established by provider. Discussed plans with patient for  ongoing care management follow up and provided patient with direct contact information for care management team Provided education to patient re: hyperthyroidism; Reviewed medications with patient and discussed importance of medication compliance; Reviewed scheduled/upcoming provider appointments including : See appointments in [place] ; Discussed plans with patient for ongoing care management follow up and provided patient with direct contact information for care management team; Provided patient with [facility], [address], [place], [place] [address], Phone: [phone]   Hypertension: (Status: Goal on Track (progressing): YES.) Long Term Goal  Last practice recorded BP readings:  BP Readings from Last 3 Encounters:  01/07/22 110/70  12/22/21 100/68  12/09/21 137/79  Most recent eGFR/CrCl:  Lab Results  Component Value Date   EGFR 103 12/09/2021    No components found for: "CRCL"  Reviewed medications with patient and discussed importance of compliance;  Counseled on the importance of exercise goals with target of 150 minutes per week Discussed plans with patient for ongoing care management follow up and provided patient with direct contact information for care management team; Reviewed scheduled/upcoming provider appointments including:  Collaborated with PCP for BP monitor-request sending to [place] Encouraged patient to contact [employer] member services for wellness benefits(WW vouchers, online exercise and nutrition coaching, free fresh fruits and vegetables)-revisited  Patient Goals/Self-Care Activities: Take medications as prescribed   Call pharmacy for medication refills 3-7 days in advance of running out of medications Perform all self care activities independently  Perform IADL's (shopping, preparing meals, housekeeping, managing finances) independently Call provider office for new concerns or questions  call office if I  gain more than 2 pounds in one day or 5 pounds in one week watch for swelling in feet, ankles and legs every day weigh myself daily keep all doctor appointments take medications for blood pressure exactly as prescribed

## 2022-04-25 NOTE — Patient Outreach (Signed)
Medicaid Managed Care   Nurse Care Manager Note  04/25/2022 Name:  Sarah Reilly MRN:  [id number] DOB:  1980/11/03  Sarah Reilly is an 41 y.o. year old female who is a primary patient of [name], MD.  The [facility] Managed Care Coordination team was consulted for assistance with:    CHF HTN Hyperthyroid  Ms. Mcmurry was given information about Medicaid Managed Care Coordination team services today. [name] agreed to services and verbal consent obtained.  Engaged with patient by telephone for follow up visit in response to provider referral for case management and/or care coordination services.   Assessments/Interventions:  Review of past medical history, allergies, medications, health status, including review of consultants reports, laboratory and other test data, was performed as part of comprehensive evaluation and provision of chronic care management services.  SDOH (Social Determinants of Health) assessments and interventions performed: SDOH Interventions    Flowsheet Row Patient Outreach Telephone from 11/23/2021 in [place] Telephone from 11/11/2021 in [place] Telephone from 11/05/2021 in [place] Telephone from 10/29/2021 in [place] Interventions Other (Comment)  [In the process of reapplying for Food Stamps] -- -- --  Housing Interventions Intervention Not Indicated Intervention Not Indicated -- --  Transportation Interventions Other (Comment)  [Currently connected to [employer] [place] [employer]] -- [employer] Given, Water engineer  [LCSW provided taxi voucher to get to [employer] and home, provided information about [employer] transport for appts moving forward.]  Financial Strain Interventions Intervention Not Indicated -- -- --  Physical Activity  Interventions Other (Comments)  [Patient states she is fearful of going outside and prevents her from walking/exercising outside.] -- -- --  Stress Interventions Intervention Not Indicated -- -- --  Social Connections Interventions Intervention Not Indicated -- -- --       Care Plan  Allergies  Allergen Reactions   Celery Oil Anaphylaxis   Shellfish Allergy Anaphylaxis   Entresto [Sacubitril-Valsartan] Other (See Comments)    GI upset   Hydrocodone Other (See Comments)    Reaction happened in the spine   Latex Rash and Other (See Comments)    EKG leads cause rashes- cannot tolerate for any extended length of time    Medications Reviewed Today     Reviewed by [name], RN (Registered Nurse) on 04/25/22 at [place] List Status: <None>   Medication Order Taking? Sig Documenting Provider Last Dose Status Informant  acetaminophen (TYLENOL) 500 MG tablet [id number] Yes Take 500-1,000 mg by mouth 4 (four) times daily as needed (for pain). [provider] Taking Active Self  carvedilol (COREG) 6.25 MG tablet [id number] Yes Take 1 tablet (6.25 mg total) by mouth 2 (two) times daily with a meal. [name], [name], MD Taking Active   digoxin (LANOXIN) 0.125 MG tablet [id number] Yes Take 1 tablet (0.125 mg total) by mouth daily. [name], MD Taking Active   lisinopril (ZESTRIL) 20 MG tablet [id number] Yes Take 1 tablet (20 mg total) by mouth daily. [name], MD Taking Active   methimazole (TAPAZOLE) 10 MG tablet [id number] Yes TAKE ONE TABLET BY MOUTH THREE TIMES A DAY. [name], MD Taking Active   omeprazole (PRILOSEC) 20 MG capsule [id number] No Take 20 mg by mouth as needed.  Patient not taking: Reported on 02/25/2022   [provider] Not Taking Active  potassium chloride SA (KLOR-CON M) 20 MEQ tablet [id number] Yes Take 1 tablet (20 mEq total) by mouth daily. Minus Breeding, MD Taking Active Self           Med Note [facility] [place]   Fri Jan 07, 2022  1:57 PM)    spironolactone (ALDACTONE) 25 MG tablet [id number] Yes Take 1 tablet (25 mg total) by mouth daily. [name], MD Taking Active   torsemide (DEMADEX) 20 MG tablet [id number] Yes Take 2 tablets (40 mg total) by mouth daily. [name], MD Taking Active            Med Note (ROBB, MELANIE A   Fri Feb 25, 2022  9:18 AM) Taking every other day            Patient Active Problem List   Diagnosis Date Noted   Acute on chronic systolic heart failure ([place]) 11/05/2021   Educated about COVID-19 virus infection 73/42/8768   Acute systolic HF (heart failure) ([place]) 01/15/2019   Hyperthyroidism 01/15/2019   Morbid obesity ([place]) 01/01/2019   Sinus tachycardia 01/01/2019   Family history of diabetes mellitus 01/01/2019   Medication management 11/05/2017   Other fatigue 11/05/2017   Cardiomyopathy, peripartum, 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   SOB (shortness of breath) 11/03/2017   Severe mitral regurgitation 07/20/2009   CHEST PAIN 07/20/2009   Essential hypertension 06/16/2009   AGORAPHOBIA 04/22/2009   MIGRAINE HEADACHE 04/22/2009   PYELONEPHRITIS 04/22/2009   SYNCOPE 04/22/2009   DIZZINESS 04/22/2009   PALPITATIONS 04/22/2009   ELECTROCARDIOGRAM, ABNORMAL 04/22/2009    Conditions to be addressed/monitored per PCP order:  CHF, HTN, and hyperthyroid  Care Plan : RN Care Manager Plan of Care  Updates made by [name], RN since 04/25/2022 12:00 AM     Problem: Chronic Disease Management and Care Coordination Needs for CHF, HTN, Hyperthyroidism   Priority: High  Onset Date: 11/23/2021     Long-Range Goal: Development of Plan of Care for Chronic Disease Management and Care Coordination Needs (CHF, HTN, Hyperthyroidism)   Start Date: 11/23/2021  Expected End Date: 06/24/2022  Priority: High  Note:   Current Barriers:  Knowledge Deficits related to plan of care for management of CHF, HTN, and Hyperthyroidism  Chronic Disease Management support and education  needs related to CHF, HTN, and Hyperthyroidism Lacks caregiver support.        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 No Advanced Directives in place  RNCM Clinical Goal(s):  Patient will verbalize understanding of plan for management of CHF, HTN, and Hyperthyroidism as evidenced by improved management of these chronic diseases verbalize basic understanding of CHF, HTN, and Hyperthyroidism disease process and self health management plan as evidenced by improved management of these chronic diseases take all medications exactly as prescribed and will call provider for medication related questions as evidenced by being compliant with all prescribed medications    attend all scheduled medical appointments: Schedule follow up with PCP, 05/10/22 with HF Clinic and call for appointment details with [place] as evidenced by attending all scheduled appointments        demonstrate improved adherence to prescribed treatment plan for CHF, HTN, and Hyperthyroidism as evidenced by improved management of these diseases. continue to work with Consulting civil engineer and/or Social Worker to address care management and care coordination needs related to CHF, HTN, and Hyperthyroidism as evidenced by adherence to CM Team Scheduled appointments     through collaboration with Consulting civil engineer, provider, and care  team.   Interventions: Inter-disciplinary care team collaboration (see longitudinal plan of care) Evaluation of current treatment plan related to  self management and patient's adherence to plan as established by provider Advised patient to call today and schedule follow up with PCP   Heart Failure Interventions:  (Status: Goal on Track (progressing): YES.)  Long Term Goal  Wt Readings from Last 3 Encounters:  01/07/22 221 lb (100.2 kg)  12/22/21 215 lb 6.4 oz (97.7 kg)  12/09/21 223 lb (101.2 kg)  Discussed importance of daily weight and advised patient to weigh and  record daily Reviewed role of diuretics in prevention of fluid overload and management of heart failure Discussed the importance of keeping all appointments with provider Provided patient with education about the role of exercise in the management of heart failure Assessed social determinant of health barriers Reviewed providers notes, discussed visit with patient Advised patient to discuss torsemide effects with provider  Hyperthyroidism  (Status: Goal on Track (progressing): YES.) Long Term Goal  Evaluation of current treatment plan related to  Hyperthyroidism , Financial constraints related to increased rent and having to reapply for Food Stamps (done)  self-management and patient's adherence to plan as established by provider. Discussed plans with patient for ongoing care management follow up and provided patient with direct contact information for care management team Provided education to patient re: hyperthyroidism; Reviewed medications with patient and discussed importance of medication compliance; Reviewed scheduled/upcoming provider appointments including : See appointments in [place] ; Discussed plans with patient for ongoing care management follow up and provided patient with direct contact information for care management team; Provided patient with [facility], [address], [place], [place] [address], Phone: [phone]   Hypertension: (Status: Goal on Track (progressing): YES.) Long Term Goal  Last practice recorded BP readings:  BP Readings from Last 3 Encounters:  01/07/22 110/70  12/22/21 100/68  12/09/21 137/79  Most recent eGFR/CrCl:  Lab Results  Component Value Date   EGFR 103 12/09/2021    No components found for: "CRCL"  Reviewed medications with patient and discussed importance of compliance;  Counseled on the importance of exercise goals with target of 150 minutes per week Discussed plans with patient for ongoing care  management follow up and provided patient with direct contact information for care management team; Reviewed scheduled/upcoming provider appointments including:  Collaborated with PCP for BP monitor-request sending to [place] Encouraged patient to contact [employer] member services for wellness benefits(WW vouchers, online exercise and nutrition coaching, free fresh fruits and vegetables)-revisited  Patient Goals/Self-Care Activities: Take medications as prescribed   Call pharmacy for medication refills 3-7 days in advance of running out of medications Perform all self care activities independently  Perform IADL's (shopping, preparing meals, housekeeping, managing finances) independently Call provider office for new concerns or questions  call office if I gain more than 2 pounds in one day or 5 pounds in one week watch for swelling in feet, ankles and legs every day weigh myself daily keep all doctor appointments take medications for blood pressure exactly as prescribed       Follow Up:  Patient agrees to Care Plan and Follow-up.  Plan: The Managed Medicaid care management team will reach out to the patient again over the next 30 days.  Date/time of next scheduled RN care management/care coordination outreach:  05/25/22 @ 3:30pm  [name] RN, BSN [place] RN Care Coordinator

## 2022-04-29 ENCOUNTER — Other Ambulatory Visit: Payer: Self-pay | Admitting: Family Medicine

## 2022-04-29 MED ORDER — MISC. DEVICES MISC: 0 refills | Status: AC

## 2022-05-10 ENCOUNTER — Ambulatory Visit (HOSPITAL_COMMUNITY)
Admission: RE | Admit: 2022-05-10 | Discharge: 2022-05-10 | Disposition: A | Discharge status: Home / Self Care | Payer: Medicaid Other | Source: Ambulatory Visit | Attending: Family Medicine | Admitting: Family Medicine

## 2022-05-10 ENCOUNTER — Encounter (HOSPITAL_COMMUNITY): Payer: Self-pay

## 2022-05-10 VITALS — BP 148/96 | HR 102 | Wt 234.4 lb

## 2022-05-10 DIAGNOSIS — E0590 Thyrotoxicosis, unspecified without thyrotoxic crisis or storm: Secondary | ICD-10-CM | POA: Diagnosis not present

## 2022-05-10 DIAGNOSIS — E669 Obesity, unspecified: Secondary | ICD-10-CM | POA: Diagnosis not present

## 2022-05-10 DIAGNOSIS — I429 Cardiomyopathy, unspecified: Secondary | ICD-10-CM | POA: Diagnosis not present

## 2022-05-10 DIAGNOSIS — I340 Nonrheumatic mitral (valve) insufficiency: Secondary | ICD-10-CM

## 2022-05-10 DIAGNOSIS — Z6836 Body mass index (BMI) 36.0-36.9, adult: Secondary | ICD-10-CM | POA: Insufficient documentation

## 2022-05-10 DIAGNOSIS — R0602 Shortness of breath: Secondary | ICD-10-CM | POA: Insufficient documentation

## 2022-05-10 DIAGNOSIS — I5022 Chronic systolic (congestive) heart failure: Secondary | ICD-10-CM

## 2022-05-10 DIAGNOSIS — Z79899 Other long term (current) drug therapy: Secondary | ICD-10-CM | POA: Insufficient documentation

## 2022-05-10 DIAGNOSIS — R0789 Other chest pain: Secondary | ICD-10-CM | POA: Diagnosis not present

## 2022-05-10 DIAGNOSIS — I110 Hypertensive heart disease with heart failure: Secondary | ICD-10-CM | POA: Insufficient documentation

## 2022-05-10 DIAGNOSIS — R109 Unspecified abdominal pain: Secondary | ICD-10-CM | POA: Insufficient documentation

## 2022-05-10 LAB — BASIC METABOLIC PANEL
Anion gap: 9 (ref 5–15)
BUN: 12 mg/dL (ref 6–20)
CO2: 19 mmol/L — ABNORMAL LOW (ref 22–32)
Calcium: 8.7 mg/dL — ABNORMAL LOW (ref 8.9–10.3)
Chloride: 110 mmol/L (ref 98–111)
Creatinine, Ser: 0.92 mg/dL (ref 0.44–1.00)
GFR, Estimated: >60 mL/min (ref >60)
Glucose, Bld: 93 mg/dL (ref 70–99)
Potassium: 3.9 mmol/L (ref 3.5–5.1)
Sodium: 138 mmol/L (ref 135–145)

## 2022-05-10 LAB — DIGOXIN LEVEL: Digoxin Level: <0.2 ng/mL — ABNORMAL LOW (ref 0.8–2.0)

## 2022-05-10 LAB — BRAIN NATRIURETIC PEPTIDE: B Natriuretic Peptide: 540 pg/mL — ABNORMAL HIGH (ref 0.0–100.0)

## 2022-05-10 MED ORDER — TORSEMIDE 20 MG PO TABS: 40.0000 mg | ORAL_TABLET | Freq: Every day | ORAL | 7 refills | Status: DC

## 2022-05-10 NOTE — Progress Notes (Signed)
ReDS Vest / Clip - 05/10/22 1159       ReDS Vest / Clip   Station Marker C    Ruler Value 25    ReDS Value Range High volume overload    ReDS Actual Value 46    Anatomical Comments sitting

## 2022-05-10 NOTE — Patient Instructions (Addendum)
Thank you for coming in today  Labs were done today, if any labs are abnormal the clinic will call you No news is good news  INCREASE Torsemide to 40 mg 2 tablets daily   Your physician recommends that you return for lab work in:  BMET 10-14 days   Your physician recommends that you schedule a follow-up appointment in:  4 weeks in clinic 3 months with Dr. Gala Romney with echocardiogram  Your physician has requested that you have an echocardiogram. Echocardiography is a painless test that uses sound waves to create images of your heart. It provides your doctor with information about the size and shape of your heart and how well your heart's chambers and valves are working. This procedure takes approximately one hour. There are no restrictions for this procedure.     Do the following things EVERYDAY: Weigh yourself in the morning before breakfast. Write it down and keep it in a log. Take your medicines as prescribed Eat low salt foods--Limit salt (sodium) to 2000 mg per day.  Stay as active as you can everyday Limit all fluids for the day to less than 2 liters  At the [facility], you and your health needs are our priority. As part of our continuing mission to provide you with exceptional heart care, we have created designated Provider Care Teams. These Care Teams include your primary Cardiologist (physician) and Advanced Practice Providers (APPs- Physician Assistants and Nurse Practitioners) who all work together to provide you with the care you need, when you need it.   You may see any of the following providers on your designated Care Team at your next follow up: Dr Arvilla Meres Dr Marca Ancona Dr. Marcos Eke, NP Robbie Lis, [place] [facility] [place], [place] Brynda Peon, NP Karle Plumber, PharmD   Please be sure to bring in all your medications bottles to every appointment.   If you have any questions or concerns before your next  appointment please send [place] a message through [place] or call our office at [phone].    TO LEAVE A MESSAGE FOR THE NURSE SELECT OPTION 2, PLEASE LEAVE A MESSAGE INCLUDING: YOUR NAME DATE OF BIRTH CALL BACK NUMBER REASON FOR CALL**this is important as we prioritize the call backs  YOU WILL RECEIVE A CALL BACK THE SAME DAY AS LONG AS YOU CALL BEFORE 4:00 PM

## 2022-05-10 NOTE — Progress Notes (Signed)
Advanced Heart Failure Clinic Note  PCP: [name], MD Endocrinology: Dr Dwyane Dee Primary Cardiologist: Dr Percival Spanish  HF Cardiologist: Dr Haroldine Laws   HPI: Sarah Reilly is a 41 y.o. 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    She developed HF after the birth of her 65th child in 2010. EF 20%. Did not have a heart cath.  She then moved back to [place] to be by her mother. Apparently EF dropped to 15-20% for a period. They suggested ICD but she refused. She returned to [place].  In July 2019 she was diagnosed with hyperthyroidism. Started on methimazole by Dr Dwyane Dee.   Previously she was having CP in setting of 30 pound weight gain. Dr. Haroldine Laws suggested R/L cath but she refused saying she wanted to try and lose weight first and see how she felt. We eventually rescheduled but she couldn't get COVID test.   She was seen in [facility] 8/21 and was quite tachycardic. She had not seen Dr. Dwyane Dee since 12/20. Ran out of methimazole refils. 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  TSH < 0.010. She has not followed up with Endo. On the chart there is a dismissal letter from Dr. Dwyane Dee dated 02/27/20.  Echo 04/02/20 EF 35-40%, RV normal. Mod-severe MR w/ tethering of posterior leaflet.   Echo 9/22 EF 30-35% RV mildly. Bi atrial enlargement.    Saw Dr Percival Spanish 11/05/21 with increased shortness of breath, orthopnea, and PND. Admitted with A/C HFrEF. TSH 0.005. Echo EF 25-30%. Diuresed with IV lasix. cMRI showed LVEF 25%, RV reduced, no LGE. No clear etiology. GDMT titrated. No Entresto d/t intolerance, started on ACE. Discharged home, weight 210 lbs.  Today she returns for HF follow up. Overall feeling fair. She has chest tightness and fatigue. Main complaint is right flank pain, worse when she lays back. She has SOB  walking up stairs. Has more abdominal fullness. Continues with sharp atypical chest pain, stretching her arms helps the pain. Denies abnormal bleeding, palpitations, dizziness, or PND/Orthopnea. Appetite ok. No fever or chills. Weight at home 230 pounds.Only taking torsemide 40 mg once a week for the past 2 months.    Cardiac Studies: - cMRI (5/23): LVEF 25%, RVEF 30%, no LGE, moderate MR and TR.  - Echo (5/23): EF 25-30%, severe LV dysfunction with global HK, RV mildly down, mild to moderate MR, moderate TR  - Echo (03/09/21): EF 30-35%, RV mildly reduced, severe MR, mild-mod TR   - Echo (04/02/20): EF 35-40%   - Echo (2/21): LV dilated/spherical 30-35% likely severe MR. RV ok   - Echo (12/2018): EF 35-40% Mod-Severe MR, RV normal  - Echo (11/16/2017): EF 20-25% Severe MR   ROS: All systems negative except as listed in HPI, PMH and Problem List.  SH:  Social History   Socioeconomic History   Marital status: Single    Spouse name: Not on file   Number of children: Not on file   Years of education: Not on file   Highest education level: Not on file  Occupational History   Not on file  Tobacco Use   Smoking status: Former   Smokeless tobacco: Never  Substance and Sexual Activity   Alcohol use: Yes    Alcohol/week: 4.0 standard drinks of alcohol    Types: 3 Glasses of wine, 1 Standard drinks or  equivalent per week    Comment: Occassional   Drug use: Never   Sexual activity: Not on file  Other Topics Concern   Not on file  Social History Narrative   She has 4 children ages 54 and under.  She stays at home. She does not smoke cigarettes or drink alcohol. Does not get regular exercise.    Social Determinants of Health   Financial Resource Strain: High Risk (11/23/2021)   Overall Financial Resource Strain (CARDIA)    Difficulty of Paying Living Expenses: Hard  Food Insecurity: Food Insecurity Present (11/23/2021)   Hunger Vital Sign    Worried About Running Out of Food in the  Last Year: Sometimes true    Ran Out of Food in the Last Year: Sometimes true  Transportation Needs: Unmet Transportation Needs (11/23/2021)   PRAPARE - Administrator, Civil Service (Medical): Yes    Lack of Transportation (Non-Medical): Yes  Physical Activity: Inactive (11/23/2021)   Exercise Vital Sign    Days of Exercise per Week: 0 days    Minutes of Exercise per Session: 0 min  Stress: Stress Concern Present (11/23/2021)   [employer] of Occupational Health - Occupational Stress Questionnaire    Feeling of Stress : To some extent  Social Connections: Unknown (11/23/2021)   Social Connection and Isolation Panel [NHANES]    Frequency of Communication with Friends and Family: More than three times a week    Frequency of Social Gatherings with Friends and Family: More than three times a week    Attends Religious Services: Never    Database administrator or Organizations: No    Attends Engineer, structural: Never    Marital Status: Not on file  Intimate Partner Violence: Not on file   FH:  Family History  Problem Relation Age of Onset   Heart disease Mother        No details   Hypertension Mother    Diabetes Mother    Hypertension Father    Heart murmur Sister    Past Medical History:  Diagnosis Date   Cardiomyopathy    CHF (congestive heart failure) (HCC)    HTN (hypertension)    Mitral regurgitation    Current Outpatient Medications  Medication Sig Dispense Refill   acetaminophen (TYLENOL) 500 MG tablet Take 500-1,000 mg by mouth 4 (four) times daily as needed (for pain).     carvedilol (COREG) 6.25 MG tablet Take 1 tablet (6.25 mg total) by mouth 2 (two) times daily with a meal. 60 tablet 6   digoxin (LANOXIN) 0.125 MG tablet Take 1 tablet (0.125 mg total) by mouth daily. 30 tablet 6   lisinopril (ZESTRIL) 20 MG tablet Take 1 tablet (20 mg total) by mouth daily. 30 tablet 3   methimazole (TAPAZOLE) 10 MG tablet TAKE ONE TABLET BY MOUTH THREE  TIMES A DAY. 90 tablet 0   Misc. Devices MISC Blood pressure monitor.  Diagnosis hypertension 1 each 0   omeprazole (PRILOSEC) 20 MG capsule Take 20 mg by mouth as needed.     potassium chloride SA (KLOR-CON M) 20 MEQ tablet Take 1 tablet (20 mEq total) by mouth daily. 90 tablet 3   spironolactone (ALDACTONE) 25 MG tablet Take 1 tablet (25 mg total) by mouth daily. 30 tablet 3   torsemide (DEMADEX) 20 MG tablet Patient takes 2 tablets by mouth once a week.     No current facility-administered medications for this encounter.   BP (!) 148/96  Pulse (!) 102   Wt 106.3 kg (234 lb 6.4 oz)   SpO2 98%   BMI 36.71 kg/m   Wt Readings from Last 3 Encounters:  05/10/22 106.3 kg (234 lb 6.4 oz)  01/07/22 100.2 kg (221 lb)  12/22/21 97.7 kg (215 lb 6.4 oz)   PHYSICAL EXAM: General:  NAD. No resp difficulty HEENT: Normal Neck: Supple. JVP to jaw. Carotids 2+ bilat; no bruits. No lymphadenopathy or thryomegaly appreciated. Cor: PMI nondisplaced. Regular rate & rhythm. No rubs, gallops, 3/6 MR Lungs: Clear Abdomen: Soft, nontender, nondistended. No hepatosplenomegaly. No bruits or masses. Good bowel sounds. Extremities: No cyanosis, clubbing, rash, 1+ BLE edema Neuro: Alert & oriented x 3, cranial nerves grossly intact. Moves all 4 extremities w/o difficulty. Affect pleasant.  ReDs: 46%  ASSESSMENT & PLAN: Chronic Systolic Heart Failure - Onset in 2010. 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 - Echo (5/19): EF 20-25% with severe posterior MR.   - Echo (7/20): EF 35-40% mod-severe MR with restricted posterior leaflet  - Echo (2/21): EF 30-35% mod-severe MR RV ok  - Echo (10/21): EF 35-40% moderate to severe MR  - Echo (9/22): EF 30-35%, RV mildly reduced, Severe MR, Mild TR  - Echo (5/23): EF 25-30%, severe LV dilation, mildly decreased RV function, severe LAE, moderate-severe MR with thickened mitral valve, moderate TR, IVC dilated.  - cMRI (5/23):  LVEF 25% RVEF 30%, No LGE. - NYHA III, volume up today, weight up 13 lbs. ReDs 46% - Restart torsemide 40 mg daily.  - Continue lisinopril 20 mg daily (no Entresto due to GI upset). - Continue spironolactone 25 mg daily.  - Continue digoxin 0.125 mg daily. - Continue carvedilol 6.25 mg bid  - No SGLT2i d/t multiple episodes of pyelonephritis in past. Denies recent GU symptoms.  - Not interested in ICD. - BMET, BNP and digoxin level today. Repeat BMET in 10 days.   2. Hyperthyroidism  - Previously on methimazole and followed by Dr Lucianne Muss.  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   - She was dismissed from [facility].   - TSH <0.010, free T4 high.  - Continue methimazole 20 mg tid.  - Off prednisone due to stomach upset  - Continue beta blocker.  - She has Endocrine follow up at [facility].   3. Mitral regurgitation  - Moderate-severe on echo 5/23.  With severe LV and LA dilation, suspect component of functional MR but the mitral valve is also thickened towards the tips.   - She is not inclined to invasive management/evaluation, hopefully MR will improve with medical management.  - Repeat echo in 3 months.   Follow up in 3 weeks with APP and 3 months with Dr. Gala Romney + echo.   Jacklynn Ganong, FNP  11:41 AM

## 2022-05-25 ENCOUNTER — Encounter: Payer: Self-pay | Admitting: *Deleted

## 2022-05-25 ENCOUNTER — Other Ambulatory Visit: Payer: Medicaid Other | Admitting: *Deleted

## 2022-05-25 NOTE — Patient Outreach (Signed)
Medicaid Managed Care   Nurse Care Manager Note  05/25/2022 Name:  Sarah Reilly MRN:  [id number] DOB:  1981-04-08  Sarah Reilly is an 41 y.o. year old female who is a primary patient of [name], MD.  The [facility] Managed Care Coordination team was consulted for assistance with:    CHF HTN Hyperthyroid  Sarah Reilly was given information about Medicaid Managed Care Coordination team services today. Sarah Reilly Patient agreed to services and verbal consent obtained.  Engaged with patient by telephone for follow up visit in response to provider referral for case management and/or care coordination services.   Assessments/Interventions:  Review of past medical history, allergies, medications, health status, including review of consultants reports, laboratory and other test data, was performed as part of comprehensive evaluation and provision of chronic care management services.  SDOH (Social Determinants of Health) assessments and interventions performed: SDOH Interventions    Flowsheet Row Patient Outreach Telephone from 05/25/2022 in [place] Patient Outreach Telephone from 11/23/2021 in [place] Telephone from 11/11/2021 in [name] Telephone from 11/05/2021 in [place] Telephone from 10/29/2021 in [place] Interventions -- Other (Comment)  [In the process of reapplying for Food Stamps] -- -- --  Housing Interventions -- Intervention Not Indicated Intervention Not Indicated -- --  Transportation Interventions -- Other (Comment)  [Currently connected to [employer]] -- [employer] Given, Water engineer  [LCSW provided taxi voucher to get to [employer] and home, provided information about [employer] transport for appts moving  forward.]  Depression Interventions/Treatment  --  Financial planner to LCSW] -- -- -- --  Financial Strain Interventions -- Intervention Not Indicated -- -- --  Physical Activity Interventions -- Other (Comments)  [Patient states she is fearful of going outside and prevents her from walking/exercising outside.] -- -- --  Stress Interventions -- Intervention Not Indicated -- -- --  Social Connections Interventions -- Intervention Not Indicated -- -- --       Care Plan  Allergies  Allergen Reactions   Celery Oil Anaphylaxis   Shellfish Allergy Anaphylaxis   Entresto [Sacubitril-Valsartan] Other (See Comments)    GI upset   Hydrocodone Other (See Comments)    Reaction happened in the spine   Latex Rash and Other (See Comments)    EKG leads cause rashes- cannot tolerate for any extended length of time    Medications Reviewed Today     Reviewed by [name], RN (Registered Nurse) on 05/25/22 at 1520  Med List Status: <None>   Medication Order Taking? Sig Documenting Provider Last Dose Status Informant  acetaminophen (TYLENOL) 500 MG tablet [id number] Yes Take 500-1,000 mg by mouth 4 (four) times daily as needed (for pain). [provider] Taking Active Self  carvedilol (COREG) 6.25 MG tablet [id number] Yes Take 1 tablet (6.25 mg total) by mouth 2 (two) times daily with a meal. [name], [name], MD Taking Active   digoxin (LANOXIN) 0.125 MG tablet [id number] Yes Take 1 tablet (0.125 mg total) by mouth daily. [name], MD Taking Active   lisinopril (ZESTRIL) 20 MG tablet [id number] Yes Take 1 tablet (20 mg total) by mouth daily. [name], MD Taking Active   methimazole (TAPAZOLE) 10 MG tablet [id number] Yes TAKE ONE TABLET BY MOUTH THREE TIMES A DAY. [name], MD Taking  Active            Med Note (Rozalia Dino A   Wed May 25, 2022  3:19 PM) Taking twice daily  Misc. Devices MISC [id number]  Blood pressure monitor.  Diagnosis hypertension [name], MD   Active   omeprazole (PRILOSEC) 20 MG capsule [id number] No Take 20 mg by mouth as needed.  Patient not taking: Reported on 05/25/2022   [provider] Not Taking Active   potassium chloride SA (KLOR-CON M) 20 MEQ tablet [id number] Yes Take 1 tablet (20 mEq total) by mouth daily. Minus Breeding, MD Taking Active Self           Med Note Stanford [place]   Fri Jan 07, 2022  1:57 PM)    spironolactone (ALDACTONE) 25 MG tablet [id number] Yes Take 1 tablet (25 mg total) by mouth daily. [name], MD Taking Active   torsemide (DEMADEX) 20 MG tablet [id number] Yes Take 2 tablets (40 mg total) by mouth daily. 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   SOB (shortness of breath) 11/03/2017   Severe mitral regurgitation 07/20/2009   CHEST PAIN 07/20/2009   Essential hypertension 06/16/2009   AGORAPHOBIA 04/22/2009   MIGRAINE HEADACHE 04/22/2009   PYELONEPHRITIS 04/22/2009   SYNCOPE 04/22/2009   DIZZINESS 04/22/2009   PALPITATIONS 04/22/2009   ELECTROCARDIOGRAM, ABNORMAL 04/22/2009    Conditions to be addressed/monitored per PCP order:  CHF, HTN, and Hyperthyroid  Care Plan : RN Care Manager Plan of Care  Updates made by [name], RN since 05/25/2022 12:00 AM     Problem: Chronic Disease Management and Care Coordination Needs for CHF, HTN, Hyperthyroidism   Priority: High  Onset Date: 11/23/2021     Long-Range Goal: Development of Plan of Care for Chronic Disease Management and Care  Coordination Needs (CHF, HTN, Hyperthyroidism)   Start Date: 11/23/2021  Expected End Date: 07/27/2022  Priority: High  Note:   Current Barriers:  Knowledge Deficits related to plan of care for management of CHF, HTN, and Hyperthyroidism  Chronic Disease Management support and education needs related to CHF, HTN, and Hyperthyroidism Lacks caregiver support.        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  RNCM Clinical Goal(s):  Patient will verbalize understanding of plan for management of CHF, HTN, and Hyperthyroidism as evidenced by improved management of these chronic diseases verbalize basic understanding of CHF, HTN, and Hyperthyroidism disease process and self health management plan as evidenced by improved management of these chronic diseases take all medications exactly as prescribed and will call provider for medication related questions as evidenced by being compliant with all prescribed medications    attend all scheduled medical appointments: 05/26/22 with [place], LCSW on 06/02/22 and HF Clinic on 06/09/22 as evidenced by attending all scheduled appointments        demonstrate improved adherence to prescribed treatment plan for CHF, HTN, and Hyperthyroidism as evidenced by improved management of these diseases. continue to work with  RN Care Manager and/or Social Worker to address care management and care coordination needs related to CHF, HTN, and Hyperthyroidism as evidenced by adherence to CM Team Scheduled appointments     through collaboration with RN Care manager, provider, and care team.   Interventions: Inter-disciplinary care team collaboration (see longitudinal plan of care) Evaluation of current treatment plan related to  self management and patient's adherence to plan as established by provider Referral to LCSW for [place] needs-scheduled on 06/02/22 @ 11 am   Heart Failure Interventions:  (Status: Goal on Track  (progressing): YES.)  Long Term Goal  Wt Readings from Last 3 Encounters:  05/10/22 234 lb 6.4 oz (106.3 kg)  01/07/22 221 lb (100.2 kg)  12/22/21 215 lb 6.4 oz (97.7 kg)  Reviewed Heart Failure Action Plan in depth and provided written copy Reviewed role of diuretics in prevention of fluid overload and management of heart failure Discussed the importance of keeping all appointments with provider Provided patient with education about the role of exercise in the management of heart failure Assessed social determinant of health barriers Reviewed providers notes, discussed visit with patient, clarified recommend Echo in February 2024  Hyperthyroidism  (Status: Goal on Track (progressing): YES.) Long Term Goal  Evaluation of current treatment plan related to  Hyperthyroidism , Financial constraints related to increased rent and having to reapply for Food Stamps (done)  self-management and patient's adherence to plan as established by provider. Discussed plans with patient for ongoing care management follow up and provided patient with direct contact information for care management team Provided education to patient re: hyperthyroidism; Reviewed medications with patient and discussed importance of medication compliance; Reviewed scheduled/upcoming provider appointments including : See appointments in [place] ; Discussed plans with patient for ongoing care management follow up and provided patient with direct contact information for care management team; Encouraged patient to take all medications to new Endocrinology appointment tomorrow   Hypertension: (Status: Goal on Track (progressing): YES.) Long Term Goal  Last practice recorded BP readings:  BP Readings from Last 3 Encounters:  05/10/22 (!) 148/96  01/07/22 110/70  12/22/21 100/68  Most recent eGFR/CrCl:  Lab Results  Component Value Date   EGFR 103 12/09/2021    No components found for: "CRCL"  Reviewed medications  with patient and discussed importance of compliance;  Counseled on the importance of exercise goals with target of 150 minutes per week Discussed plans with patient for ongoing care management follow up and provided patient with direct contact information for care management team; Reviewed scheduled/upcoming provider appointments including:  Collaborated with [place] to request BP monitor-will be delivered tomorrow 05/26/22 Encouraged patient to contact [employer] member services for wellness benefits(WW vouchers, online exercise and nutrition coaching, free Reilly fruits and vegetables)-revisited Provided information on how to check your BP  Patient Goals/Self-Care Activities: Take medications as prescribed   Call pharmacy for medication refills 3-7 days in advance of running out of medications Perform all self care activities independently  Perform IADL's (shopping, preparing meals, housekeeping, managing finances) independently Call provider office for new concerns or questions  call office if I gain more than 2 pounds in one day or 5 pounds in one week watch for swelling in feet, ankles and legs every day weigh myself daily keep all doctor appointments take medications for blood pressure exactly as prescribed       Follow Up:  Patient agrees to Care Plan and Follow-up.  Plan: The Managed Medicaid care management  team will reach out to the patient again over the next 60 days.  Date/time of next scheduled RN care management/care coordination outreach:  07/26/21 @ 10:30 am  [name] RN, [place] RN Care Coordinator

## 2022-05-25 NOTE — Patient Instructions (Signed)
Visit Information  Ms. Sarah Reilly was given information about Medicaid Managed Care team care coordination services as a part of their Healthy [facility] Medicaid benefit. Sarah Reilly verbally consented to engagement with the [facility] Managed Care team.   If you are experiencing a medical emergency, please call 911 or report to your local emergency department or urgent care.   If you have a non-emergency medical problem during routine business hours, please contact your provider's office and ask to speak with a nurse.   For questions related to your Healthy [facility] health plan, please call: [phone] or visit the homepage here: [email or web address]  If you would like to schedule transportation through your Healthy [facility] plan, please call the following number at least 2 days in advance of your appointment: [phone]  For information about your ride after you set it up, call Ride Assist at [phone]. Use this number to activate a Will Call pickup, or if your transportation is late for a scheduled pickup. Use this number, too, if you need to make a change or cancel a previously scheduled reservation.  If you need transportation services right away, call [phone]. The after-hours call center is staffed 24 hours to handle ride assistance and urgent reservation requests (including discharges) 365 days a year. Urgent trips include sick visits, hospital discharge requests and life-sustaining treatment.  Call the [place] at [phone], at any time, 24 hours a day, 7 days a week. If you are in danger or need immediate medical attention call 911.  If you would like help to quit smoking, call 1-800-QUIT-NOW 854-520-9104) OR Espaol: 1-855-Djelo-Ya (6-967-893-8101) o para ms informacin haga clic aqu or Text READY to 200-400 to register via text  Ms. Sarah Reilly,   Please see education materials related to BP and HF  provided by MyChart link.  Patient verbalizes understanding of instructions and care plan provided today and agrees to view in [place]. Active MyChart status and patient understanding of how to access instructions and care plan via MyChart confirmed with patient.     Telephone follow up appointment with Managed Medicaid care management team member scheduled for:07/26/22 @ 10:30am  Sarah Joiner RN, BSN [place] RN Care Coordinator   Following is a copy of your plan of care:  Care Plan : RN Care Manager Plan of Care  Updates made by [name], RN since 05/25/2022 12:00 AM     Problem: Chronic Disease Management and Care Coordination Needs for CHF, HTN, Hyperthyroidism   Priority: High  Onset Date: 11/23/2021     Long-Range Goal: Development of Plan of Care for Chronic Disease Management and Care Coordination Needs (CHF, HTN, Hyperthyroidism)   Start Date: 11/23/2021  Expected End Date: 07/27/2022  Priority: High  Note:   Current Barriers:  Knowledge Deficits related to plan of care for management of CHF, HTN, and Hyperthyroidism  Chronic Disease Management support and education needs related to CHF, HTN, and Hyperthyroidism Lacks caregiver support.        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  RNCM Clinical Goal(s):  Patient will verbalize understanding of plan for management of CHF, HTN, and Hyperthyroidism as evidenced by improved management of these chronic diseases verbalize basic understanding of CHF, HTN, and Hyperthyroidism disease process and self health management plan as evidenced by improved management of these chronic diseases take all medications exactly as prescribed and will call provider for medication related questions as evidenced by being compliant with all prescribed  medications    attend all scheduled medical appointments: 05/26/22 with [place], LCSW on 06/02/22 and [facility] Clinic on  06/09/22 as evidenced by attending all scheduled appointments        demonstrate improved adherence to prescribed treatment plan for CHF, HTN, and Hyperthyroidism as evidenced by improved management of these diseases. continue to work with Consulting civil engineer and/or Social Worker to address care management and care coordination needs related to CHF, HTN, and Hyperthyroidism as evidenced by adherence to CM Team Scheduled appointments     through collaboration with RN Care manager, provider, and care team.   Interventions: Inter-disciplinary care team collaboration (see longitudinal plan of care) Evaluation of current treatment plan related to  self management and patient's adherence to plan as established by provider Referral to LCSW for [place] needs-scheduled on 06/02/22 @ 11 am   Heart Failure Interventions:  (Status: Goal on Track (progressing): YES.)  Long Term Goal  Wt Readings from Last 3 Encounters:  05/10/22 234 lb 6.4 oz (106.3 kg)  01/07/22 221 lb (100.2 kg)  12/22/21 215 lb 6.4 oz (97.7 kg)  Reviewed Heart Failure Action Plan in depth and provided written copy Reviewed role of diuretics in prevention of fluid overload and management of heart failure Discussed the importance of keeping all appointments with provider Provided patient with education about the role of exercise in the management of heart failure Assessed social determinant of health barriers Reviewed providers notes, discussed visit with patient, clarified recommend Echo in February 2024  Hyperthyroidism  (Status: Goal on Track (progressing): YES.) Long Term Goal  Evaluation of current treatment plan related to  Hyperthyroidism , Financial constraints related to increased rent and having to reapply for Food Stamps (done)  self-management and patient's adherence to plan as established by provider. Discussed plans with patient for ongoing care management follow up and provided patient with direct contact information for  care management team Provided education to patient re: hyperthyroidism; Reviewed medications with patient and discussed importance of medication compliance; Reviewed scheduled/upcoming provider appointments including : See appointments in [place] ; Discussed plans with patient for ongoing care management follow up and provided patient with direct contact information for care management team; Encouraged patient to take all medications to new Endocrinology appointment tomorrow   Hypertension: (Status: Goal on Track (progressing): YES.) Long Term Goal  Last practice recorded BP readings:  BP Readings from Last 3 Encounters:  05/10/22 (!) 148/96  01/07/22 110/70  12/22/21 100/68  Most recent eGFR/CrCl:  Lab Results  Component Value Date   EGFR 103 12/09/2021    No components found for: "CRCL"  Reviewed medications with patient and discussed importance of compliance;  Counseled on the importance of exercise goals with target of 150 minutes per week Discussed plans with patient for ongoing care management follow up and provided patient with direct contact information for care management team; Reviewed scheduled/upcoming provider appointments including:  Collaborated with [place] to request BP monitor-will be delivered tomorrow 05/26/22 Encouraged patient to contact [employer] member services for wellness benefits(WW vouchers, online exercise and nutrition coaching, free [place] fruits and vegetables)-revisited Provided information on how to check your BP  Patient Goals/Self-Care Activities: Take medications as prescribed   Call pharmacy for medication refills 3-7 days in advance of running out of medications Perform all self care activities independently  Perform IADL's (shopping, preparing meals, housekeeping, managing finances) independently Call provider office for new concerns or questions  call office if I gain more  than 2 pounds in one day or 5 pounds in one  week watch for swelling in feet, ankles and legs every day weigh myself daily keep all doctor appointments take medications for blood pressure exactly as prescribed

## 2022-05-26 DIAGNOSIS — H5789 Other specified disorders of eye and adnexa: Secondary | ICD-10-CM | POA: Diagnosis not present

## 2022-05-26 DIAGNOSIS — E0500 Thyrotoxicosis with diffuse goiter without thyrotoxic crisis or storm: Secondary | ICD-10-CM | POA: Diagnosis not present

## 2022-05-26 DIAGNOSIS — I509 Heart failure, unspecified: Secondary | ICD-10-CM | POA: Diagnosis not present

## 2022-05-26 DIAGNOSIS — R1310 Dysphagia, unspecified: Secondary | ICD-10-CM | POA: Diagnosis not present

## 2022-05-26 DIAGNOSIS — E079 Disorder of thyroid, unspecified: Secondary | ICD-10-CM | POA: Diagnosis not present

## 2022-06-02 ENCOUNTER — Other Ambulatory Visit: Payer: Medicaid Other | Admitting: Licensed Clinical Social Worker

## 2022-06-02 DIAGNOSIS — F411 Generalized anxiety disorder: Secondary | ICD-10-CM

## 2022-06-02 NOTE — Patient Instructions (Signed)
Visit Information  Sarah Reilly was given information about Medicaid Managed Care team care coordination services as a part of their Healthy [facility] Medicaid benefit. Sarah Reilly verbally consented to engagement with the [facility] Managed Care team.   If you are experiencing a medical emergency, please call 911 or report to your local emergency department or urgent care.   If you have a non-emergency medical problem during routine business hours, please contact your provider's office and ask to speak with a nurse.   For questions related to your Healthy [facility] health plan, please call: [phone] or visit the homepage here: [email or web address]  If you would like to schedule transportation through your Healthy [facility] plan, please call the following number at least 2 days in advance of your appointment: [phone]  For information about your ride after you set it up, call Ride Assist at [phone]. Use this number to activate a Will Call pickup, or if your transportation is late for a scheduled pickup. Use this number, too, if you need to make a change or cancel a previously scheduled reservation.  If you need transportation services right away, call [phone]. The after-hours call center is staffed 24 hours to handle ride assistance and urgent reservation requests (including discharges) 365 days a year. Urgent trips include sick visits, hospital discharge requests and life-sustaining treatment.  Call the [place] at [phone], at any time, 24 hours a day, 7 days a week. If you are in danger or need immediate medical attention call 911.  If you would like help to quit smoking, call 1-800-QUIT-NOW (912) 262-7509) OR Espaol: 1-855-Djelo-Ya (1-950-932-6712) o para ms informacin haga clic aqu or Text READY to 200-400 to register via text   Following is a copy of your plan of care:  Care Plan : LCSW Plan of Care   Updates made by Sarah Cutter, LCSW since 06/02/2022 12:00 AM     Problem: Anxiety Identification (Anxiety)      Long-Range Goal: Anxiety Symptoms Identified   Start Date: 06/02/2022  Priority: High  Note:   Priority: High  Timeframe:  Long-Range Goal Priority:  High Start Date:   06/02/22               Expected End Date:  ongoing                     Follow Up Date--06/15/22 at 1  - keep 90 percent of scheduled appointments -consider counseling or psychiatry -consider bumping up your self-care  -consider creating a stronger support network   Why is this important?             Combatting depression may take some time.            If you don't feel better right away, don't give up on your treatment plan.    Current barriers:   Chronic Mental Health needs related to depression, stress and anxiety. Patient requires Support, Education, Resources, Referrals, Advocacy, and Care Coordination, in order to meet Unmet Mental Health Needs. Patient will implement clinical interventions discussed today to decrease symptoms of depression and increase knowledge and/or ability of: coping skills. Mental Health Concerns and Social Isolation Patient lacks knowledge of available community counseling agencies and resources.  Clinical Goal(s): verbalize understanding of plan for management of Anxiety, Depression, and Stress and demonstrate a reduction in symptoms. Patient will connect with a provider for ongoing mental health treatment, increase coping skills, healthy habits, self-management skills, and stress reduction  Patient Goals/Self-Care Activities: Over the next 120 days Attend scheduled medical appointments Utilize healthy coping skills and supportive resources discussed Contact PCP with any questions or concerns Keep 90 percent of counseling appointments Call your insurance provider for more information about your Enhanced Benefits  Check out counseling resources provided  Begin  personal counseling with LCSW, to reduce and manage symptoms of Depression and Stress, until well-established with mental health provider Accept all calls from representative with [facility] in an effort to establish ongoing mental health counseling and supportive services. Incorporate into daily practice - relaxation techniques, deep breathing exercises, and mindfulness meditation strategies. Talk about feelings with friends, family members, spiritual advisor, etc. Contact LCSW directly [phone]), if you have questions, need assistance, or if additional social work needs are identified between now and our next scheduled telephone outreach call. Call 988 for mental health hotline/crisis line if needed (24/7 available) Try techniques to reduce symptoms of anxiety/negative thinking (deep breathing, distraction, positive self talk, etc)  - develop a personal safety plan - develop a plan to deal with triggers like holidays, anniversaries - exercise at least 2 to 3 times per week - have a plan for how to handle bad days - journal feelings and what helps to feel better or worse - spend time or talk with others at least 2 to 3 times per week - watch for early signs of feeling worse - begin personal counseling - call and visit an old friend - check out volunteer opportunities - join a support group - laugh; watch a funny movie or comedian - learn and use visualization or guided imagery - perform a random act of kindness - practice relaxation or meditation daily - start or continue a personal journal - practice positive thinking and self-talk -continue with compliance of taking medication  -identify current effective and ineffective coping strategies.  -implement positive self-talk in care to increase self-esteem, confidence and feelings of control.  -consider alternative and complementary therapy approaches such as meditation, mindfulness or yoga.  -journaling, prayer, worship services,  meditation or pastoral counseling.  -increase participation in pleasurable group activities such as hobbies, singing, sports or volunteering).  -consider the use of meditative movement therapy such as tai chi, yoga or qigong.  -start a regular daily exercise program based on tolerance, ability and patient choice to support positive thinking and activity    If you are experiencing a Mental Health or [place] or need someone to talk to, please call the Suicide and Crisis Lifeline: 988    Patient Goals: Initial goal

## 2022-06-08 ENCOUNTER — Telehealth (HOSPITAL_COMMUNITY): Payer: Self-pay | Admitting: Licensed Clinical Social Worker

## 2022-06-08 NOTE — Telephone Encounter (Signed)
H&V Care Navigation CSW Progress Note  Clinical Social Worker received call that pt needing assistance with transportation to appt tomorrow.  Normally uses medicaid but system was down.  CSW able to arrange uber through [employer].  Patient is participating in a Managed Medicaid Plan:  Yes  SDOH Screenings   Food Insecurity: Food Insecurity Present (11/23/2021)  Housing: Low Risk  (11/23/2021)  Transportation Needs: Unmet Transportation Needs (06/08/2022)  Alcohol Screen: Low Risk  (02/25/2022)  Depression (PHQ2-9): High Risk (06/02/2022)  Financial Resource Strain: High Risk (11/23/2021)  Physical Activity: Inactive (11/23/2021)  Social Connections: Unknown (11/23/2021)  Stress: Stress Concern Present (11/23/2021)  Tobacco Use: Medium Risk (05/25/2022)    Burna Sis, LCSW Clinical Social Worker [facility] Desk#: [phone] Cell#: [phone]

## 2022-06-08 NOTE — Progress Notes (Signed)
Advanced Heart Failure Clinic Note  PCP: [name], MD Endocrinology: Dr Dwyane Dee Primary Cardiologist: Dr Percival Spanish  HF Cardiologist: Dr Haroldine Laws   HPI: Sarah Reilly is a 41 y.o. 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    She developed HF after the birth of her 65th child in 2010. EF 20%. Did not have a heart cath.  She then moved back to [place] to be by her mother. Apparently EF dropped to 15-20% for a period. They suggested ICD but she refused. She returned to [place].  In July 2019 she was diagnosed with hyperthyroidism. Started on methimazole by Dr Dwyane Dee.   Previously she was having CP in setting of 30 pound weight gain. Dr. Haroldine Laws suggested R/L cath but she refused saying she wanted to try and lose weight first and see how she felt. We eventually rescheduled but she couldn't get COVID test.   She was seen in [facility] 8/21 and was quite tachycardic. She had not seen Dr. Dwyane Dee since 12/20. Ran out of methimazole refils. 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  TSH < 0.010. She has not followed up with Endo. On the chart there is a dismissal letter from Dr. Dwyane Dee dated 02/27/20.  Echo 04/02/20 EF 35-40%, RV normal. Mod-severe MR w/ tethering of posterior leaflet.   Echo 9/22 EF 30-35% RV mildly. Bi atrial enlargement.    Saw Dr Percival Spanish 11/05/21 with increased shortness of breath, orthopnea, and PND. Admitted with A/C HFrEF. TSH 0.005. Echo EF 25-30%. Diuresed with IV lasix. cMRI showed LVEF 25%, RV reduced, no LGE. No clear etiology. GDMT titrated. No Entresto d/t intolerance, started on ACE. Discharged home, weight 210 lbs.  Today she returns for HF follow up. Overall feeling fair. She has chest tightness and fatigue. Main complaint is right flank pain, worse when she lays back. She has SOB  walking up stairs. Has more abdominal fullness. Continues with sharp atypical chest pain, stretching her arms helps the pain. Denies abnormal bleeding, palpitations, dizziness, or PND/Orthopnea. Appetite ok. No fever or chills. Weight at home 230 pounds.Only taking torsemide 40 mg once a week for the past 2 months.    Cardiac Studies: - cMRI (5/23): LVEF 25%, RVEF 30%, no LGE, moderate MR and TR.  - Echo (5/23): EF 25-30%, severe LV dysfunction with global HK, RV mildly down, mild to moderate MR, moderate TR  - Echo (03/09/21): EF 30-35%, RV mildly reduced, severe MR, mild-mod TR   - Echo (04/02/20): EF 35-40%   - Echo (2/21): LV dilated/spherical 30-35% likely severe MR. RV ok   - Echo (12/2018): EF 35-40% Mod-Severe MR, RV normal  - Echo (11/16/2017): EF 20-25% Severe MR   ROS: All systems negative except as listed in HPI, PMH and Problem List.  SH:  Social History   Socioeconomic History   Marital status: Single    Spouse name: Not on file   Number of children: Not on file   Years of education: Not on file   Highest education level: Not on file  Occupational History   Not on file  Tobacco Use   Smoking status: Former   Smokeless tobacco: Never  Substance and Sexual Activity   Alcohol use: Yes    Alcohol/week: 4.0 standard drinks of alcohol    Types: 3 Glasses of wine, 1 Standard drinks or  equivalent per week    Comment: Occassional   Drug use: Never   Sexual activity: Not on file  Other Topics Concern   Not on file  Social History Narrative   She has 4 children ages 38 and under.  She stays at home. She does not smoke cigarettes or drink alcohol. Does not get regular exercise.    Social Determinants of Health   Financial Resource Strain: High Risk (11/23/2021)   Overall Financial Resource Strain (CARDIA)    Difficulty of Paying Living Expenses: Hard  Food Insecurity: Food Insecurity Present (11/23/2021)   Hunger Vital Sign    Worried About Running Out of Food in the  Last Year: Sometimes true    Ran Out of Food in the Last Year: Sometimes true  Transportation Needs: Unmet Transportation Needs (06/08/2022)   PRAPARE - Administrator, Civil Service (Medical): Yes    Lack of Transportation (Non-Medical): Yes  Physical Activity: Inactive (11/23/2021)   Exercise Vital Sign    Days of Exercise per Week: 0 days    Minutes of Exercise per Session: 0 min  Stress: Stress Concern Present (11/23/2021)   [employer] of Occupational Health - Occupational Stress Questionnaire    Feeling of Stress : To some extent  Social Connections: Unknown (11/23/2021)   Social Connection and Isolation Panel [NHANES]    Frequency of Communication with Friends and Family: More than three times a week    Frequency of Social Gatherings with Friends and Family: More than three times a week    Attends Religious Services: Never    Database administrator or Organizations: No    Attends Engineer, structural: Never    Marital Status: Not on file  Intimate Partner Violence: Not on file   FH:  Family History  Problem Relation Age of Onset   Heart disease Mother        No details   Hypertension Mother    Diabetes Mother    Hypertension Father    Heart murmur Sister    Past Medical History:  Diagnosis Date   Cardiomyopathy    CHF (congestive heart failure) (HCC)    HTN (hypertension)    Mitral regurgitation    Current Outpatient Medications  Medication Sig Dispense Refill   acetaminophen (TYLENOL) 500 MG tablet Take 500-1,000 mg by mouth 4 (four) times daily as needed (for pain).     carvedilol (COREG) 6.25 MG tablet Take 1 tablet (6.25 mg total) by mouth 2 (two) times daily with a meal. 60 tablet 6   digoxin (LANOXIN) 0.125 MG tablet Take 1 tablet (0.125 mg total) by mouth daily. 30 tablet 6   lisinopril (ZESTRIL) 20 MG tablet Take 1 tablet (20 mg total) by mouth daily. 30 tablet 3   methimazole (TAPAZOLE) 10 MG tablet TAKE ONE TABLET BY MOUTH THREE  TIMES A DAY. 90 tablet 0   Misc. Devices MISC Blood pressure monitor.  Diagnosis hypertension 1 each 0   omeprazole (PRILOSEC) 20 MG capsule Take 20 mg by mouth as needed. (Patient not taking: Reported on 05/25/2022)     potassium chloride SA (KLOR-CON M) 20 MEQ tablet Take 1 tablet (20 mEq total) by mouth daily. 90 tablet 3   spironolactone (ALDACTONE) 25 MG tablet Take 1 tablet (25 mg total) by mouth daily. 30 tablet 3   torsemide (DEMADEX) 20 MG tablet Take 2 tablets (40 mg total) by mouth daily. 30 tablet 7   No current facility-administered medications for  this visit.   There were no vitals taken for this visit.  Wt Readings from Last 3 Encounters:  05/10/22 106.3 kg (234 lb 6.4 oz)  01/07/22 100.2 kg (221 lb)  12/22/21 97.7 kg (215 lb 6.4 oz)   PHYSICAL EXAM: General:  NAD. No resp difficulty HEENT: Normal Neck: Supple. JVP to jaw. Carotids 2+ bilat; no bruits. No lymphadenopathy or thryomegaly appreciated. Cor: PMI nondisplaced. Regular rate & rhythm. No rubs, gallops, 3/6 MR Lungs: Clear Abdomen: Soft, nontender, nondistended. No hepatosplenomegaly. No bruits or masses. Good bowel sounds. Extremities: No cyanosis, clubbing, rash, 1+ BLE edema Neuro: Alert & oriented x 3, cranial nerves grossly intact. Moves all 4 extremities w/o difficulty. Affect pleasant.  ReDs: 46%  ASSESSMENT & PLAN: Chronic Systolic Heart Failure - Onset in 2010. 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 - Echo (5/19): EF 20-25% with severe posterior MR.   - Echo (7/20): EF 35-40% mod-severe MR with restricted posterior leaflet  - Echo (2/21): EF 30-35% mod-severe MR RV ok  - Echo (10/21): EF 35-40% moderate to severe MR  - Echo (9/22): EF 30-35%, RV mildly reduced, Severe MR, Mild TR  - Echo (5/23): EF 25-30%, severe LV dilation, mildly decreased RV function, severe LAE, moderate-severe MR with thickened mitral valve, moderate TR, IVC dilated.  - cMRI (5/23):  LVEF 25% RVEF 30%, No LGE. - NYHA III, volume up today, weight up 13 lbs. ReDs 46% - Restart torsemide 40 mg daily.  - Continue lisinopril 20 mg daily (no Entresto due to GI upset). - Continue spironolactone 25 mg daily.  - Continue digoxin 0.125 mg daily. - Continue carvedilol 6.25 mg bid  - No SGLT2i d/t multiple episodes of pyelonephritis in past. Denies recent GU symptoms.  - Not interested in ICD. - BMET, BNP and digoxin level today. Repeat BMET in 10 days.   2. Hyperthyroidism  - Previously on methimazole and followed by Dr Dwyane Dee.  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   - She was dismissed from [facility] Endocrinology.   - TSH <0.010, free T4 high.  - Continue methimazole 20 mg tid.  - Off prednisone due to stomach upset  - Continue beta blocker.  - She has Endocrine follow up at [facility].   3. Mitral regurgitation  - Moderate-severe on echo 5/23.  With severe LV and LA dilation, suspect component of functional MR but the mitral valve is also thickened towards the tips.   - She is not inclined to invasive management/evaluation, hopefully MR will improve with medical management.  - Repeat echo in 3 months.   Follow up ***   Pessy Delamar N, PA-C  5:03 PM

## 2022-06-09 ENCOUNTER — Ambulatory Visit (HOSPITAL_COMMUNITY)
Admission: RE | Admit: 2022-06-09 | Discharge: 2022-06-09 | Disposition: A | Discharge status: Home / Self Care | Payer: Medicaid Other | Source: Ambulatory Visit | Attending: Physician Assistant | Admitting: Physician Assistant

## 2022-06-09 VITALS — BP 130/82 | HR 95 | Wt 224.0 lb

## 2022-06-09 DIAGNOSIS — I5022 Chronic systolic (congestive) heart failure: Secondary | ICD-10-CM | POA: Diagnosis not present

## 2022-06-09 DIAGNOSIS — E669 Obesity, unspecified: Secondary | ICD-10-CM | POA: Insufficient documentation

## 2022-06-09 DIAGNOSIS — I429 Cardiomyopathy, unspecified: Secondary | ICD-10-CM | POA: Diagnosis not present

## 2022-06-09 DIAGNOSIS — E0590 Thyrotoxicosis, unspecified without thyrotoxic crisis or storm: Secondary | ICD-10-CM | POA: Diagnosis not present

## 2022-06-09 DIAGNOSIS — J029 Acute pharyngitis, unspecified: Secondary | ICD-10-CM | POA: Insufficient documentation

## 2022-06-09 DIAGNOSIS — Z79899 Other long term (current) drug therapy: Secondary | ICD-10-CM | POA: Diagnosis not present

## 2022-06-09 DIAGNOSIS — I340 Nonrheumatic mitral (valve) insufficiency: Secondary | ICD-10-CM | POA: Diagnosis not present

## 2022-06-09 DIAGNOSIS — I110 Hypertensive heart disease with heart failure: Secondary | ICD-10-CM | POA: Insufficient documentation

## 2022-06-09 DIAGNOSIS — R059 Cough, unspecified: Secondary | ICD-10-CM | POA: Diagnosis not present

## 2022-06-09 DIAGNOSIS — I428 Other cardiomyopathies: Secondary | ICD-10-CM

## 2022-06-09 DIAGNOSIS — R509 Fever, unspecified: Secondary | ICD-10-CM | POA: Insufficient documentation

## 2022-06-09 LAB — BASIC METABOLIC PANEL
Anion gap: 10 (ref 5–15)
BUN: 14 mg/dL (ref 6–20)
CO2: 23 mmol/L (ref 22–32)
Calcium: 9.2 mg/dL (ref 8.9–10.3)
Chloride: 99 mmol/L (ref 98–111)
Creatinine, Ser: 0.82 mg/dL (ref 0.44–1.00)
GFR, Estimated: >60 mL/min (ref >60)
Glucose, Bld: 141 mg/dL — ABNORMAL HIGH (ref 70–99)
Potassium: 3.5 mmol/L (ref 3.5–5.1)
Sodium: 132 mmol/L — ABNORMAL LOW (ref 135–145)

## 2022-06-09 LAB — BRAIN NATRIURETIC PEPTIDE: B Natriuretic Peptide: 32.3 pg/mL (ref 0.0–100.0)

## 2022-06-09 NOTE — Patient Instructions (Addendum)
PLEASE take an additional 20 mg of Torsemide for 2 days  Your physician recommends that you schedule a follow-up appointment in: 2 months with Dr Gala Romney and echo  Your physician has requested that you have an echocardiogram. Echocardiography is a painless test that uses sound waves to create images of your heart. It provides your doctor with information about the size and shape of your heart and how well your heart's chambers and valves are working. This procedure takes approximately one hour. There are no restrictions for this procedure. Please do NOT wear cologne, perfume, aftershave, or lotions (deodorant is allowed). Please arrive 15 minutes prior to your appointment time.   Do the following things EVERYDAY: Weigh yourself in the morning before breakfast. Write it down and keep it in a log. Take your medicines as prescribed Eat low salt foods--Limit salt (sodium) to 2000 mg per day.  Stay as active as you can everyday Limit all fluids for the day to less than 2 liters  At the Advanced Heart Failure Clinic, you and your health needs are our priority. As part of our continuing mission to provide you with exceptional heart care, we have created designated Provider Care Teams. These Care Teams include your primary Cardiologist (physician) and Advanced Practice Providers (APPs- Physician Assistants and Nurse Practitioners) who all work together to provide you with the care you need, when you need it.   You may see any of the following providers on your designated Care Team at your next follow up: Dr Arvilla Meres Dr Marca Ancona Dr. Marcos Eke, NP Robbie Lis, [place] [facility] [place], [place] Brynda Peon, NP Karle Plumber, PharmD   Please be sure to bring in all your medications bottles to every appointment.   If you have any questions or concerns before your next appointment please send [place] a message through [place] or call our office at [phone].     TO LEAVE A MESSAGE FOR THE NURSE SELECT OPTION 2, PLEASE LEAVE A MESSAGE INCLUDING: YOUR NAME DATE OF BIRTH CALL BACK NUMBER REASON FOR CALL**this is important as we prioritize the call backs  YOU WILL RECEIVE A CALL BACK THE SAME DAY AS LONG AS YOU CALL BEFORE 4:00 PM

## 2022-06-09 NOTE — Progress Notes (Signed)
ReDS Vest / Clip - 06/09/22 1200       ReDS Vest / Clip   Station Marker C    Ruler Value 25    ReDS Value Range Moderate volume overload    ReDS Actual Value 39

## 2022-06-10 ENCOUNTER — Telehealth (HOSPITAL_COMMUNITY): Payer: Self-pay

## 2022-06-10 NOTE — Telephone Encounter (Signed)
Patient aware and agreeable. She said she was taking her Potassium.

## 2022-06-15 ENCOUNTER — Ambulatory Visit: Payer: Medicaid Other | Admitting: Licensed Clinical Social Worker

## 2022-06-24 DIAGNOSIS — I10 Essential (primary) hypertension: Secondary | ICD-10-CM | POA: Diagnosis not present

## 2022-07-21 ENCOUNTER — Other Ambulatory Visit: Payer: Self-pay | Admitting: Family Medicine

## 2022-07-21 DIAGNOSIS — I5042 Chronic combined systolic (congestive) and diastolic (congestive) heart failure: Secondary | ICD-10-CM

## 2022-07-22 NOTE — Telephone Encounter (Signed)
Patient called, left VM to return the call to the office to schedule OV. Noted to return October, no visit scheduled.

## 2022-07-22 NOTE — Telephone Encounter (Signed)
Courtesy refill given, appointment needed.   Requested Prescriptions  Pending Prescriptions Disp Refills   spironolactone (ALDACTONE) 25 MG tablet [Pharmacy Med Name: SPIRONOLACTONE 25 MG ORAL TABLET] 30 tablet 0    Sig: TAKE ONE TABLET BY MOUTH ONCE DAILY     Cardiovascular: Diuretics - Aldosterone Antagonist Failed - 07/21/2022  4:04 PM      Failed - Na in normal range and within 180 days    Sodium  Date Value Ref Range Status  06/09/2022 132 (L) 135 - 145 mmol/L Final  12/09/2021 134 134 - 144 mmol/L Final         Failed - Valid encounter within last 6 months    Recent Outpatient Visits           7 months ago Abdominal discomfort   [place], [name], MD   3 years ago Hyperthyroidism   [place], [name], MD              Passed - Cr in normal range and within 180 days    Creatinine, Ser  Date Value Ref Range Status  06/09/2022 0.82 0.44 - 1.00 mg/dL Final         Passed - K in normal range and within 180 days    Potassium  Date Value Ref Range Status  06/09/2022 3.5 3.5 - 5.1 mmol/L Final         Passed - eGFR is 30 or above and within 180 days    GFR calc Af Amer  Date Value Ref Range Status  02/10/2020 >60 >60 mL/min Final   GFR, Estimated  Date Value Ref Range Status  06/09/2022 >60 >60 mL/min Final    Comment:    (NOTE) Calculated using the CKD-EPI Creatinine Equation (2021)    eGFR  Date Value Ref Range Status  12/09/2021 103 >59 mL/min/1.73 Final         Passed - Last BP in normal range    BP Readings from Last 1 Encounters:  06/09/22 130/82

## 2022-07-26 ENCOUNTER — Other Ambulatory Visit: Payer: Medicaid Other | Admitting: *Deleted

## 2022-07-26 ENCOUNTER — Encounter: Payer: Self-pay | Admitting: *Deleted

## 2022-07-26 NOTE — Patient Instructions (Signed)
Visit Information  Sarah Reilly was given information about Medicaid Managed Care team care coordination services as a part of their Healthy [facility] Medicaid benefit. [name] verbally consented to engagement with the [facility] Managed Care team.   If you are experiencing a medical emergency, please call 911 or report to your local emergency department or urgent care.   If you have a non-emergency medical problem during routine business hours, please contact your provider's office and ask to speak with a nurse.   For questions related to your Healthy [facility] health plan, please call: [phone] or visit the homepage here: [email or web address]  If you would like to schedule transportation through your Healthy [facility] plan, please call the following number at least 2 days in advance of your appointment: [phone]  For information about your ride after you set it up, call Ride Assist at [phone]. Use this number to activate a Will Call pickup, or if your transportation is late for a scheduled pickup. Use this number, too, if you need to make a change or cancel a previously scheduled reservation.  If you need transportation services right away, call [phone]. The after-hours call center is staffed 24 hours to handle ride assistance and urgent reservation requests (including discharges) 365 days a year. Urgent trips include sick visits, hospital discharge requests and life-sustaining treatment.  Call the [place] at [phone], at any time, 24 hours a day, 7 days a week. If you are in danger or need immediate medical attention call 911.  If you would like help to quit smoking, call 1-800-QUIT-NOW (214)409-5790) OR Espaol: 1-855-Djelo-Ya (6-144-315-4008) o para ms informacin haga clic aqu or Text READY to 200-400 to register via text  Sarah Reilly,   Please see education materials related to HF provided by  MyChart link.  Patient verbalizes understanding of instructions and care plan provided today and agrees to view in [place]. Active MyChart status and patient understanding of how to access instructions and care plan via MyChart confirmed with patient.     Telephone follow up appointment with Managed Medicaid care management team member scheduled for:08/26/22 @ 10:30am  [name] RN, [place] RN Care Coordinator [phone]  Following is a copy of your plan of care:  Care Plan : RN Care Manager Plan of Care  Updates made by [name], RN since 07/26/2022 12:00 AM     Problem: Chronic Disease Management and Care Coordination Needs for CHF, HTN, Hyperthyroidism   Priority: High  Onset Date: 11/23/2021     Long-Range Goal: Development of Plan of Care for Chronic Disease Management and Care Coordination Needs (CHF, HTN, Hyperthyroidism)   Start Date: 11/23/2021  Expected End Date: 07/27/2022  Priority: High  Note:    Current Barriers:  Knowledge Deficits related to plan of care for management of CHF, HTN, and Hyperthyroidism  Chronic Disease Management support and education needs related to CHF, HTN, and Hyperthyroidism Lacks caregiver support.        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  RNCM Clinical Goal(s):  Patient will verbalize understanding of plan for management of CHF, HTN, and Hyperthyroidism as evidenced by improved management of these chronic diseases verbalize basic understanding of CHF, HTN, and Hyperthyroidism disease process and self health management plan as evidenced by improved management of these chronic diseases take all medications exactly as prescribed and will call provider for medication related questions as evidenced by being compliant with all prescribed medications  attend all scheduled medical appointments: 08/18/22 with HF Clinic and Echocardiology as evidenced by attending all  scheduled appointments        demonstrate improved adherence to prescribed treatment plan for CHF, HTN, and Hyperthyroidism as evidenced by improved management of these diseases. continue to work with Consulting civil engineer and/or Social Worker to address care management and care coordination needs related to CHF, HTN, and Hyperthyroidism as evidenced by adherence to CM Team Scheduled appointments     through collaboration with Consulting civil engineer, provider, and care team.   Interventions: Inter-disciplinary care team collaboration (see longitudinal plan of care) Evaluation of current treatment plan related to  self management and patient's adherence to plan as established by provider Reviewed LCSW documentation, patient denies needs and declines rescheduling at this time BSW referral for assistance with housing and utility resources  Heart Failure Interventions:  (Status: Goal on Track (progressing): YES.)  Long Term Goal  Wt Readings from Last 3 Encounters:  06/09/22 224 lb (101.6 kg)  05/10/22 234 lb 6.4 oz (106.3 kg)  01/07/22 221 lb (100.2 kg)  Reviewed Heart Failure Action Plan in depth and provided written copy Reviewed role of diuretics in prevention of fluid overload and management of heart failure Discussed the importance of keeping all appointments with provider Provided patient with education about the role of exercise in the management of heart failure Assessed social determinant of health barriers Reviewed providers notes Advised patient to call today to arrange transportation to appointment on 08/18/22  Hyperthyroidism  (Status: Goal on Track (progressing): YES.) Long Term Goal  Evaluation of current treatment plan related to  Hyperthyroidism , Financial constraints related to increased rent and having to reapply for Food Stamps (done)  self-management and patient's adherence to plan as established by provider. Discussed plans with patient for ongoing care management follow up and  provided patient with direct contact information for care management team Provided education to patient re: hyperthyroidism; Reviewed medications with patient and discussed importance of medication compliance; Reviewed scheduled/upcoming provider appointments including : See appointments in [place] ; Reviewed provider documentation from Endocrinology and discussed with patient, follow up in May 2024   Hypertension: (Status: Goal on Track (progressing): YES.) Long Term Goal Checking BP at home, reports good readings, unable to recall last reading Last practice recorded BP readings:  BP Readings from Last 3 Encounters:  06/09/22 130/82  05/10/22 (!) 148/96  01/07/22 110/70  Most recent eGFR/CrCl:  Lab Results  Component Value Date   EGFR 103 12/09/2021    No components found for: "CRCL"  Reviewed medications with patient and discussed importance of compliance;  Counseled on the importance of exercise goals with target of 150 minutes per week Discussed plans with patient for ongoing care management follow up and provided patient with direct contact information for care management team; Reviewed scheduled/upcoming provider appointments including: 08/18/22 for Echocardiogram and HF Clinic Encouraged patient to contact [employer] member services for wellness benefits(WW vouchers, online exercise and nutrition coaching, free fresh fruits and vegetables)-revisited   Patient Goals/Self-Care Activities: Take medications as prescribed   Call pharmacy for medication refills 3-7 days in advance of running out of medications Perform all self care activities independently  Perform IADL's (shopping, preparing meals, housekeeping, managing finances) independently Call provider office for new concerns or questions  call office if I gain more than 2 pounds in one day or 5 pounds in one week watch for swelling in feet, ankles and legs every day weigh myself  daily keep all doctor  appointments take medications for blood pressure exactly as prescribed

## 2022-07-26 NOTE — Patient Outreach (Signed)
Medicaid Managed Care   Nurse Care Manager Note  07/26/2022 Name:  Sarah Reilly MRN:  [id number] DOB:  [birth year]  Sarah Reilly is an 42 y.o. year old female who is a primary patient of [name], MD.  The [facility] Managed Care Coordination team was consulted for assistance with:    CHF Thyroid disorder  Ms. Chumney was given information about Medicaid Managed Care Coordination team services today. [name] Patient agreed to services and verbal consent obtained.  Engaged with patient by telephone for follow up visit in response to provider referral for case management and/or care coordination services.   Assessments/Interventions:  Review of past medical history, allergies, medications, health status, including review of consultants reports, laboratory and other test data, was performed as part of comprehensive evaluation and provision of chronic care management services.  SDOH (Social Determinants of Health) assessments and interventions performed: SDOH Interventions    Flowsheet Row Patient Outreach Telephone from 07/26/2022 in [place] POPULATION HEALTH DEPARTMENT Telephone from 06/08/2022 in [facility] Patient Outreach Telephone from 06/02/2022 in [place] POPULATION HEALTH DEPARTMENT Patient Outreach Telephone from 05/25/2022 in [place] POPULATION HEALTH DEPARTMENT Patient Outreach Telephone from 11/23/2021 in [facility] Community Care Coordination Telephone from 11/11/2021 in [facility]  SDOH Interventions        Food Insecurity Interventions -- -- -- -- Other (Comment)  [In the process of reapplying for Food Stamps] --  Housing Interventions Other (Comment)  [Referral to BSW for housing resources] -- -- -- Intervention Not Indicated Intervention Not Indicated  Transportation Interventions -- Contracted Vendor -- -- Other (Comment)  [Currently  connected to [employer]] --  Utilities Interventions Other (Comment)  [BSW referral for assistance with utilities] -- -- -- -- --  Depression Interventions/Treatment  -- -- Counseling --  [referral to LCSW] -- --  Financial Strain Interventions -- -- -- -- Intervention Not Indicated --  Physical Activity Interventions -- -- -- -- Other (Comments)  [Patient states she is fearful of going outside and prevents her from walking/exercising outside.] --  Stress Interventions -- -- -- -- Intervention Not Indicated --  Social Connections Interventions -- -- -- -- Intervention Not Indicated --       Care Plan  Allergies  Allergen Reactions   Celery Oil Anaphylaxis   Shellfish Allergy Anaphylaxis   Entresto [Sacubitril-Valsartan] Other (See Comments)    GI upset   Hydrocodone Other (See Comments)    Reaction happened in the spine   Latex Rash and Other (See Comments)    EKG leads cause rashes- cannot tolerate for any extended length of time    Medications Reviewed Today     Reviewed by Heidi Dach, RN (Registered Nurse) on 07/26/22 at 1104  Med List Status: <None>   Medication Order Taking? Sig Documenting Provider Last Dose Status Informant  acetaminophen (TYLENOL) 500 MG tablet [id number] Yes Take 500-1,000 mg by mouth 4 (four) times daily as needed (for pain). [provider] Taking Active Self  carvedilol (COREG) 6.25 MG tablet [id number] Yes Take 1 tablet (6.25 mg total) by mouth 2 (two) times daily with a meal. [name], [name], MD Taking Active   digoxin (LANOXIN) 0.125 MG tablet [id number] Yes Take 1 tablet (0.125 mg total) by mouth daily. [name], MD Taking Active   lisinopril (ZESTRIL) 20 MG tablet [id number] Yes Take 1 tablet (20 mg total) by mouth daily. Newlin,  [name], MD Taking Active   methimazole (TAPAZOLE) 10 MG tablet [id number] Yes TAKE ONE TABLET BY MOUTH THREE TIMES A DAY. [name], MD Taking Active            Med Note ([name],  [name] A   Tue Jul 26, 2022 11:00 AM) Taking 1.5 tablet once daily as instructed by Endocrinology  Misc. Devices [place] [id number] Yes Blood pressure monitor.  Diagnosis hypertension [name], MD Taking Active            Med Note (Trinka Keshishyan A   Tue Jul 26, 2022 11:00 AM)    omeprazole (PRILOSEC) 20 MG capsule [id number] No Take 20 mg by mouth as needed.  Patient not taking: Reported on 07/26/2022   [provider] Not Taking Active   potassium chloride SA (KLOR-CON M) 20 MEQ tablet [id number] Yes Take 1 tablet (20 mEq total) by mouth daily. Minus Breeding, MD Taking Active Self           Med Note Stanford Scotland   Fri Jan 07, 2022  1:57 PM)    spironolactone (ALDACTONE) 25 MG tablet [id number] Yes Take 1 tablet (25 mg total) by mouth daily. OFFICE VISIT NEEDED FOR ADDITIONAL REFILLS [name], MD Taking Active   torsemide (DEMADEX) 20 MG tablet [id number] Yes Take 2 tablets (40 mg total) by mouth daily. 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   SOB (shortness of breath) 11/03/2017   Severe mitral regurgitation 07/20/2009   CHEST PAIN 07/20/2009   Essential hypertension 06/16/2009   AGORAPHOBIA 04/22/2009   MIGRAINE HEADACHE 04/22/2009   PYELONEPHRITIS 04/22/2009   SYNCOPE 04/22/2009   DIZZINESS 04/22/2009   PALPITATIONS 04/22/2009   ELECTROCARDIOGRAM, ABNORMAL 04/22/2009    Conditions to be addressed/monitored per PCP order:  CHF and Thyroid disorder  Care Plan : RN Care  Manager Plan of Care  Updates made by [name], RN since 07/26/2022 12:00 AM     Problem: Chronic Disease Management and Care Coordination Needs for CHF, HTN, Hyperthyroidism   Priority: High  Onset Date: 11/23/2021     Long-Range Goal: Development of Plan of Care for Chronic Disease Management and Care Coordination Needs (CHF, HTN, Hyperthyroidism)   Start Date: 11/23/2021  Expected End Date: 07/27/2022  Priority: High  Note:    Current Barriers:  Knowledge Deficits related to plan of care for management of CHF, HTN, and Hyperthyroidism  Chronic Disease Management support and education needs related to CHF, HTN, and Hyperthyroidism Lacks caregiver support.        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  RNCM Clinical Goal(s):  Patient will verbalize understanding of plan for management of CHF, HTN, and Hyperthyroidism as evidenced by improved management of these chronic diseases verbalize basic understanding of CHF, HTN, and Hyperthyroidism disease process and self health management plan as evidenced by improved management of these chronic diseases take all medications exactly as prescribed and will call provider for medication related questions as evidenced by being compliant  with all prescribed medications    attend all scheduled medical appointments: 08/18/22 with HF Clinic and Echocardiology as evidenced by attending all scheduled appointments        demonstrate improved adherence to prescribed treatment plan for CHF, HTN, and Hyperthyroidism as evidenced by improved management of these diseases. continue to work with Consulting civil engineer and/or Social Worker to address care management and care coordination needs related to CHF, HTN, and Hyperthyroidism as evidenced by adherence to CM Team Scheduled appointments     through collaboration with Consulting civil engineer, provider, and care team.   Interventions: Inter-disciplinary care team collaboration (see  longitudinal plan of care) Evaluation of current treatment plan related to  self management and patient's adherence to plan as established by provider Reviewed LCSW documentation, patient denies needs and declines rescheduling at this time BSW referral for assistance with housing and utility resources  Heart Failure Interventions:  (Status: Goal on Track (progressing): YES.)  Long Term Goal  Wt Readings from Last 3 Encounters:  06/09/22 224 lb (101.6 kg)  05/10/22 234 lb 6.4 oz (106.3 kg)  01/07/22 221 lb (100.2 kg)  Reviewed Heart Failure Action Plan in depth and provided written copy Reviewed role of diuretics in prevention of fluid overload and management of heart failure Discussed the importance of keeping all appointments with provider Provided patient with education about the role of exercise in the management of heart failure Assessed social determinant of health barriers Reviewed providers notes Advised patient to call today to arrange transportation to appointment on 08/18/22  Hyperthyroidism  (Status: Goal on Track (progressing): YES.) Long Term Goal  Evaluation of current treatment plan related to  Hyperthyroidism , Financial constraints related to increased rent and having to reapply for Food Stamps (done)  self-management and patient's adherence to plan as established by provider. Discussed plans with patient for ongoing care management follow up and provided patient with direct contact information for care management team Provided education to patient re: hyperthyroidism; Reviewed medications with patient and discussed importance of medication compliance; Reviewed scheduled/upcoming provider appointments including : See appointments in [place] ; Reviewed provider documentation from Endocrinology and discussed with patient, follow up in May 2024   Hypertension: (Status: Goal on Track (progressing): YES.) Long Term Goal Checking BP at home, reports good readings,  unable to recall last reading Last practice recorded BP readings:  BP Readings from Last 3 Encounters:  06/09/22 130/82  05/10/22 (!) 148/96  01/07/22 110/70  Most recent eGFR/CrCl:  Lab Results  Component Value Date   EGFR 103 12/09/2021    No components found for: "CRCL"  Reviewed medications with patient and discussed importance of compliance;  Counseled on the importance of exercise goals with target of 150 minutes per week Discussed plans with patient for ongoing care management follow up and provided patient with direct contact information for care management team; Reviewed scheduled/upcoming provider appointments including: 08/18/22 for Echocardiogram and HF Clinic Encouraged patient to contact [employer] member services for wellness benefits(WW vouchers, online exercise and nutrition coaching, free fresh fruits and vegetables)-revisited   Patient Goals/Self-Care Activities: Take medications as prescribed   Call pharmacy for medication refills 3-7 days in advance of running out of medications Perform all self care activities independently  Perform IADL's (shopping, preparing meals, housekeeping, managing finances) independently Call provider office for new concerns or questions  call office if I gain more than 2 pounds in one day or 5 pounds in one week watch for swelling in feet,  ankles and legs every day weigh myself daily keep all doctor appointments take medications for blood pressure exactly as prescribed       Follow Up:  Patient agrees to Care Plan and Follow-up.  Plan: The Managed Medicaid care management team will reach out to the patient again over the next 30 days.  Date/time of next scheduled RN care management/care coordination outreach:  08/26/22 @ 10:30am  [name] RN, BSN [place] RN Care Coordinator

## 2022-07-27 ENCOUNTER — Other Ambulatory Visit: Payer: Medicaid Other

## 2022-07-27 NOTE — Patient Instructions (Signed)
Visit Information  Ms. Zemanek was given information about Medicaid Managed Care team care coordination services as a part of their [facility] Medicaid benefit. [name] verbally consented to engagement with the [facility] Managed Care team.   If you are experiencing a medical emergency, please call 911 or report to your local emergency department or urgent care.   If you have a non-emergency medical problem during routine business hours, please contact your provider's office and ask to speak with a nurse.   For questions related to your Healthy [facility] health plan, please call: [phone] or visit the homepage here: [email or web address]  If you would like to schedule transportation through your Healthy [facility] plan, please call the following number at least 2 days in advance of your appointment: [phone]  For information about your ride after you set it up, call Ride Assist at [phone]. Use this number to activate a Will Call pickup, or if your transportation is late for a scheduled pickup. Use this number, too, if you need to make a change or cancel a previously scheduled reservation.  If you need transportation services right away, call [phone]. The after-hours call center is staffed 24 hours to handle ride assistance and urgent reservation requests (including discharges) 365 days a year. Urgent trips include sick visits, hospital discharge requests and life-sustaining treatment.  Call the [place] at [phone], at any time, 24 hours a day, 7 days a week. If you are in danger or need immediate medical attention call 911.  If you would like help to quit smoking, call 1-800-QUIT-NOW (434)039-3120) OR Espaol: 1-855-Djelo-Ya (5-361-443-1540) o para ms informacin haga clic aqu or Text READY to 200-400 to register via text  Ms. Rubey - following are the goals we discussed in your visit today:    Goals Addressed   None       Social Worker will follow up on 08/25/22.   [name], BSW, [place]  High Risk Managed Medicaid Team  [phone]   Following is a copy of your plan of care:  Care Plan : [place] of Care  Updates made by Ethelda Chick since 07/27/2022 12:00 AM     Problem: Chronic Disease Management and Care Coordination Needs for CHF, HTN, Hyperthyroidism   Priority: High  Onset Date: 11/23/2021     Long-Range Goal: Development of Plan of Care for Chronic Disease Management and Care Coordination Needs (CHF, HTN, Hyperthyroidism)   Start Date: 11/23/2021  Expected End Date: 07/27/2022  Priority: High  Note:    Current Barriers:  Knowledge Deficits related to plan of care for management of CHF, HTN, and Hyperthyroidism  Chronic Disease Management support and education needs related to CHF, HTN, and Hyperthyroidism Lacks caregiver support.        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  RNCM Clinical Goal(s):  Patient will verbalize understanding of plan for management of CHF, HTN, and Hyperthyroidism as evidenced by improved management of these chronic diseases verbalize basic understanding of CHF, HTN, and Hyperthyroidism disease process and self health management plan as evidenced by improved management of these chronic diseases take all medications exactly as prescribed and will call provider for medication related questions as evidenced by being compliant with all prescribed medications    attend all scheduled medical appointments: 08/18/22 with HF Clinic and Echocardiology as evidenced by attending all scheduled appointments        demonstrate improved adherence  to prescribed treatment plan for CHF, HTN, and Hyperthyroidism as evidenced by improved management of these diseases. continue to work with Consulting civil engineer and/or Social Worker to address care management and care  coordination needs related to CHF, HTN, and Hyperthyroidism as evidenced by adherence to CM Team Scheduled appointments     through collaboration with RN Care manager, provider, and care team.   Interventions: Inter-disciplinary care team collaboration (see longitudinal plan of care) Evaluation of current treatment plan related to  self management and patient's adherence to plan as established by provider Reviewed LCSW documentation, patient denies needs and declines rescheduling at this time BSW referral for assistance with housing and utility resources BSW completed a telephone outreach with patient, she stated she is currently payng 700 for rent but it will be going up to 750. Patient receives 940 in disability each month and receives foodstamps. BSW will send patient a list of resources to assist with housing and utilities. No other resources are needed at this time.  Heart Failure Interventions:  (Status: Goal on Track (progressing): YES.)  Long Term Goal  Wt Readings from Last 3 Encounters:  06/09/22 224 lb (101.6 kg)  05/10/22 234 lb 6.4 oz (106.3 kg)  01/07/22 221 lb (100.2 kg)  Reviewed Heart Failure Action Plan in depth and provided written copy Reviewed role of diuretics in prevention of fluid overload and management of heart failure Discussed the importance of keeping all appointments with provider Provided patient with education about the role of exercise in the management of heart failure Assessed social determinant of health barriers Reviewed providers notes Advised patient to call today to arrange transportation to appointment on 08/18/22  Hyperthyroidism  (Status: Goal on Track (progressing): YES.) Long Term Goal  Evaluation of current treatment plan related to  Hyperthyroidism , Financial constraints related to increased rent and having to reapply for Food Stamps (done)  self-management and patient's adherence to plan as established by provider. Discussed plans with patient  for ongoing care management follow up and provided patient with direct contact information for care management team Provided education to patient re: hyperthyroidism; Reviewed medications with patient and discussed importance of medication compliance; Reviewed scheduled/upcoming provider appointments including : See appointments in [place] ; Reviewed provider documentation from Endocrinology and discussed with patient, follow up in May 2024   Hypertension: (Status: Goal on Track (progressing): YES.) Long Term Goal Checking BP at home, reports good readings, unable to recall last reading Last practice recorded BP readings:  BP Readings from Last 3 Encounters:  06/09/22 130/82  05/10/22 (!) 148/96  01/07/22 110/70  Most recent eGFR/CrCl:  Lab Results  Component Value Date   EGFR 103 12/09/2021    No components found for: "CRCL"  Reviewed medications with patient and discussed importance of compliance;  Counseled on the importance of exercise goals with target of 150 minutes per week Discussed plans with patient for ongoing care management follow up and provided patient with direct contact information for care management team; Reviewed scheduled/upcoming provider appointments including: 08/18/22 for Echocardiogram and HF Clinic Encouraged patient to contact [employer] member services for wellness benefits(WW vouchers, online exercise and nutrition coaching, free fresh fruits and vegetables)-revisited   Patient Goals/Self-Care Activities: Take medications as prescribed   Call pharmacy for medication refills 3-7 days in advance of running out of medications Perform all self care activities independently  Perform IADL's (shopping, preparing meals, housekeeping, managing finances) independently Call provider office for new concerns or questions  call office  if I gain more than 2 pounds in one day or 5 pounds in one week watch for swelling in feet, ankles and legs every  day weigh myself daily keep all doctor appointments take medications for blood pressure exactly as prescribed

## 2022-07-27 NOTE — Patient Outreach (Signed)
Medicaid Managed Care Social Work Note  07/27/2022 Name:  Sarah Reilly MRN:  [id number] DOB:  [birth year]  Sarah Reilly is an 42 y.o. year old female who is a primary patient of [name], MD.  The [place] team was consulted for assistance with:  [employer]   Sarah Reilly was given information about Medicaid Managed Care Coordination team services today. [name] agreed to services and verbal consent obtained.  Engaged with patient  for by telephone forinitial visit in response to referral for case management and/or care coordination services.   Assessments/Interventions:  Review of past medical history, allergies, medications, health status, including review of consultants reports, laboratory and other test data, was performed as part of comprehensive evaluation and provision of chronic care management services.  SDOH: (Social Determinant of Health) assessments and interventions performed: SDOH Interventions    Flowsheet Row Patient Outreach Telephone from 07/26/2022 in [place] Telephone from 06/08/2022 in [place] and [place] Patient Outreach Telephone from 06/02/2022 in [place] Patient Outreach Telephone from 05/25/2022 in [place] Patient Outreach Telephone from 11/23/2021 in [place] Telephone from 11/11/2021 in [place] and [place]  SDOH Interventions        Food Insecurity Interventions -- -- -- -- Other (Comment)  [In the process of reapplying for Food Stamps] --  Housing Interventions Other (Comment)  [Referral to BSW for housing resources] -- -- -- Intervention Not Indicated Intervention Not Indicated  Transportation Interventions -- Contracted Vendor -- -- Other (Comment)  [Currently connected to [employer]  Insurance] --  Utilities Interventions Other (Comment)  [BSW referral for assistance with utilities] -- -- -- -- --  Depression Interventions/Treatment  -- -- Counseling --  [referral to LCSW] -- --  Financial Strain Interventions -- -- -- -- Intervention Not Indicated --  Physical Activity Interventions -- -- -- -- Other (Comments)  [Patient states she is fearful of going outside and prevents her from walking/exercising outside.] --  Stress Interventions -- -- -- -- Intervention Not Indicated --  Social Connections Interventions -- -- -- -- Intervention Not Indicated --     BSW completed a telephone outreach with patient, she stated she is currently payng 700 for rent but it will be going up to 750. Patient receives 940 in disability each month and receives foodstamps. BSW will send patient a list of resources to assist with housing and utilities. No other resources are needed at this time.  Advanced Directives Status:  Not addressed in this encounter.  Care Plan                 Allergies  Allergen Reactions   Celery Oil Anaphylaxis   Shellfish Allergy Anaphylaxis   Entresto [Sacubitril-Valsartan] Other (See Comments)    GI upset   Hydrocodone Other (See Comments)    Reaction happened in the spine   Latex Rash and Other (See Comments)    EKG leads cause rashes- cannot tolerate for any extended length of time    Medications Reviewed Today     Reviewed by [name], RN (Registered Nurse) on 07/26/22 at 1104  Med List Status: <None>   Medication Order Taking? Sig Documenting Provider Last Dose Status Informant  acetaminophen (TYLENOL) 500 MG tablet [id number] Yes Take 500-1,000 mg by mouth 4 (four) times daily as needed (for pain). [provider]  Taking Active Self  carvedilol (COREG) 6.25 MG tablet [id number] Yes Take 1 tablet (6.25 mg total) by mouth 2 (two) times daily with a meal. [name], [name], MD Taking Active   digoxin (LANOXIN) 0.125 MG tablet [id number] Yes  Take 1 tablet (0.125 mg total) by mouth daily. [name], MD Taking Active   lisinopril (ZESTRIL) 20 MG tablet [id number] Yes Take 1 tablet (20 mg total) by mouth daily. [name], MD Taking Active   methimazole (TAPAZOLE) 10 MG tablet [id number] Yes TAKE ONE TABLET BY MOUTH THREE TIMES A DAY. [name], MD Taking Active            Med Note (ROBB, MELANIE A   Tue Jul 26, 2022 11:00 AM) Taking 1.5 tablet once daily as instructed by Endocrinology  Misc. Devices [place] [id number] Yes Blood pressure monitor.  Diagnosis hypertension [name], MD Taking Active            Med Note (ROBB, MELANIE A   Tue Jul 26, 2022 11:00 AM)    omeprazole (PRILOSEC) 20 MG capsule [id number] No Take 20 mg by mouth as needed.  Patient not taking: Reported on 07/26/2022   [provider] Not Taking Active   potassium chloride SA (KLOR-CON M) 20 MEQ tablet [id number] Yes Take 1 tablet (20 mEq total) by mouth daily. Minus Breeding, MD Taking Active Self           Med Note [facility] [place]   Fri Jan 07, 2022  1:57 PM)    spironolactone (ALDACTONE) 25 MG tablet [id number] Yes Take 1 tablet (25 mg total) by mouth daily. OFFICE VISIT NEEDED FOR ADDITIONAL REFILLS [name], MD Taking Active   torsemide (DEMADEX) 20 MG tablet [id number] Yes Take 2 tablets (40 mg total) by mouth daily. 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   SOB (shortness of breath) 11/03/2017   Severe mitral regurgitation 07/20/2009    CHEST PAIN 07/20/2009   Essential hypertension 06/16/2009   AGORAPHOBIA 04/22/2009   MIGRAINE HEADACHE 04/22/2009   PYELONEPHRITIS 04/22/2009   SYNCOPE 04/22/2009   DIZZINESS 04/22/2009   PALPITATIONS 04/22/2009   ELECTROCARDIOGRAM, ABNORMAL 04/22/2009    Conditions to be addressed/monitored per PCP order:   community resources  Care Plan : RN Care Manager Plan of Care  Updates made by Ethelda Chick since 07/27/2022 12:00 AM     Problem: Chronic Disease Management and Care Coordination Needs for CHF, HTN, Hyperthyroidism   Priority: High  Onset Date: 11/23/2021     Long-Range Goal: Development of Plan of Care for Chronic Disease Management and Care Coordination Needs (CHF, HTN, Hyperthyroidism)   Start Date: 11/23/2021  Expected End Date: 07/27/2022  Priority: High  Note:    Current Barriers:  Knowledge Deficits related to plan of care for management of CHF, HTN, and Hyperthyroidism  Chronic Disease Management support and education needs related to CHF, HTN, and Hyperthyroidism Lacks caregiver support.        Film/video editor.  Non-adherence to prescribed  medication regimen  RNCM Clinical Goal(s):  Patient will verbalize understanding of plan for management of CHF, HTN, and Hyperthyroidism as evidenced by improved management of these chronic diseases verbalize basic understanding of CHF, HTN, and Hyperthyroidism disease process and self health management plan as evidenced by improved management of these chronic diseases take all medications exactly as prescribed and will call provider for medication related questions as evidenced by being compliant with all prescribed medications    attend all scheduled medical appointments: 08/18/22 with HF Clinic and Echocardiology as evidenced by attending all scheduled appointments        demonstrate improved adherence to prescribed treatment plan for CHF, HTN, and Hyperthyroidism as evidenced by improved management of these  diseases. continue to work with Consulting civil engineer and/or Social Worker to address care management and care coordination needs related to CHF, HTN, and Hyperthyroidism as evidenced by adherence to CM Team Scheduled appointments     through collaboration with RN Care manager, provider, and care team.   Interventions: Inter-disciplinary care team collaboration (see longitudinal plan of care) Evaluation of current treatment plan related to  self management and patient's adherence to plan as established by provider Reviewed LCSW documentation, patient denies needs and declines rescheduling at this time BSW referral for assistance with housing and utility resources BSW completed a telephone outreach with patient, she stated she is currently payng 700 for rent but it will be going up to 750. Patient receives 940 in disability each month and receives foodstamps. BSW will send patient a list of resources to assist with housing and utilities. No other resources are needed at this time.  Heart Failure Interventions:  (Status: Goal on Track (progressing): YES.)  Long Term Goal  Wt Readings from Last 3 Encounters:  06/09/22 224 lb (101.6 kg)  05/10/22 234 lb 6.4 oz (106.3 kg)  01/07/22 221 lb (100.2 kg)  Reviewed Heart Failure Action Plan in depth and provided written copy Reviewed role of diuretics in prevention of fluid overload and management of heart failure Discussed the importance of keeping all appointments with provider Provided patient with education about the role of exercise in the management of heart failure Assessed social determinant of health barriers Reviewed providers notes Advised patient to call today to arrange transportation to appointment on 08/18/22  Hyperthyroidism  (Status: Goal on Track (progressing): YES.) Long Term Goal  Evaluation of current treatment plan related to  Hyperthyroidism , Financial constraints related to increased rent and having to reapply for Food Stamps (done)   self-management and patient's adherence to plan as established by provider. Discussed plans with patient for ongoing care management follow up and provided patient with direct contact information for care management team Provided education to patient re: hyperthyroidism; Reviewed medications with patient and discussed importance of medication compliance; Reviewed scheduled/upcoming provider appointments including : See appointments in [place] ; Reviewed provider documentation from Endocrinology and discussed with patient, follow up in May 2024   Hypertension: (Status: Goal on Track (progressing): YES.) Long Term Goal Checking BP at home, reports good readings, unable to recall last reading Last practice recorded BP readings:  BP Readings from Last 3 Encounters:  06/09/22 130/82  05/10/22 (!) 148/96  01/07/22 110/70  Most recent eGFR/CrCl:  Lab Results  Component Value Date   EGFR 103 12/09/2021    No components found for: "CRCL"  Reviewed medications with patient and discussed importance of compliance;  Counseled on the importance of exercise goals with target of 150 minutes per  week Discussed plans with patient for ongoing care management follow up and provided patient with direct contact information for care management team; Reviewed scheduled/upcoming provider appointments including: 08/18/22 for Echocardiogram and HF Clinic Encouraged patient to contact [employer] member services for wellness benefits(WW vouchers, online exercise and nutrition coaching, free fresh fruits and vegetables)-revisited   Patient Goals/Self-Care Activities: Take medications as prescribed   Call pharmacy for medication refills 3-7 days in advance of running out of medications Perform all self care activities independently  Perform IADL's (shopping, preparing meals, housekeeping, managing finances) independently Call provider office for new concerns or questions  call office if I gain more  than 2 pounds in one day or 5 pounds in one week watch for swelling in feet, ankles and legs every day weigh myself daily keep all doctor appointments take medications for blood pressure exactly as prescribed       Follow up:  Patient agrees to Care Plan and Follow-up.  Plan: The Managed Medicaid care management team will reach out to the patient again over the next 30 days.  Date/time of next scheduled Social Work care management/care coordination outreach:  08/25/22  Gus Puma, Kenard Gower, [facility]  [facility]  High Risk Managed Medicaid Team  [phone]

## 2022-08-18 ENCOUNTER — Ambulatory Visit (HOSPITAL_BASED_OUTPATIENT_CLINIC_OR_DEPARTMENT_OTHER)
Admission: RE | Admit: 2022-08-18 | Discharge: 2022-08-18 | Disposition: A | Discharge status: Home / Self Care | Payer: Medicaid Other | Source: Ambulatory Visit | Attending: Internal Medicine | Admitting: Internal Medicine

## 2022-08-18 ENCOUNTER — Ambulatory Visit (HOSPITAL_COMMUNITY)
Admission: RE | Admit: 2022-08-18 | Discharge: 2022-08-18 | Disposition: A | Discharge status: Home / Self Care | Payer: Medicaid Other | Source: Ambulatory Visit | Attending: Internal Medicine | Admitting: Internal Medicine

## 2022-08-18 ENCOUNTER — Encounter (HOSPITAL_COMMUNITY): Payer: Self-pay | Admitting: Internal Medicine

## 2022-08-18 VITALS — BP 108/78 | HR 91 | Wt 230.6 lb

## 2022-08-18 DIAGNOSIS — I110 Hypertensive heart disease with heart failure: Secondary | ICD-10-CM | POA: Insufficient documentation

## 2022-08-18 DIAGNOSIS — E0590 Thyrotoxicosis, unspecified without thyrotoxic crisis or storm: Secondary | ICD-10-CM | POA: Insufficient documentation

## 2022-08-18 DIAGNOSIS — I5022 Chronic systolic (congestive) heart failure: Secondary | ICD-10-CM

## 2022-08-18 DIAGNOSIS — I429 Cardiomyopathy, unspecified: Secondary | ICD-10-CM | POA: Diagnosis not present

## 2022-08-18 DIAGNOSIS — Z6836 Body mass index (BMI) 36.0-36.9, adult: Secondary | ICD-10-CM | POA: Diagnosis not present

## 2022-08-18 DIAGNOSIS — I340 Nonrheumatic mitral (valve) insufficiency: Secondary | ICD-10-CM

## 2022-08-18 DIAGNOSIS — Z79899 Other long term (current) drug therapy: Secondary | ICD-10-CM | POA: Diagnosis not present

## 2022-08-18 DIAGNOSIS — E669 Obesity, unspecified: Secondary | ICD-10-CM | POA: Diagnosis not present

## 2022-08-18 LAB — ECHOCARDIOGRAM COMPLETE
AR max vel: 2.08 cm2
AV Area VTI: 2.01 cm2
AV Area mean vel: 1.9 cm2
AV Mean grad: 3 mmHg
AV Peak grad: 5.7 mmHg
Ao pk vel: 1.2 m/s
Area-P 1/2: 4.49 cm2
Calc EF: 26.7 %
MV M vel: 4.77 m/s
MV Peak grad: 90.8 mmHg
MV VTI: 1.19 cm2
Radius: 0.6 cm
S' Lateral: 5.5 cm
Single Plane A2C EF: 25 %
Single Plane A4C EF: 29.5 %

## 2022-08-18 LAB — COMPREHENSIVE METABOLIC PANEL
ALT: 22 U/L (ref 0–44)
AST: 24 U/L (ref 15–41)
Albumin: 3.9 g/dL (ref 3.5–5.0)
Alkaline Phosphatase: 66 U/L (ref 38–126)
Anion gap: 10 (ref 5–15)
BUN: 20 mg/dL (ref 6–20)
CO2: 19 mmol/L — ABNORMAL LOW (ref 22–32)
Calcium: 9.2 mg/dL (ref 8.9–10.3)
Chloride: 106 mmol/L (ref 98–111)
Creatinine, Ser: 0.77 mg/dL (ref 0.44–1.00)
GFR, Estimated: >60 mL/min (ref >60)
Glucose, Bld: 121 mg/dL — ABNORMAL HIGH (ref 70–99)
Potassium: 3.7 mmol/L (ref 3.5–5.1)
Sodium: 135 mmol/L (ref 135–145)
Total Bilirubin: 1.1 mg/dL (ref 0.3–1.2)
Total Protein: 7.4 g/dL (ref 6.5–8.1)

## 2022-08-18 LAB — T4, FREE: Free T4: 0.86 ng/dL (ref 0.61–1.12)

## 2022-08-18 LAB — TSH: TSH: 3.997 u[IU]/mL (ref 0.350–4.500)

## 2022-08-18 LAB — DIGOXIN LEVEL: Digoxin Level: 0.6 ng/mL — ABNORMAL LOW (ref 0.8–2.0)

## 2022-08-18 LAB — BRAIN NATRIURETIC PEPTIDE: B Natriuretic Peptide: 24.1 pg/mL (ref 0.0–100.0)

## 2022-08-18 MED ORDER — CARVEDILOL 6.25 MG PO TABS: 9.3750 mg | ORAL_TABLET | Freq: Two times a day (BID) | ORAL | 3 refills | Status: DC

## 2022-08-18 NOTE — Patient Instructions (Addendum)
INCREASE Carvedilol to 9.317m ( 1 1/2 Tab) Twice daily  Labs done today, your results will be available in MyChart, we will contact you for abnormal readings.  Your physician recommends that you schedule a follow-up appointment in: 4 months  If you have any questions or concerns before your next appointment please send [place] message through [place] call our office at [phone]    TO LEAVE A MESSAGE FOR THE NURSE SELECT OPTION 2, PLEASE LEAVE A MESSAGE INCLUDING: YOUR NAME DATE OF BIRTH CALL BACK NUMBER REASON FOR CALL**this is important as we prioritize the call backs  YOU WILL RECEIVE A CALL BACK THE SAME DAY AS LONG AS YOU CALL BEFORE 4:00 PM  At [facility] you and your health needs are our priority. As part of our continuing mission to provide you with exceptional heart care, we have created designated Provider Care Teams. These Care Teams include your primary Cardiologist (physician) and Advanced Practice Providers (APPs- Physician Assistants and Nurse Practitioners) who all work together to provide you with the care you need, when you need it.   You may see any of the following providers on your designated Care Team at your next follow up: Dr DGlori BickersDr DLoralie ChampagneDr. ARoxana Hires NP BLyda Jester PU[facility]JE[facility] PUtahAForestine Na NP LAudry Riles PharmD   Please be sure to bring in all your medications bottles to every appointment.    [facility]

## 2022-08-18 NOTE — Progress Notes (Signed)
ReDS Vest / Clip - 08/18/22 1100       ReDS Vest / Clip   Station Marker D    Ruler Value 31.5    ReDS Value Range Low volume    ReDS Actual Value 28

## 2022-08-18 NOTE — Progress Notes (Signed)
Advanced Heart Failure Clinic Note  PCP: [name], MD Endocrinology: Dr Dwyane Dee Primary Cardiologist: Dr Percival Spanish  HF Cardiologist: Dr Haroldine Laws   HPI: Sarah Reilly is a 42 y.o. 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    She developed HF after the birth of her 29th child in 2010. EF 20%. Did not have a heart cath.  She then moved back to [place] to be by her mother. Apparently EF dropped to 15-20% for a period. They suggested ICD but she refused. She returned to [place].  In July 2019 she was diagnosed with hyperthyroidism. Started on methimazole by Dr Dwyane Dee.   Previously she was having CP in setting of 30 pound weight gain. Dr. Haroldine Laws suggested R/L cath but she refused saying she wanted to try and lose weight first and see how she felt. We eventually rescheduled but she couldn't get COVID test.   She was seen in [facility] 8/21 and was quite tachycardic. She had not seen Dr. Dwyane Dee since 12/20. Ran out of methimazole refils. 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  TSH < 0.010. She has not followed up with Endo. On the chart there is a dismissal letter from Dr. Dwyane Dee dated 02/27/20.  Echo 04/02/20 EF 35-40%, RV normal. Mod-severe MR w/ tethering of posterior leaflet.   Echo 9/22 EF 30-35% RV mildly. Bi atrial enlargement.    Saw Dr Percival Spanish 11/05/21 with increased shortness of breath, orthopnea, and PND. Admitted with A/C HFrEF. TSH 0.005. Echo EF 25-30%. Diuresed with IV lasix. cMRI showed LVEF 25%, RV reduced, no LGE. No clear etiology. GDMT titrated. No Entresto d/t intolerance, started on ACE. Discharged home, weight 210 lbs.  Today she returns for HF follow up. Feels better. Doing ADLs without too much problem. Takes torsemide 1-2x/week if she feels congested. Weight stable but not weighing  often. Compliant with meds.  Echo today 08/18/22: EF 20-25% RV mildly down mod-severe MR Personally reviewed   Cardiac Studies: - cMRI (5/23): LVEF 25%, RVEF 30%, no LGE, moderate MR and TR.  - Echo (5/23): EF 25-30%, severe LV dysfunction with global HK, RV mildly down, mild to moderate MR, moderate TR  - Echo (03/09/21): EF 30-35%, RV mildly reduced, severe MR, mild-mod TR   - Echo (04/02/20): EF 35-40%   - Echo (2/21): LV dilated/spherical 30-35% likely severe MR. RV ok   - Echo (12/2018): EF 35-40% Mod-Severe MR, RV normal  - Echo (11/16/2017): EF 20-25% Severe MR   ROS: All systems negative except as listed in HPI, PMH and Problem List.  SH:  Social History   Socioeconomic History   Marital status: Single    Spouse name: Not on file   Number of children: Not on file   Years of education: Not on file   Highest education level: Not on file  Occupational History   Not on file  Tobacco Use   Smoking status: Former   Smokeless tobacco: Never  Substance and Sexual Activity   Alcohol use: Yes    Alcohol/week: 4.0 standard drinks of alcohol    Types: 3 Glasses of wine, 1 Standard drinks or equivalent per week    Comment: Occassional   Drug use: Never   Sexual activity: Not on file  Other Topics Concern   Not on file  Social History Narrative   She  has 4 children ages 76 and under.  She stays at home. She does not smoke cigarettes or drink alcohol. Does not get regular exercise.    Social Determinants of Health   Financial Resource Strain: High Risk (11/23/2021)   Overall Financial Resource Strain (CARDIA)    Difficulty of Paying Living Expenses: Hard  Food Insecurity: Food Insecurity Present (11/23/2021)   Hunger Vital Sign    Worried About Running Out of Food in the Last Year: Sometimes true    Ran Out of Food in the Last Year: Sometimes true  Transportation Needs: Unmet Transportation Needs (06/08/2022)   PRAPARE - Hydrologist (Medical):  Yes    Lack of Transportation (Non-Medical): Yes  Physical Activity: Inactive (11/23/2021)   Exercise Vital Sign    Days of Exercise per Week: 0 days    Minutes of Exercise per Session: 0 min  Stress: Stress Concern Present (11/23/2021)   [place]    Feeling of Stress : To some extent  Social Connections: Unknown (11/23/2021)   Social Connection and Isolation Panel [NHANES]    Frequency of Communication with Friends and Family: More than three times a week    Frequency of Social Gatherings with Friends and Family: More than three times a week    Attends Religious Services: Never    Marine scientist or Organizations: No    Attends Music therapist: Never    Marital Status: Not on file  Intimate Partner Violence: Not on file   FH:  Family History  Problem Relation Age of Onset   Heart disease Mother        No details   Hypertension Mother    Diabetes Mother    Hypertension Father    Heart murmur Sister    Past Medical History:  Diagnosis Date   Cardiomyopathy    CHF (congestive heart failure) (HCC)    HTN (hypertension)    Mitral regurgitation    Current Outpatient Medications  Medication Sig Dispense Refill   acetaminophen (TYLENOL) 500 MG tablet Take 500-1,000 mg by mouth 4 (four) times daily as needed (for pain).     carvedilol (COREG) 6.25 MG tablet Take 1 tablet (6.25 mg total) by mouth 2 (two) times daily with a meal. 60 tablet 6   digoxin (LANOXIN) 0.125 MG tablet Take 1 tablet (0.125 mg total) by mouth daily. 30 tablet 6   lisinopril (ZESTRIL) 20 MG tablet Take 1 tablet (20 mg total) by mouth daily. 30 tablet 3   methimazole (TAPAZOLE) 10 MG tablet Take 15 mg by mouth daily.     Misc. Devices MISC Blood pressure monitor.  Diagnosis hypertension 1 each 0   potassium chloride SA (KLOR-CON M) 20 MEQ tablet Take 1 tablet (20 mEq total) by mouth daily. 90 tablet 3   spironolactone  (ALDACTONE) 25 MG tablet Take 1 tablet (25 mg total) by mouth daily. OFFICE VISIT NEEDED FOR ADDITIONAL REFILLS 30 tablet 0   torsemide (DEMADEX) 20 MG tablet Take 2 tablets (40 mg total) by mouth daily. 30 tablet 7   No current facility-administered medications for this encounter.   BP 108/78   Pulse 91   Wt 104.6 kg (230 lb 9.6 oz)   SpO2 98%   BMI 36.12 kg/m   Wt Readings from Last 3 Encounters:  08/18/22 104.6 kg (230 lb 9.6 oz)  06/09/22 101.6 kg (224 lb)  05/10/22 106.3 kg (234 lb  6.4 oz)   PHYSICAL EXAM: General:  Well appearing. No resp difficulty HEENT: normal Neck: supple. no JVD. Carotids 2+ bilat; no bruits. No lymphadenopathy. + thyromegaly Cor: PMI nondisplaced. Regular rate & rhythm. No rubs, gallops or murmurs. Lungs: clear Abdomen: obese soft, nontender, nondistended. No hepatosplenomegaly. No bruits or masses. Good bowel sounds. Extremities: no cyanosis, clubbing, rash, edema Neuro: alert & orientedx3, cranial nerves grossly intact. moves all 4 extremities w/o difficulty. Affect pleasant   ReDs: 28%  ASSESSMENT & PLAN:  Chronic Systolic Heart Failure - Onset in 2010. 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 - Echo (5/19): EF 20-25% with severe posterior MR.   - Echo (7/20): EF 35-40% mod-severe MR with restricted posterior leaflet  - Echo (2/21): EF 30-35% mod-severe MR RV ok  - Echo (10/21): EF 35-40% moderate to severe MR  - Echo (9/22): EF 30-35%, RV mildly reduced, Severe MR, Mild TR  - Echo (5/23): EF 25-30%, severe LV dilation, mildly decreased RV function, severe LAE, moderate-severe MR with thickened mitral valve, moderate TR, IVC dilated.  - cMRI (5/23): LVEF 25% RVEF 30%, No LGE. - Echo today 08/18/22: EF 20-25% RV mildly down mod-severe MR Personally reviewed - Improved NYHA II-III, Volume ok ReDS 26% - Using torsemide 40 mg PRN.  - Continue lisinopril 20 mg daily (no Entresto due to GI upset). - Continue  spironolactone 25 mg daily.  - Continue digoxin 0.125 mg daily. - Increase carvedilol 9.375 mg bid  - No SGLT2i d/t multiple episodes of pyelonephritis in past. Denies recent GU symptoms.  - Again discussed ICD and she adamantly refused   2. Hyperthyroidism  - Previously on methimazole and followed by Dr Dwyane Dee.  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   - She was dismissed from [facility].   - She has Endocrine follow up at [facility]. - Continue methimazole 20 mg tid.  - Off prednisone due to stomach upset  - Continue beta blocker.  - Check labs today   3. Mitral regurgitation  - Moderate-severe on echo 5/23.  With severe LV and LA dilation, suspect component of functional MR but the mitral valve is also thickened with posterior leaflet restricted  - She is not inclined to invasive management/evaluation, hopefully MR will improve with medical management.  - Echo today 08/18/22: EF 20-25% RV mildly down mod-severe MR Personally reviewed  Glori Bickers, MD  11:37 AM

## 2022-08-19 LAB — T3, FREE: T3, Free: 2.5 pg/mL (ref 2.0–4.4)

## 2022-08-25 ENCOUNTER — Other Ambulatory Visit: Payer: Medicaid Other

## 2022-08-25 NOTE — Patient Outreach (Signed)
Medicaid Managed Care Social Work Note  08/25/2022 Name:  Sarah Reilly MRN:  [id number] DOB:  05-16-1981  Sarah Reilly is an 42 y.o. year old female who is a primary patient of Sarah Rakes, MD.  The Medicaid Managed Care Coordination team was consulted for assistance with:  [employer]   Sarah Reilly was given information about Medicaid Managed Care Coordination team services today. Sarah Reilly Patient agreed to services and verbal consent obtained.  Engaged with patient  for by telephone forfollow up visit in response to referral for case management and/or care coordination services.   Assessments/Interventions:  Review of past medical history, allergies, medications, health status, including review of consultants reports, laboratory and other test data, was performed as part of comprehensive evaluation and provision of chronic care management services.  SDOH: (Social Determinant of Health) assessments and interventions performed: SDOH Interventions    Flowsheet Row Patient Outreach Telephone from 07/26/2022 in [place] Telephone from 06/08/2022 in [place] and [place] Patient Outreach Telephone from 06/02/2022 in [place] Patient Outreach Telephone from 05/25/2022 in [place] Patient Outreach Telephone from 11/23/2021 in [place] Telephone from 11/11/2021 in [place] and [place]  SDOH Interventions        Food Insecurity Interventions -- -- -- -- Other (Comment)  [In the process of reapplying for Food Stamps] --  Housing Interventions Other (Comment)  [Referral to BSW for housing resources] -- -- -- Intervention Not Indicated Intervention Not Indicated  Transportation Interventions -- Contracted Vendor -- -- Other (Comment)  [Currently connected to [employer]  Insurance] --  Utilities Interventions Other (Comment)  [BSW referral for assistance with utilities] -- -- -- -- --  Depression Interventions/Treatment  -- -- Counseling --  [referral to LCSW] -- --  Financial Strain Interventions -- -- -- -- Intervention Not Indicated --  Physical Activity Interventions -- -- -- -- Other (Comments)  [Patient states she is fearful of going outside and prevents her from walking/exercising outside.] --  Stress Interventions -- -- -- -- Intervention Not Indicated --  Social Connections Interventions -- -- -- -- Intervention Not Indicated --     BSW completed a telephone outreach with patient. She stated she did receive the resources BSW sent to her. Patient did have a question about the housing waitlist and BSW encouraged patient to call and see if they would open the list anytime soon. No other resources are needed at this time.  Advanced Directives Status:  Not addressed in this encounter.  Care Plan                 Allergies  Allergen Reactions   Celery Oil Anaphylaxis   Shellfish Allergy Anaphylaxis   Entresto [Sacubitril-Valsartan] Other (See Comments)    GI upset   Hydrocodone Other (See Comments)    Reaction happened in the spine   Latex Rash and Other (See Comments)    EKG leads cause rashes- cannot tolerate for any extended length of time    Medications Reviewed Today     Reviewed by [name], RN (Registered Nurse) on 07/26/22 at 1104  Med List Status: <None>   Medication Order Taking? Sig Documenting Provider Last Dose Status Informant  acetaminophen (TYLENOL) 500 MG tablet [id number] Yes Take 500-1,000 mg by mouth 4 (four) times daily as needed (for pain). [provider] Taking Active Self  carvedilol (COREG) 6.25 MG tablet [id number] Yes Take 1 tablet (6.25 mg total) by mouth 2 (two) times daily with a meal. [name], [name], MD Taking Active   digoxin (LANOXIN) 0.125 MG tablet [id number] Yes Take 1 tablet (0.125 mg total)  by mouth daily. Sarah Rakes, MD Taking Active   lisinopril (ZESTRIL) 20 MG tablet [id number] Yes Take 1 tablet (20 mg total) by mouth daily. Sarah Rakes, MD Taking Active   methimazole (TAPAZOLE) 10 MG tablet [id number] Yes TAKE ONE TABLET BY MOUTH THREE TIMES A DAY. Sarah Rakes, MD Taking Active            Med Note (ROBB, MELANIE A   Tue Jul 26, 2022 11:00 AM) Taking 1.5 tablet once daily as instructed by Endocrinology  Misc. Devices [place] [id number] Yes Blood pressure monitor.  Diagnosis hypertension Sarah Rakes, MD Taking Active            Med Note (ROBB, MELANIE A   Tue Jul 26, 2022 11:00 AM)    omeprazole (PRILOSEC) 20 MG capsule [id number] No Take 20 mg by mouth as needed.  Patient not taking: Reported on 07/26/2022   [provider] Not Taking Active   potassium chloride SA (KLOR-CON M) 20 MEQ tablet [id number] Yes Take 1 tablet (20 mEq total) by mouth daily. Minus Breeding, MD Taking Active Self           Med Note [place]   Fri Jan 07, 2022  1:57 PM)    spironolactone (ALDACTONE) 25 MG tablet [id number] Yes Take 1 tablet (25 mg total) by mouth daily. OFFICE VISIT NEEDED FOR ADDITIONAL REFILLS Sarah Rakes, MD Taking Active   torsemide (DEMADEX) 20 MG tablet [id number] Yes Take 2 tablets (40 mg total) by mouth daily. 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   Follow up:  Patient agrees to Care Plan and Follow-up.  Plan: The  Patient has been provided with contact information for the Managed Medicaid care management team and has been advised to call with any health related questions or concerns.   [name], BSW, [place] Managed Medicaid Team  [phone]

## 2022-08-25 NOTE — Patient Instructions (Signed)
Visit Information  Sarah Reilly was given information about Medicaid Managed Care team care coordination services as a part of their [facility] Medicaid benefit. [name] verbally consented to engagement with the [facility] Managed Care team.   If you are experiencing a medical emergency, please call 911 or report to your local emergency department or urgent care.   If you have a non-emergency medical problem during routine business hours, please contact your provider's office and ask to speak with a nurse.   For questions related to your Healthy [facility] health plan, please call: [phone] or visit the homepage here: [email or web address]  If you would like to schedule transportation through your Healthy [facility] plan, please call the following number at least 2 days in advance of your appointment: [phone]  For information about your ride after you set it up, call Ride Assist at [phone]. Use this number to activate a Will Call pickup, or if your transportation is late for a scheduled pickup. Use this number, too, if you need to make a change or cancel a previously scheduled reservation.  If you need transportation services right away, call [phone]. The after-hours call center is staffed 24 hours to handle ride assistance and urgent reservation requests (including discharges) 365 days a year. Urgent trips include sick visits, hospital discharge requests and life-sustaining treatment.  Call the [place] at [phone], at any time, 24 hours a day, 7 days a week. If you are in danger or need immediate medical attention call 911.  If you would like help to quit smoking, call 1-800-QUIT-NOW 873-362-9682) OR Espaol: 1-855-Djelo-Ya HD:1601594) o para ms informacin haga clic aqu or Text READY to 200-400 to register via text  [name] - following are the goals we discussed in your visit today:    Goals Addressed   None      The  Patient                                              has been provided with contact information for the Managed Medicaid care management team and has been advised to call with any health related questions or concerns.   [name], BSW, [place] Managed Medicaid Team  [phone]   Following is a copy of your plan of care:  There are no care plans that you recently modified to display for this patient.

## 2022-08-26 ENCOUNTER — Other Ambulatory Visit: Payer: Medicaid Other | Admitting: *Deleted

## 2022-08-26 ENCOUNTER — Encounter: Payer: Self-pay | Admitting: *Deleted

## 2022-08-26 NOTE — Patient Outreach (Signed)
Medicaid Managed Care   Nurse Care Manager Note  08/26/2022 Name:  Sarah Reilly MRN:  [id number] DOB:  1980/09/22  Sarah Reilly is an 42 y.o. year old female who is a primary patient of Sarah Rakes, MD.  The [facility] Managed Care Coordination team was consulted for assistance with:    CHF HTN Thyroid disorder  Ms. Sarah Reilly was given information about Medicaid Managed Care Coordination team services today. [name] agreed to services and verbal consent obtained.  Engaged with patient by telephone for follow up visit in response to provider referral for case management and/or care coordination services.   Assessments/Interventions:  Review of past medical history, allergies, medications, health status, including review of consultants reports, laboratory and other test data, was performed as part of comprehensive evaluation and provision of chronic care management services.  SDOH (Social Determinants of Health) assessments and interventions performed: SDOH Interventions    Flowsheet Row Patient Outreach Telephone from 08/26/2022 in [place] Patient Outreach Telephone from 07/26/2022 in [place] Telephone from 06/08/2022 in [place] and [place] Patient Outreach Telephone from 06/02/2022 in [place] Patient Outreach Telephone from 05/25/2022 in [place] Patient Outreach Telephone from 11/23/2021 in [place] Interventions        Food Insecurity Interventions Intervention Not Indicated -- -- -- -- Other (Comment)  [In the process of reapplying for [place] Interventions -- Other (Comment)  [Referral to [employer] for housing resources] -- -- -- Intervention Not Indicated  Transportation Interventions -- -- Contracted Vendor -- -- Other (Comment)  [Currently  connected to [employer]]  Utilities Interventions -- Other (Comment)  [BSW referral for assistance with utilities] -- -- -- --  Depression Interventions/Treatment  -- -- -- Counseling --  [referral to LCSW] --  Financial Strain Interventions -- -- -- -- -- Intervention Not Indicated  Physical Activity Interventions -- -- -- -- -- Other (Comments)  [Patient states she is fearful of going outside and prevents her from walking/exercising outside.]  Stress Interventions -- -- -- -- -- Intervention Not Indicated  Social Connections Interventions -- -- -- -- -- Intervention Not Indicated       Care Plan  Allergies  Allergen Reactions   Celery Oil Anaphylaxis   Shellfish Allergy Anaphylaxis   Entresto [Sacubitril-Valsartan] Other (See Comments)    GI upset   Hydrocodone Other (See Comments)    Reaction happened in the spine   Latex Rash and Other (See Comments)    EKG leads cause rashes- cannot tolerate for any extended length of time    Medications Reviewed Today     Reviewed by Sarah Montane, RN (Registered Nurse) on 08/26/22 at [place] List Status: <None>   Medication Order Taking? Sig Documenting Provider Last Dose Status Informant  acetaminophen (TYLENOL) 500 MG tablet [id number] Yes Take 500-1,000 mg by mouth 4 (four) times daily as needed (for pain). [provider] Taking Active Self  carvedilol (COREG) 6.25 MG tablet [id number] Yes Take 1.5 tablets (9.375 mg total) by mouth 2 (two) times daily with a meal. Reilly, Sarah Pascal, MD Taking Active   digoxin (LANOXIN) 0.125 MG tablet [id number] Yes Take 1 tablet (0.125 mg total) by mouth daily. Sarah Rakes, MD Taking Active   lisinopril (ZESTRIL) 20 MG tablet [id number] Yes Take 1 tablet (20 mg total) by mouth daily. [place],  Sarah Ferretti, MD Taking Active   methimazole (TAPAZOLE) 10 MG tablet [id number] Yes Take 15 mg by mouth daily. [provider] Taking Active            Med Note (Sarah Reilly A   Fri  Aug 26, 2022 10:52 AM) Taking 1.5 tablets daily  Misc. Devices [place] [id number]  Blood pressure monitor.  Diagnosis hypertension Sarah Rakes, MD  Active            Med Note (Sarah Reilly A   Tue Jul 26, 2022 11:00 AM)    potassium chloride SA (KLOR-CON M) 20 MEQ tablet [id number] Yes Take 1 tablet (20 mEq total) by mouth daily. Minus Breeding, MD Taking Active Self           Med Note Sarah Reilly   Fri Jan 07, 2022  1:57 PM)    spironolactone (ALDACTONE) 25 MG tablet [id number] Yes Take 1 tablet (25 mg total) by mouth daily. OFFICE VISIT NEEDED FOR ADDITIONAL REFILLS Sarah Rakes, MD Taking Active   torsemide (DEMADEX) 20 MG tablet [id number] Yes Take 2 tablets (40 mg total) by mouth daily. 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   SOB (shortness of breath) 11/03/2017   Severe mitral regurgitation 07/20/2009   CHEST PAIN 07/20/2009   Essential hypertension 06/16/2009   AGORAPHOBIA 04/22/2009   MIGRAINE HEADACHE 04/22/2009   PYELONEPHRITIS 04/22/2009   SYNCOPE 04/22/2009   DIZZINESS 04/22/2009   PALPITATIONS 04/22/2009   ELECTROCARDIOGRAM, ABNORMAL 04/22/2009    Conditions to be addressed/monitored per PCP order:  CHF, HTN, and Thyroid Disorder  Care Plan : [place] of Care  Updates made by Sarah Montane, RN since 08/26/2022 12:00 AM     Problem: Chronic Disease Management and Care Coordination Needs for CHF, HTN, Hyperthyroidism   Priority: High  Onset Date: 11/23/2021      Long-Range Goal: Development of Plan of Care for Chronic Disease Management and Care Coordination Needs (CHF, HTN, Hyperthyroidism)   Start Date: 11/23/2021  Expected End Date: 11/25/2022  Priority: High  Note:    Current Barriers:  Knowledge Deficits related to plan of care for management of CHF, HTN, and Hyperthyroidism  Chronic Disease Management support and education needs related to CHF, HTN, and Hyperthyroidism Lacks caregiver support.        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  RNCM Clinical Goal(s):  Patient will verbalize understanding of plan for management of CHF, HTN, and Hyperthyroidism as evidenced by improved management of these chronic diseases verbalize basic understanding of CHF, HTN, and Hyperthyroidism disease process and self health management plan as evidenced by improved management of these chronic diseases take all medications exactly as prescribed and will call provider for medication related questions as evidenced by being compliant with all prescribed medications    attend all scheduled medical appointments: calling to schedule follow up with PCP as evidenced by attending all scheduled appointments        demonstrate improved adherence to prescribed  treatment plan for CHF, HTN, and Hyperthyroidism as evidenced by improved management of these diseases. continue to work with Consulting civil engineer and/or Social Worker to address care management and care coordination needs related to CHF, HTN, and Hyperthyroidism as evidenced by adherence to CM Team Scheduled appointments     through collaboration with Consulting civil engineer, provider, and care team.   Interventions: Inter-disciplinary care team collaboration (see longitudinal plan of care) Evaluation of current treatment plan related to  self management and patient's adherence to plan as established by provider  Heart Failure Interventions:  (Status: Goal on Track (progressing): YES.)  Long  Term Goal  Wt Readings from Last 3 Encounters:  08/18/22 230 lb 9.6 oz (104.6 kg)  06/09/22 224 lb (101.6 kg)  05/10/22 234 lb 6.4 oz (106.3 kg)  Provided education on low sodium diet Discussed importance of daily weight and advised patient to weigh and record daily Discussed the importance of keeping all appointments with provider Provided patient with education about the role of exercise in the management of heart failure Assessed social determinant of health barriers Reviewed providers notes from 08/18/22 visit   Hyperthyroidism  (Status: Goal on Track (progressing): YES.) Long Term Goal  Evaluation of current treatment plan related to  Hyperthyroidism , Financial constraints related to increased rent and having to reapply for Food Stamps (done)  self-management and patient's adherence to plan as established by provider. Discussed plans with patient for ongoing care management follow up and provided patient with direct contact information for care management team Reviewed medications with patient and discussed importance of medication compliance; Reviewed scheduled/upcoming provider appointments including : See appointments in [place] ; Assessed social determinant of health barriers;  Reviewed provider documentation from Endocrinology and discussed with patient, follow up in May 2024   Hypertension: (Status: Goal on Track (progressing): YES.) Long Term Goal  Last practice recorded BP readings:  BP Readings from Last 3 Encounters:  08/18/22 108/78  06/09/22 130/82  05/10/22 (!) 148/96  Most recent eGFR/CrCl:  Lab Results  Component Value Date   EGFR 103 12/09/2021    No components found for: "CRCL"  Reviewed medications with patient and discussed importance of compliance;  Counseled on the importance of exercise goals with target of 150 minutes per week Discussed plans with patient for ongoing care management follow up and provided patient with direct contact  information for care management team; Reviewed scheduled/upcoming provider appointments including: 08/18/22 for Echocardiogram and HF Clinic Discussed the importance of PCP follow up, advised patient to schedule follow up with PCP   Patient Goals/Self-Care Activities: Take medications as prescribed   Call pharmacy for medication refills 3-7 days in advance of running out of medications Perform all self care activities independently  Perform IADL's (shopping, preparing meals, housekeeping, managing finances) independently Call provider office for new concerns or questions  call office if I gain more than 2 pounds in one day or 5 pounds in one week watch for swelling in feet, ankles and legs every day weigh myself daily keep all doctor appointments take medications for blood pressure exactly as prescribed       Follow Up:  Patient agrees to Care Plan and Follow-up.  Plan: The Managed Medicaid care management team will reach out to the patient again over the next 60 days.  Date/time of next scheduled RN care management/care coordination outreach:  10/26/22 @ 10:30am  [name] RN, BSN [place] RN Care Coordinator

## 2022-08-26 NOTE — Patient Instructions (Signed)
Visit Information  Sarah Reilly was given information about Medicaid Managed Care team care coordination services as a part of their Healthy [facility] Medicaid benefit. [name] verbally consented to engagement with the [facility] Managed Care team.   If you are experiencing a medical emergency, please call 911 or report to your local emergency department or urgent care.   If you have a non-emergency medical problem during routine business hours, please contact your provider's office and ask to speak with a nurse.   For questions related to your Healthy [facility] health plan, please call: [phone] or visit the homepage here: [email or web address]  If you would like to schedule transportation through your Healthy [facility] plan, please call the following number at least 2 days in advance of your appointment: [phone]  For information about your ride after you set it up, call Ride Assist at [phone]. Use this number to activate a Will Call pickup, or if your transportation is late for a scheduled pickup. Use this number, too, if you need to make a change or cancel a previously scheduled reservation.  If you need transportation services right away, call [phone]. The after-hours call center is staffed 24 hours to handle ride assistance and urgent reservation requests (including discharges) 365 days a year. Urgent trips include sick visits, hospital discharge requests and life-sustaining treatment.  Call the [facility] at [phone], at any time, 24 hours a day, 7 days a week. If you are in danger or need immediate medical attention call 911.  If you would like help to quit smoking, call 1-800-QUIT-NOW 980 484 3192) OR Espaol: 1-855-Djelo-Ya QO:409462) o para ms informacin haga clic aqu or Text READY to 200-400 to register via text  Sarah Reilly,   Please see education materials related to HF provided by  MyChart link.  Patient verbalizes understanding of instructions and care plan provided today and agrees to view in [place]. Active MyChart status and patient understanding of how to access instructions and care plan via MyChart confirmed with patient.     Telephone follow up appointment with Managed Medicaid care management team member scheduled for:10/26/22 @ 10:30am  [name] RN, BSN [place] RN Care Coordinator   Following is a copy of your plan of care:  Care Plan : RN Care Manager Plan of Care  Updates made by [name], RN since 08/26/2022 12:00 AM     Problem: Chronic Disease Management and Care Coordination Needs for CHF, HTN, Hyperthyroidism   Priority: High  Onset Date: 11/23/2021     Long-Range Goal: Development of Plan of Care for Chronic Disease Management and Care Coordination Needs (CHF, HTN, Hyperthyroidism)   Start Date: 11/23/2021  Expected End Date: 11/25/2022  Priority: High  Note:    Current Barriers:  Knowledge Deficits related to plan of care for management of CHF, HTN, and Hyperthyroidism  Chronic Disease Management support and education needs related to CHF, HTN, and Hyperthyroidism Lacks caregiver support.        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  RNCM Clinical Goal(s):  Patient will verbalize understanding of plan for management of CHF, HTN, and Hyperthyroidism as evidenced by improved management of these chronic diseases verbalize basic understanding of CHF, HTN, and Hyperthyroidism disease process and self health management plan as evidenced by improved management of these chronic diseases take all medications exactly as prescribed and will call provider for medication related questions as evidenced by being compliant with all prescribed medications  attend all scheduled medical appointments: calling to schedule follow up with PCP as evidenced by attending all scheduled  appointments        demonstrate improved adherence to prescribed treatment plan for CHF, HTN, and Hyperthyroidism as evidenced by improved management of these diseases. continue to work with Consulting civil engineer and/or Social Worker to address care management and care coordination needs related to CHF, HTN, and Hyperthyroidism as evidenced by adherence to CM Team Scheduled appointments     through collaboration with Consulting civil engineer, provider, and care team.   Interventions: Inter-disciplinary care team collaboration (see longitudinal plan of care) Evaluation of current treatment plan related to  self management and patient's adherence to plan as established by provider  Heart Failure Interventions:  (Status: Goal on Track (progressing): YES.)  Long Term Goal  Wt Readings from Last 3 Encounters:  08/18/22 230 lb 9.6 oz (104.6 kg)  06/09/22 224 lb (101.6 kg)  05/10/22 234 lb 6.4 oz (106.3 kg)  Provided education on low sodium diet Discussed importance of daily weight and advised patient to weigh and record daily Discussed the importance of keeping all appointments with provider Provided patient with education about the role of exercise in the management of heart failure Assessed social determinant of health barriers Reviewed providers notes from 08/18/22 visit   Hyperthyroidism  (Status: Goal on Track (progressing): YES.) Long Term Goal  Evaluation of current treatment plan related to  Hyperthyroidism , Financial constraints related to increased rent and having to reapply for Food Stamps (done)  self-management and patient's adherence to plan as established by provider. Discussed plans with patient for ongoing care management follow up and provided patient with direct contact information for care management team Reviewed medications with patient and discussed importance of medication compliance; Reviewed scheduled/upcoming provider appointments including : See appointments in [facility]  ; Assessed social determinant of health barriers;  Reviewed provider documentation from Endocrinology and discussed with patient, follow up in May 2024   Hypertension: (Status: Goal on Track (progressing): YES.) Long Term Goal  Last practice recorded BP readings:  BP Readings from Last 3 Encounters:  08/18/22 108/78  06/09/22 130/82  05/10/22 (!) 148/96  Most recent eGFR/CrCl:  Lab Results  Component Value Date   EGFR 103 12/09/2021    No components found for: "CRCL"  Reviewed medications with patient and discussed importance of compliance;  Counseled on the importance of exercise goals with target of 150 minutes per week Discussed plans with patient for ongoing care management follow up and provided patient with direct contact information for care management team; Reviewed scheduled/upcoming provider appointments including: 08/18/22 for Echocardiogram and HF Clinic Discussed the importance of PCP follow up, advised patient to schedule follow up with PCP   Patient Goals/Self-Care Activities: Take medications as prescribed   Call pharmacy for medication refills 3-7 days in advance of running out of medications Perform all self care activities independently  Perform IADL's (shopping, preparing meals, housekeeping, managing finances) independently Call provider office for new concerns or questions  call office if I gain more than 2 pounds in one day or 5 pounds in one week watch for swelling in feet, ankles and legs every day weigh myself daily keep all doctor appointments take medications for blood pressure exactly as prescribed

## 2022-10-26 ENCOUNTER — Other Ambulatory Visit: Payer: Medicaid Other | Admitting: *Deleted

## 2022-10-26 NOTE — Patient Outreach (Signed)
Care Coordination  10/26/2022  Sarah Reilly 1980/11/04 [id number]    Successful outreach with [name] today, however she is at an appointment with her daughter and request to reschedule. A new telephone appointment was made for 10/27/22 at 11:15 am. Patient agreed to new date and time.  Estanislado Emms RN, BSN [place]  Managed [facility] RN Care Coordinator [phone]

## 2022-10-27 ENCOUNTER — Encounter: Payer: Self-pay | Admitting: *Deleted

## 2022-10-27 ENCOUNTER — Other Ambulatory Visit: Payer: Medicaid Other | Admitting: *Deleted

## 2022-10-27 NOTE — Patient Outreach (Signed)
   Embedded Care Coordination  Case Closure Note  10/27/2022 Name: Sarah Reilly MRN: [id number] DOB: 06/14/1981  [name] is a 42 y.o. year old female who is a primary care patient of [name], MD. The Embedded Care Coordination team was consulted for assistance with chronic disease management and care coordination needs related to CHF and HTN  The patient has met all care management goals, agreed to case closure, and has been provided with contact information for the care management team. Appropriate care team members and provider have been notified via electronic communication. The care management team is available to provide care management/care coordination support at any time in the future should needs arise. Further engagement requires referral order ([id number]).   If patient returns call to provider office and is in need of assistance from the embedded care coordination team, please advise that the patient call the [facility] Care Guide at [phone] for assistance.   Estanislado Emms RN, BSN [place]  Managed [facility] RN Care Coordinator [phone]

## 2022-11-24 DIAGNOSIS — E079 Disorder of thyroid, unspecified: Secondary | ICD-10-CM | POA: Diagnosis not present

## 2022-11-24 DIAGNOSIS — H5789 Other specified disorders of eye and adnexa: Secondary | ICD-10-CM | POA: Diagnosis not present

## 2022-11-24 DIAGNOSIS — E0500 Thyrotoxicosis with diffuse goiter without thyrotoxic crisis or storm: Secondary | ICD-10-CM | POA: Diagnosis not present

## 2022-11-30 ENCOUNTER — Other Ambulatory Visit (HOSPITAL_COMMUNITY): Payer: Self-pay | Admitting: Internal Medicine

## 2022-12-01 ENCOUNTER — Encounter (HOSPITAL_COMMUNITY): Payer: Self-pay

## 2022-12-01 ENCOUNTER — Ambulatory Visit (HOSPITAL_COMMUNITY)
Admission: RE | Admit: 2022-12-01 | Discharge: 2022-12-01 | Disposition: A | Discharge status: Home / Self Care | Payer: Medicaid Other | Source: Ambulatory Visit | Attending: Physician Assistant | Admitting: Physician Assistant

## 2022-12-01 VITALS — BP 130/94 | HR 100 | Wt 242.8 lb

## 2022-12-01 DIAGNOSIS — Z8249 Family history of ischemic heart disease and other diseases of the circulatory system: Secondary | ICD-10-CM | POA: Diagnosis not present

## 2022-12-01 DIAGNOSIS — I5022 Chronic systolic (congestive) heart failure: Secondary | ICD-10-CM | POA: Diagnosis not present

## 2022-12-01 DIAGNOSIS — I5042 Chronic combined systolic (congestive) and diastolic (congestive) heart failure: Secondary | ICD-10-CM | POA: Diagnosis not present

## 2022-12-01 DIAGNOSIS — I110 Hypertensive heart disease with heart failure: Secondary | ICD-10-CM | POA: Insufficient documentation

## 2022-12-01 DIAGNOSIS — Z79899 Other long term (current) drug therapy: Secondary | ICD-10-CM | POA: Diagnosis not present

## 2022-12-01 DIAGNOSIS — E669 Obesity, unspecified: Secondary | ICD-10-CM | POA: Insufficient documentation

## 2022-12-01 DIAGNOSIS — I340 Nonrheumatic mitral (valve) insufficiency: Secondary | ICD-10-CM | POA: Insufficient documentation

## 2022-12-01 DIAGNOSIS — E0590 Thyrotoxicosis, unspecified without thyrotoxic crisis or storm: Secondary | ICD-10-CM | POA: Diagnosis not present

## 2022-12-01 DIAGNOSIS — I428 Other cardiomyopathies: Secondary | ICD-10-CM | POA: Insufficient documentation

## 2022-12-01 DIAGNOSIS — R0602 Shortness of breath: Secondary | ICD-10-CM | POA: Insufficient documentation

## 2022-12-01 DIAGNOSIS — Z5986 Financial insecurity: Secondary | ICD-10-CM | POA: Insufficient documentation

## 2022-12-01 LAB — COMPREHENSIVE METABOLIC PANEL
ALT: 26 U/L (ref 0–44)
AST: 25 U/L (ref 15–41)
Albumin: 3.8 g/dL (ref 3.5–5.0)
Alkaline Phosphatase: 56 U/L (ref 38–126)
Anion gap: 8 (ref 5–15)
BUN: 14 mg/dL (ref 6–20)
CO2: 19 mmol/L — ABNORMAL LOW (ref 22–32)
Calcium: 9 mg/dL (ref 8.9–10.3)
Chloride: 109 mmol/L (ref 98–111)
Creatinine, Ser: 0.93 mg/dL (ref 0.44–1.00)
GFR, Estimated: >60 mL/min (ref >60)
Glucose, Bld: 123 mg/dL — ABNORMAL HIGH (ref 70–99)
Potassium: 3.8 mmol/L (ref 3.5–5.1)
Sodium: 136 mmol/L (ref 135–145)
Total Bilirubin: 1 mg/dL (ref 0.3–1.2)
Total Protein: 7.3 g/dL (ref 6.5–8.1)

## 2022-12-01 LAB — BRAIN NATRIURETIC PEPTIDE: B Natriuretic Peptide: 137.8 pg/mL — ABNORMAL HIGH (ref 0.0–100.0)

## 2022-12-01 MED ORDER — SPIRONOLACTONE 25 MG PO TABS: 25.0000 mg | ORAL_TABLET | Freq: Every day | ORAL | 11 refills | Status: DC

## 2022-12-01 MED ORDER — LISINOPRIL 20 MG PO TABS: 20.0000 mg | ORAL_TABLET | Freq: Every day | ORAL | 11 refills | Status: DC

## 2022-12-01 MED ORDER — CARVEDILOL 12.5 MG PO TABS: 12.5000 mg | ORAL_TABLET | Freq: Two times a day (BID) | ORAL | 3 refills | Status: DC

## 2022-12-01 MED ORDER — DIGOXIN 125 MCG PO TABS: 0.1250 mg | ORAL_TABLET | Freq: Every day | ORAL | 11 refills | Status: DC

## 2022-12-01 NOTE — Patient Instructions (Addendum)
RedsClip done today.  EKG done today.  Labs done today. We will contact you only if your labs are abnormal.  INCREASE Carvedilol to 12.5mg  (1 tablet) by mouth 2 times daily.   No other medication changes were made. Please continue all current medications as prescribed.  Your physician recommends that you schedule a follow-up appointment in: 4 months  If you have any questions or concerns before your next appointment please send [place] a message through [place] or call our office at [phone].    TO LEAVE A MESSAGE FOR THE NURSE SELECT OPTION 2, PLEASE LEAVE A MESSAGE INCLUDING: YOUR NAME DATE OF BIRTH CALL BACK NUMBER REASON FOR CALL**this is important as we prioritize the call backs  YOU WILL RECEIVE A CALL BACK THE SAME DAY AS LONG AS YOU CALL BEFORE 4:00 PM   Do the following things EVERYDAY: Weigh yourself in the morning before breakfast. Write it down and keep it in a log. Take your medicines as prescribed Eat low salt foods--Limit salt (sodium) to 2000 mg per day.  Stay as active as you can everyday Limit all fluids for the day to less than 2 liters   At the [facility], you and your health needs are our priority. As part of our continuing mission to provide you with exceptional heart care, we have created designated Provider Care Teams. These Care Teams include your primary Cardiologist (physician) and Advanced Practice Providers (APPs- Physician Assistants and Nurse Practitioners) who all work together to provide you with the care you need, when you need it.   You may see any of the following providers on your designated Care Team at your next follow up: Dr Arvilla Meres Dr Marca Ancona Dr. Marcos Eke, NP Robbie Lis, [place] [facility] [place], [place] Brynda Peon, NP Karle Plumber, PharmD   Please be sure to bring in all your medications bottles to every appointment.    Thank you for choosing [facility]

## 2022-12-01 NOTE — Progress Notes (Signed)
Advanced Heart Failure Clinic Note  PCP: [name], MD Endocrinology: Dr Lucianne Muss Primary Cardiologist: Dr Antoine Poche  HF Cardiologist: Dr Gala Romney   HPI: Sarah Reilly is a 42 y.o. 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    She developed HF after the birth of her 4th child in 2010. EF 20%. Did not have a heart cath.  She then moved back to [place] to be by her mother. Apparently EF dropped to 15-20% for a period. They suggested ICD but she refused. She returned to [place].  In July 2019 she was diagnosed with hyperthyroidism. Started on methimazole by Dr Lucianne Muss.   Previously she was having CP in setting of 30 pound weight gain. Dr. Gala Romney suggested R/L cath but she refused saying she wanted to try and lose weight first and see how she felt. We eventually rescheduled but she couldn't get COVID test.   She was seen in [facility]/21 and was quite tachycardic. She had not seen Dr. Lucianne Muss since 12/20. Ran out of methimazole refils. 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  TSH < 0.010. She has not followed up with Endo. On the chart there is a dismissal letter from Dr. Lucianne Muss dated 02/27/20.  Echo 04/02/20 EF 35-40%, RV normal. Mod-severe MR w/ tethering of posterior leaflet.   Echo 9/22 EF 30-35% RV mildly. Bi atrial enlargement.    Saw Dr Antoine Poche 11/05/21 with increased shortness of breath, orthopnea, and PND. Admitted with A/C HFrEF. TSH 0.005. Echo EF 25-30%. cMRI showed LVEF 25%, RV reduced, no LGE. No clear etiology. GDMT titrated. No Entresto d/t intolerance, started on ACE. Discharged home, weight 210 lbs.  Echo 08/18/22: EF 20-25% RV mildly down mod-severe MR  She is here today for HF follow-up. Doing well from HF standpoint. Takes Torsemide about once a week as needed for volume. She  lost 8 lb with Torsemide last week. She does get shortness of breath with volume overload. Has been out of digoxin and methimazole for 2 weeks, methimazole is being delivered to her home. She watches sodium and fluid intake. Does not exercise but able to complete ADLs.   Cardiac Studies: - cMRI (5/23): LVEF 25%, RVEF 30%, no LGE, moderate MR and TR.  - Echo (5/23): EF 25-30%, severe LV dysfunction with global HK, RV mildly down, mild to moderate MR, moderate TR  - Echo (03/09/21): EF 30-35%, RV mildly reduced, severe MR, mild-mod TR   - Echo (04/02/20): EF 35-40%   - Echo (2/21): LV dilated/spherical 30-35% likely severe MR. RV ok   - Echo (12/2018): EF 35-40% Mod-Severe MR, RV normal  - Echo (11/16/2017): EF 20-25% Severe MR   ROS: All systems negative except as listed in HPI, PMH and Problem List.  SH:  Social History   Socioeconomic History   Marital status: Single    Spouse name: Not on file   Number of children: Not on file   Years of education: Not on file   Highest education level: Not on file  Occupational History   Not on file  Tobacco Use   Smoking status: Former   Smokeless tobacco: Never  Substance and Sexual Activity   Alcohol use: Yes    Alcohol/week: 4.0 standard drinks of alcohol    Types: 3 Glasses of wine, 1 Standard drinks or equivalent per week  Comment: Occassional   Drug use: Never   Sexual activity: Not on file  Other Topics Concern   Not on file  Social History Narrative   She has 4 children ages 22 and under.  She stays at home. She does not smoke cigarettes or drink alcohol. Does not get regular exercise.    Social Determinants of Health   Financial Resource Strain: High Risk (11/23/2021)   Overall Financial Resource Strain (CARDIA)    Difficulty of Paying Living Expenses: Hard  Food Insecurity: No Food Insecurity (08/26/2022)   Hunger Vital Sign    Worried About Running Out of Food in the Last Year: Never true    Ran Out of Food in the Last  Year: Never true  Transportation Needs: Unmet Transportation Needs (06/08/2022)   PRAPARE - Administrator, Civil Service (Medical): Yes    Lack of Transportation (Non-Medical): Yes  Physical Activity: Inactive (11/23/2021)   Exercise Vital Sign    Days of Exercise per Week: 0 days    Minutes of Exercise per Session: 0 min  Stress: Stress Concern Present (11/23/2021)   [employer] of Occupational Health - Occupational Stress Questionnaire    Feeling of Stress : To some extent  Social Connections: Unknown (11/23/2021)   Social Connection and Isolation Panel [NHANES]    Frequency of Communication with Friends and Family: More than three times a week    Frequency of Social Gatherings with Friends and Family: More than three times a week    Attends Religious Services: Never    Database administrator or Organizations: No    Attends Engineer, structural: Never    Marital Status: Not on file  Intimate Partner Violence: Not on file   FH:  Family History  Problem Relation Age of Onset   Heart disease Mother        No details   Hypertension Mother    Diabetes Mother    Hypertension Father    Heart murmur Sister    Past Medical History:  Diagnosis Date   Cardiomyopathy    CHF (congestive heart failure) (HCC)    HTN (hypertension)    Mitral regurgitation    Current Outpatient Medications  Medication Sig Dispense Refill   acetaminophen (TYLENOL) 500 MG tablet Take 500-1,000 mg by mouth 4 (four) times daily as needed (for pain).     carvedilol (COREG) 6.25 MG tablet Take 1.5 tablets (9.375 mg total) by mouth 2 (two) times daily with a meal. 200 tablet 3   digoxin (LANOXIN) 0.125 MG tablet Take 0.125 mg by mouth daily.     lisinopril (ZESTRIL) 20 MG tablet Take 1 tablet (20 mg total) by mouth daily. 30 tablet 3   Misc. Devices MISC Blood pressure monitor.  Diagnosis hypertension 1 each 0   potassium chloride SA (KLOR-CON M) 20 MEQ tablet Take 1 tablet (20 mEq  total) by mouth daily. 90 tablet 3   spironolactone (ALDACTONE) 25 MG tablet Take 1 tablet (25 mg total) by mouth daily. OFFICE VISIT NEEDED FOR ADDITIONAL REFILLS 30 tablet 0   torsemide (DEMADEX) 20 MG tablet Take 2 tablets (40 mg total) by mouth daily. (Patient taking differently: Take 40 mg by mouth daily. As needed) 30 tablet 7   digoxin (LANOXIN) 0.125 MG tablet Take 1 tablet (0.125 mg total) by mouth daily. 30 tablet 11   methimazole (TAPAZOLE) 10 MG tablet Take 15 mg by mouth daily. (Patient not taking: Reported on 12/01/2022)  No current facility-administered medications for this encounter.   BP (!) 130/94   Pulse 100   Wt 110.1 kg (242 lb 12.8 oz)   SpO2 98%   BMI 38.03 kg/m   Wt Readings from Last 3 Encounters:  12/01/22 110.1 kg (242 lb 12.8 oz)  08/18/22 104.6 kg (230 lb 9.6 oz)  06/09/22 101.6 kg (224 lb)   PHYSICAL EXAM: General:  Well appearing. Ambulated into clinic HEENT: normal Neck: supple. no JVD. Carotids 2+ bilat; no bruits. + thryomegaly Cor: PMI nondisplaced. Regular rate & rhythm. No rubs, gallops or murmurs. Lungs: clear Abdomen: obese, soft, nontender, nondistended.  Extremities: no cyanosis, clubbing, rash, edema Neuro: alert & orientedx3. Affect pleasant  ECG: sinus 95 bpm  ReDs: 28%  ASSESSMENT & PLAN:  Chronic Systolic Heart Failure - Onset in 2010. 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 - Echo (5/19): EF 20-25% with severe posterior MR.   - Echo (7/20): EF 35-40% mod-severe MR with restricted posterior leaflet  - Echo (2/21): EF 30-35% mod-severe MR RV ok  - Echo (10/21): EF 35-40% moderate to severe MR  - Echo (9/22): EF 30-35%, RV mildly reduced, Severe MR, Mild TR  - Echo (5/23): EF 25-30%, severe LV dilation, mildly decreased RV function, severe LAE, moderate-severe MR with thickened mitral valve, moderate TR, IVC dilated.  - cMRI (5/23): LVEF 25% RVEF 30%, No LGE. - Echo 08/18/22: EF 20-25% RV  mildly down mod-severe MR Personally reviewed - NYHA II. Volume okay by exam and ReDS (28%). Clinic weight up 12 lb from last visit but not explained by CHF.  - Using torsemide 40 mg PRN.  - Continue lisinopril 20 mg daily (no Entresto due to GI upset). Refilled. - Continue spironolactone 25 mg daily. Refilled. - Continue digoxin 0.125 mg daily. Refilled. - Increase carvedilol to 12.5 mg BID - No SGLT2i d/t multiple episodes of pyelonephritis in past. Denies recent GU symptoms.  - She remains adamant that she does not want to consider ICD - Labs today   2. Hyperthyroidism  - Previously on methimazole and followed by Dr Lucianne Muss.  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   - She was dismissed from [facility].   - She has Endocrine follow up at [facility].  TSH 2.3, Free T4 1.48 and Free T3 2.7 on 05/30. - Continue methimazole 15 mg daily, has been off for 2 weeks. Awaiting refill delivery. - Off prednisone due to stomach upset  - Continue beta blocker.    3. Mitral regurgitation  - Moderate-severe on echo 5/23.  With severe LV and LA dilation, suspect component of functional MR but the mitral valve is also thickened with posterior leaflet restricted   - Echo 08/18/22: EF 20-25% RV mildly down mod-severe MR  - She is not inclined to invasive management/evaluation, hopefully MR will improve with medical management.   Follow-up: 4 months, sooner if needed  Welden Hausmann N, PA-C  12:28 PM

## 2022-12-01 NOTE — Progress Notes (Signed)
ReDS Vest / Clip - 12/01/22 1200       ReDS Vest / Clip   Station Marker D    Ruler Value 34    ReDS Value Range Low volume    ReDS Actual Value 28    Anatomical Comments sitting

## 2023-03-29 NOTE — Progress Notes (Signed)
Advanced Heart Failure Clinic Note  PCP: [name], MD Endocrinology: Dr Lucianne Muss Primary Cardiologist: Dr Antoine Poche  HF Cardiologist: Dr Gala Romney   HPI: Sarah Reilly is a 42 y.o. 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    She developed HF after the birth of her 4th child in 2010. EF 20%. Did not have a heart cath.  She then moved back to [place] to be by her mother. Apparently EF dropped to 15-20% for a period. They suggested ICD but she refused. She returned to [place].  In July 2019 she was diagnosed with hyperthyroidism. Started on methimazole by Dr Lucianne Muss.   Previously she was having CP in setting of 30 pound weight gain. Dr. Gala Romney suggested R/L cath but she refused saying she wanted to try and lose weight first and see how she felt. We eventually rescheduled but she couldn't get COVID test.   She was seen in [facility] 8/21 and was quite tachycardic. She had not seen Dr. Lucianne Muss since 12/20. Ran out of methimazole refils. 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  TSH < 0.010. She has not followed up with Endo. On the chart there is a dismissal letter from Dr. Lucianne Muss dated 02/27/20.  Echo 04/02/20 EF 35-40%, RV normal. Mod-severe MR w/ tethering of posterior leaflet.   Echo 9/22 EF 30-35% RV mildly. Bi atrial enlargement.    Saw Dr Antoine Poche 11/05/21 with increased shortness of breath, orthopnea, and PND. Admitted with A/C HFrEF. TSH 0.005. Echo EF 25-30%. cMRI showed LVEF 25%, RV reduced, no LGE. No clear etiology. GDMT titrated. No Entresto d/t intolerance, started on ACE. Discharged home, weight 210 lbs.  Echo 08/18/22: EF 20-25% RV mildly down mod-severe MR  Today she returns for HF follow up. Overall feeling fair. Multiple complaints today, has nerve pain to left side of head, CP  constantly in left chest. She attributes it to muscle spasms after she takes diuretics, lessens when she takes Tylenol. She has SOB walking further distances on flat ground. She is chronically dizzy, no falls. Feels occasional palpitations. Denies abnormal bleeding,  edema, or PND/Orthopnea. Appetite ok. No fever or chills. Weight at home 232 pounds. Taking all medications. Takes torsemide once/week, takes when she has a cough as this is her symptoms of volume overload. BP at home 130s/80s.  Cardiac Studies:  - Echo (2/24): EF 20-25% RV mildly down mod-severe MR  - cMRI (5/23): LVEF 25%, RVEF 30%, no LGE, moderate MR and TR.  - Echo (5/23): EF 25-30%, severe LV dysfunction with global HK, RV mildly down, mild to moderate MR, moderate TR  - Echo (03/09/21): EF 30-35%, RV mildly reduced, severe MR, mild-mod TR   - Echo (04/02/20): EF 35-40%   - Echo (2/21): LV dilated/spherical 30-35% likely severe MR. RV ok   - Echo (12/2018): EF 35-40% Mod-Severe MR, RV normal  - Echo (11/16/2017): EF 20-25% Severe MR   ROS: All systems negative except as listed in HPI, PMH and Problem List.  SH:  Social History   Socioeconomic History   Marital status: Single    Spouse name: Not on file   Number of children: Not on file   Years of education: Not on file   Highest education level: Not on file  Occupational History   Not on file  Tobacco Use  Smoking status: Former   Smokeless tobacco: Never  Substance and Sexual Activity   Alcohol use: Yes    Alcohol/week: 4.0 standard drinks of alcohol    Types: 3 Glasses of wine, 1 Standard drinks or equivalent per week    Comment: Occassional   Drug use: Never   Sexual activity: Not on file  Other Topics Concern   Not on file  Social History Narrative   She has 4 children ages 91 and under.  She stays at home. She does not smoke cigarettes or drink alcohol. Does not get regular exercise.    Social Determinants of Health   Financial Resource Strain:  High Risk (11/23/2021)   Overall Financial Resource Strain (CARDIA)    Difficulty of Paying Living Expenses: Hard  Food Insecurity: No Food Insecurity (08/26/2022)   Hunger Vital Sign    Worried About Running Out of Food in the Last Year: Never true    Ran Out of Food in the Last Year: Never true  Transportation Needs: Unmet Transportation Needs (06/08/2022)   PRAPARE - Administrator, Civil Service (Medical): Yes    Lack of Transportation (Non-Medical): Yes  Physical Activity: Inactive (11/23/2021)   Exercise Vital Sign    Days of Exercise per Week: 0 days    Minutes of Exercise per Session: 0 min  Stress: Stress Concern Present (11/23/2021)   [employer] of Occupational Health - Occupational Stress Questionnaire    Feeling of Stress : To some extent  Social Connections: Unknown (03/22/2022)   Received from [facility]   Social Network    Social Network: Not on file  Intimate Partner Violence: Unknown (03/22/2022)   Received from [facility], [facility]   HITS    Physically Hurt: Not on file    Insult or Talk Down To: Not on file    Threaten Physical Harm: Not on file    Scream or Curse: Not on file   FH:  Family History  Problem Relation Age of Onset   Heart disease Mother        No details   Hypertension Mother    Diabetes Mother    Hypertension Father    Heart murmur Sister    Past Medical History:  Diagnosis Date   Cardiomyopathy    CHF (congestive heart failure) (HCC)    HTN (hypertension)    Mitral regurgitation    Current Outpatient Medications  Medication Sig Dispense Refill   acetaminophen (TYLENOL) 500 MG tablet Take 500-1,000 mg by mouth 4 (four) times daily as needed (for pain).     carvedilol (COREG) 12.5 MG tablet Take 1 tablet (12.5 mg total) by mouth 2 (two) times daily with a meal. 180 tablet 3   digoxin (LANOXIN) 0.125 MG tablet Take 1 tablet (0.125 mg total) by mouth daily. 30 tablet 11   lisinopril (ZESTRIL) 20  MG tablet Take 1 tablet (20 mg total) by mouth daily. 30 tablet 11   methimazole (TAPAZOLE) 10 MG tablet Take 20 mg by mouth daily.     Misc. Devices MISC Blood pressure monitor.  Diagnosis hypertension 1 each 0   potassium chloride SA (KLOR-CON M) 20 MEQ tablet Take 1 tablet (20 mEq total) by mouth daily. 90 tablet 3   spironolactone (ALDACTONE) 25 MG tablet Take 1 tablet (25 mg total) by mouth daily. OFFICE VISIT NEEDED FOR ADDITIONAL REFILLS 30 tablet 11   torsemide (DEMADEX) 20 MG tablet Take 2 tablets (40 mg total) by mouth  daily. (Patient taking differently: Take 40 mg by mouth daily. As needed) 30 tablet 7   No current facility-administered medications for this encounter.   BP (!) 134/108   Pulse (!) 106   Wt 108.9 kg (240 lb)   SpO2 97%   BMI 37.59 kg/m   Wt Readings from Last 3 Encounters:  04/03/23 108.9 kg (240 lb)  12/01/22 110.1 kg (242 lb 12.8 oz)  08/18/22 104.6 kg (230 lb 9.6 oz)   PHYSICAL EXAM: General:  NAD. No resp difficulty, walked into clinic HEENT: Normal Neck: Supple. No JVD, thick neck. Carotids 2+ bilat; no bruits. No lymphadenopathy. + thryomegaly  Cor: PMI nondisplaced. Tachy rate & rhythm. No rubs, gallops, 3/6 MR Lungs: Clear Abdomen: Soft, obese, nontender, nondistended. No hepatosplenomegaly. No bruits or masses. Good bowel sounds. Extremities: No cyanosis, clubbing, rash, edema Neuro: Alert & oriented x 3, cranial nerves grossly intact. Moves all 4 extremities w/o difficulty. Affect pleasant.  ECG (personally reviewed): ST 101 bpm  ReDs: 22%  ASSESSMENT & PLAN: Chronic Systolic Heart Failure - Onset in 2010. 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 - Echo (5/19): EF 20-25% with severe posterior MR.   - Echo (7/20): EF 35-40% mod-severe MR with restricted posterior leaflet  - Echo (2/21): EF 30-35% mod-severe MR RV ok  - Echo (10/21): EF 35-40% moderate to severe MR  - Echo (9/22): EF 30-35%, RV mildly  reduced, Severe MR, Mild TR  - Echo (5/23): EF 25-30%, severe LV dilation, mildly decreased RV function, severe LAE, moderate-severe MR with thickened mitral valve, moderate TR, IVC dilated.  - cMRI (5/23): LVEF 25% RVEF 30%, No LGE. - Echo (2/24): EF 20-25% RV mildly down mod-severe MR  - NYHA II. Volume looks OK today on exam. But ReDs 22% - Liberalize fluids today. - Decrease PRN torsemide to 20 mg, take 20 KCL PRN when taking torsemide. - Increase Coreg to 18.75 mg bid. - Continue lisinopril 20 mg daily (no Entresto due to GI upset).  - Continue spironolactone 25 mg daily.  - Continue digoxin 0.125 mg daily.  - No SGLT2i d/t multiple episodes of pyelonephritis in past. Denies recent GU symptoms.  - She remains adamant that she does not want to consider ICD - Labs today   2. Hyperthyroidism  - Previously on methimazole and followed by Dr Lucianne Muss.  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   - She was dismissed from [facility] Endocrinology.   - She has Endocrine follow up at [facility].  TSH 2.3, Free T4 1.48 and Free T3 2.7 on 5/30. - Continue methimazole 20 mg daily  - Off prednisone due to stomach upset  - Continue beta blocker.  - Check thyroid labs and forward to Endocrinology Dr. Shawnee Knapp   3. Mitral regurgitation  - Moderate-severe on echo 5/23.  With severe LV and LA dilation, suspect component of functional MR but the mitral valve is also thickened with posterior leaflet restricted   - Echo 08/18/22: EF 20-25% RV mildly down mod-severe MR  - She is not inclined to invasive management/evaluation, hopefully MR will improve with medical management. - We discussed referral to Structural Heart team, she will think about this.  Follow up in 3-4 months with Dr. Gala Romney  Jacklynn Ganong, FNP  11:22 AM

## 2023-04-03 ENCOUNTER — Ambulatory Visit (HOSPITAL_COMMUNITY)
Admission: RE | Admit: 2023-04-03 | Discharge: 2023-04-03 | Disposition: A | Discharge status: Home / Self Care | Payer: Medicaid Other | Source: Ambulatory Visit | Attending: Family Medicine

## 2023-04-03 ENCOUNTER — Encounter (HOSPITAL_COMMUNITY): Payer: Self-pay

## 2023-04-03 VITALS — BP 130/98 | HR 106 | Wt 240.0 lb

## 2023-04-03 DIAGNOSIS — I5022 Chronic systolic (congestive) heart failure: Secondary | ICD-10-CM | POA: Insufficient documentation

## 2023-04-03 DIAGNOSIS — R9431 Abnormal electrocardiogram [ECG] [EKG]: Secondary | ICD-10-CM | POA: Diagnosis not present

## 2023-04-03 DIAGNOSIS — E0580 Other thyrotoxicosis without thyrotoxic crisis or storm: Secondary | ICD-10-CM | POA: Insufficient documentation

## 2023-04-03 DIAGNOSIS — I340 Nonrheumatic mitral (valve) insufficiency: Secondary | ICD-10-CM | POA: Diagnosis not present

## 2023-04-03 DIAGNOSIS — I428 Other cardiomyopathies: Secondary | ICD-10-CM | POA: Diagnosis not present

## 2023-04-03 DIAGNOSIS — E0590 Thyrotoxicosis, unspecified without thyrotoxic crisis or storm: Secondary | ICD-10-CM | POA: Diagnosis not present

## 2023-04-03 LAB — COMPREHENSIVE METABOLIC PANEL
ALT: 34 U/L (ref 0–44)
AST: 36 U/L (ref 15–41)
Albumin: 3.9 g/dL (ref 3.5–5.0)
Alkaline Phosphatase: 53 U/L (ref 38–126)
Anion gap: 13 (ref 5–15)
BUN: 13 mg/dL (ref 6–20)
CO2: 18 mmol/L — ABNORMAL LOW (ref 22–32)
Calcium: 9.2 mg/dL (ref 8.9–10.3)
Chloride: 106 mmol/L (ref 98–111)
Creatinine, Ser: 0.89 mg/dL (ref 0.44–1.00)
GFR, Estimated: >60 mL/min (ref >60)
Glucose, Bld: 108 mg/dL — ABNORMAL HIGH (ref 70–99)
Potassium: 3.8 mmol/L (ref 3.5–5.1)
Sodium: 137 mmol/L (ref 135–145)
Total Bilirubin: 1.3 mg/dL — ABNORMAL HIGH (ref 0.3–1.2)
Total Protein: 7.6 g/dL (ref 6.5–8.1)

## 2023-04-03 LAB — TSH: TSH: 0.906 u[IU]/mL (ref 0.350–4.500)

## 2023-04-03 LAB — BRAIN NATRIURETIC PEPTIDE: B Natriuretic Peptide: 367 pg/mL — ABNORMAL HIGH (ref 0.0–100.0)

## 2023-04-03 LAB — DIGOXIN LEVEL: Digoxin Level: <0.2 ng/mL — ABNORMAL LOW (ref 0.8–2.0)

## 2023-04-03 LAB — MAGNESIUM: Magnesium: 1.9 mg/dL (ref 1.7–2.4)

## 2023-04-03 LAB — T4, FREE: Free T4: 1.09 ng/dL (ref 0.61–1.12)

## 2023-04-03 MED ORDER — CARVEDILOL 12.5 MG PO TABS: 18.7500 mg | ORAL_TABLET | Freq: Two times a day (BID) | ORAL | 6 refills | Status: DC

## 2023-04-03 NOTE — Progress Notes (Signed)
ReDS Vest / Clip - 04/03/23 1100       ReDS Vest / Clip   Station Marker D    Ruler Value 37    ReDS Value Range Low volume    ReDS Actual Value 22

## 2023-04-03 NOTE — Patient Instructions (Addendum)
Thank you for coming in today  If you had labs drawn today, any labs that are abnormal the clinic will call you No news is good news  Medications: Increase Coreg 18.75 mg twice daily Decrease Torsemide to 20 mg as needed please take 20 meq Potassium with Torsemide dose  Follow up appointments:  Your physician recommends that you schedule a follow-up appointment in:  3-4 month with With Dr. Gala Romney   Please call your primary care office for a follow up appointment   Do the following things EVERYDAY: Weigh yourself in the morning before breakfast. Write it down and keep it in a log. Take your medicines as prescribed Eat low salt foods--Limit salt (sodium) to 2000 mg per day.  Stay as active as you can everyday Limit all fluids for the day to less than 2 liters   At the Advanced Heart Failure Clinic, you and your health needs are our priority. As part of our continuing mission to provide you with exceptional heart care, we have created designated Provider Care Teams. These Care Teams include your primary Cardiologist (physician) and Advanced Practice Providers (APPs- Physician Assistants and Nurse Practitioners) who all work together to provide you with the care you need, when you need it.   You may see any of the following providers on your designated Care Team at your next follow up: Dr Arvilla Meres Dr Marca Ancona Dr. Marcos Eke, NP Robbie Lis, [place] [facility] [place], [place] Brynda Peon, NP Karle Plumber, PharmD   Please be sure to bring in all your medications bottles to every appointment.    Thank you for choosing [facility]  If you have any questions or concerns before your next appointment please send [place] a message through [name] or call our office at [phone].    TO LEAVE A MESSAGE FOR THE NURSE SELECT OPTION 2, PLEASE LEAVE A MESSAGE INCLUDING: YOUR NAME DATE OF BIRTH CALL BACK  NUMBER REASON FOR CALL**this is important as we prioritize the call backs  YOU WILL RECEIVE A CALL BACK THE SAME DAY AS LONG AS YOU CALL BEFORE 4:00 PM

## 2023-04-04 LAB — T3, FREE: T3, Free: 2.7 pg/mL (ref 2.0–4.4)

## 2023-04-18 DIAGNOSIS — E0500 Thyrotoxicosis with diffuse goiter without thyrotoxic crisis or storm: Secondary | ICD-10-CM | POA: Diagnosis not present

## 2023-04-18 DIAGNOSIS — E079 Disorder of thyroid, unspecified: Secondary | ICD-10-CM | POA: Diagnosis not present

## 2023-04-18 DIAGNOSIS — H5789 Other specified disorders of eye and adnexa: Secondary | ICD-10-CM | POA: Diagnosis not present

## 2023-04-26 ENCOUNTER — Encounter (HOSPITAL_COMMUNITY): Payer: Self-pay | Admitting: Emergency Medicine

## 2023-04-26 ENCOUNTER — Emergency Department (HOSPITAL_COMMUNITY)
Admission: EM | Admit: 2023-04-26 | Discharge: 2023-04-27 | Discharge status: Against Medical Advice | Payer: Medicaid Other | Attending: Emergency Medicine | Admitting: Emergency Medicine

## 2023-04-26 ENCOUNTER — Other Ambulatory Visit: Payer: Self-pay

## 2023-04-26 DIAGNOSIS — H9201 Otalgia, right ear: Secondary | ICD-10-CM | POA: Insufficient documentation

## 2023-04-26 DIAGNOSIS — Z5321 Procedure and treatment not carried out due to patient leaving prior to being seen by health care provider: Secondary | ICD-10-CM | POA: Insufficient documentation

## 2023-04-26 DIAGNOSIS — R42 Dizziness and giddiness: Secondary | ICD-10-CM | POA: Diagnosis not present

## 2023-04-26 LAB — COMPREHENSIVE METABOLIC PANEL
ALT: 32 U/L (ref 0–44)
AST: 35 U/L (ref 15–41)
Albumin: 3.6 g/dL (ref 3.5–5.0)
Alkaline Phosphatase: 50 U/L (ref 38–126)
Anion gap: 13 (ref 5–15)
BUN: 13 mg/dL (ref 6–20)
CO2: 16 mmol/L — ABNORMAL LOW (ref 22–32)
Calcium: 9.1 mg/dL (ref 8.9–10.3)
Chloride: 104 mmol/L (ref 98–111)
Creatinine, Ser: 0.76 mg/dL (ref 0.44–1.00)
GFR, Estimated: >60 mL/min (ref >60)
Glucose, Bld: 110 mg/dL — ABNORMAL HIGH (ref 70–99)
Potassium: 4.2 mmol/L (ref 3.5–5.1)
Sodium: 133 mmol/L — ABNORMAL LOW (ref 135–145)
Total Bilirubin: 1.2 mg/dL (ref 0.3–1.2)
Total Protein: 7.1 g/dL (ref 6.5–8.1)

## 2023-04-26 LAB — CBC
HCT: 44.5 % (ref 36.0–46.0)
Hemoglobin: 13.9 g/dL (ref 12.0–15.0)
MCH: 26.6 pg (ref 26.0–34.0)
MCHC: 31.2 g/dL (ref 30.0–36.0)
MCV: 85.2 fL (ref 80.0–100.0)
Platelets: 194 10*3/uL (ref 150–400)
RBC: 5.22 MIL/uL — ABNORMAL HIGH (ref 3.87–5.11)
RDW: 13 % (ref 11.5–15.5)
WBC: 5.7 10*3/uL (ref 4.0–10.5)
nRBC: 0 % (ref 0.0–0.2)

## 2023-04-26 NOTE — ED Triage Notes (Signed)
Pt reports right ear pain and dizziness. Pt stating "it feels like I am spinning around."

## 2023-08-25 ENCOUNTER — Other Ambulatory Visit (HOSPITAL_COMMUNITY): Payer: Self-pay | Admitting: Internal Medicine

## 2023-08-25 DIAGNOSIS — I110 Hypertensive heart disease with heart failure: Secondary | ICD-10-CM

## 2023-09-29 ENCOUNTER — Telehealth (HOSPITAL_COMMUNITY): Payer: Self-pay | Admitting: Internal Medicine

## 2023-10-05 ENCOUNTER — Other Ambulatory Visit (HOSPITAL_COMMUNITY): Payer: Self-pay | Admitting: *Deleted

## 2023-10-05 DIAGNOSIS — I5022 Chronic systolic (congestive) heart failure: Secondary | ICD-10-CM

## 2023-10-20 ENCOUNTER — Encounter (HOSPITAL_COMMUNITY): Payer: Self-pay | Admitting: Internal Medicine

## 2023-10-20 NOTE — Telephone Encounter (Signed)
 Returned call to patient Reports she just refilled all medications Reports she took prn torsemide  x 2 days this week Reports she has not missed any doses of other medications   Reports increased dizziness, chest pains, and extreme fatigue ?related to torsemide  Denies SOB Weight 250 4/24 normal weight 260 Weight today 252.2 Denies n/v Reports pains will radiate into L arm/neck  Reports palpitations  Unsure if symptoms are related to diuretic however she is concerned, hesitant to report to ER   No cath   Please advise

## 2023-10-23 NOTE — Progress Notes (Incomplete)
 Advanced Heart Failure Clinic Note  PCP: Joaquin Mulberry, MD Endocrinology: Dr Hubert Madden Primary Cardiologist: Dr Lavonne Prairie  HF Cardiologist: Dr Julane Ny   HPI: Sarah Reilly is a 43 y.o. 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    She developed HF after the birth of her 4th child in 2010. EF 20%. Did not have a heart cath.  She then moved back to [place]  to be by her mother. Apparently EF dropped to 15-20% for a period. They suggested ICD but she refused. She returned to [place].  In July 2019 she was diagnosed with hyperthyroidism. Started on methimazole  by Dr Hubert Madden.   Previously she was having CP in setting of 30 pound weight gain. Dr. Julane Ny suggested R/L cath but she refused saying she wanted to try and lose weight first and see how she felt. We eventually rescheduled but she couldn't get COVID test.   She was seen in [facility] 8/21 and was quite tachycardic. She had not seen Dr. Hubert Madden since 12/20. Ran out of methimazole  refils. 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  TSH < 0.010. She has not followed up with Endo. On the chart there is a dismissal letter from Dr. Hubert Madden dated 02/27/20.  Echo 04/02/20 EF 35-40%, RV normal. Mod-severe MR w/ tethering of posterior leaflet.   Echo 9/22 EF 30-35% RV mildly. Bi atrial enlargement.    Saw Dr Lavonne Prairie 11/05/21 with increased shortness of breath, orthopnea, and PND. Admitted with A/C HFrEF. TSH 0.005. Echo EF 25-30%. cMRI showed LVEF 25%, RV reduced, no LGE. No clear etiology. GDMT titrated. No Entresto  d/t intolerance, started on ACE. Discharged home, weight 210 lbs.  Echo 08/18/22: EF 20-25% RV mildly down mod-severe MR  Today she returns for AHF follow up. Overall feeling chest tightness that has happened several years ago. Denies  dizziness,  edema, or PND/Orthopnea. Reports recent intermittent palpitations. Denies SOB. Appetite ok. No fever or chills. Weight at home 260 pounds, 250 post Torsemide . Stopped taking her digoxin  and spiro last week as she was afraid they were making her feel bad. Denies tobacco or drug use. Drank several days of the week last week due to multiple celebrations, when the chest discomfort started.   Cardiac Studies: - Echo (2/24): EF 20-25% RV mildly down mod-severe MR - cMRI (5/23): LVEF 25%, RVEF 30%, no LGE, moderate MR and TR. - Echo (5/23): EF 25-30%, severe LV dysfunction with global HK, RV mildly down, mild to moderate MR, moderate TR - Echo (03/09/21): EF 30-35%, RV mildly reduced, severe MR, mild-mod TR  - Echo (04/02/20): EF 35-40%  - Echo (2/21): LV dilated/spherical 30-35% likely severe MR. RV ok  - Echo (12/2018): EF 35-40% Mod-Severe MR, RV normal - Echo (11/16/2017): EF 20-25% Severe MR   ROS: All systems negative except as listed in HPI, PMH and Problem List.  SH:  Social History   Socioeconomic History   Marital status: Single    Spouse name: Not on file   Number of children: Not on file   Years of education: Not on file   Highest education level: Not on file  Occupational History   Not on file  Tobacco Use   Smoking status: Former   Smokeless tobacco: Never  Substance and Sexual Activity   Alcohol use: Yes    Alcohol/week: 4.0 standard drinks  of alcohol    Types: 3 Glasses of wine, 1 Standard drinks or equivalent per week    Comment: Occassional   Drug use: Never   Sexual activity: Not on file  Other Topics Concern   Not on file  Social History Narrative   She has 4 children ages 71 and under.  She stays at home. She does not smoke cigarettes or drink alcohol. Does not get regular exercise.    Social Drivers of Corporate investment banker Strain: High Risk (11/23/2021)   Overall Financial Resource Strain (CARDIA)    Difficulty of Paying Living Expenses: Hard  Food  Insecurity: No Food Insecurity (08/26/2022)   Hunger Vital Sign    Worried About Running Out of Food in the Last Year: Never true    Ran Out of Food in the Last Year: Never true  Transportation Needs: Unmet Transportation Needs (06/08/2022)   PRAPARE - Administrator, Civil Service (Medical): Yes    Lack of Transportation (Non-Medical): Yes  Physical Activity: Inactive (11/23/2021)   Exercise Vital Sign    Days of Exercise per Week: 0 days    Minutes of Exercise per Session: 0 min  Stress: Stress Concern Present (11/23/2021)   [employer] of Occupational Health - Occupational Stress Questionnaire    Feeling of Stress : To some extent  Social Connections: Unknown (03/22/2022)   [facility] from [facility], [facility]   Social Network    Social Network: Not on file  Intimate Partner Violence: Unknown (03/22/2022)   Received from [facility], [facility]   HITS    Physically Hurt: Not on file    Insult or Talk Down To: Not on file    Threaten Physical Harm: Not on file    Scream or Curse: Not on file   FH:  Family History  Problem Relation Age of Onset   Heart disease Mother        No details   Hypertension Mother    Diabetes Mother    Hypertension Father    Heart murmur Sister    Past Medical History:  Diagnosis Date   Cardiomyopathy    CHF (congestive heart failure) (HCC)    HTN (hypertension)    Mitral regurgitation    Current Outpatient Medications  Medication Sig Dispense Refill   acetaminophen  (TYLENOL ) 500 MG tablet Take 500-1,000 mg by mouth 4 (four) times daily as needed (for pain).     carvedilol  (COREG ) 12.5 MG tablet Take 1.5 tablets (18.75 mg total) by mouth 2 (two) times daily with a meal. 45 tablet 6   digoxin  (LANOXIN ) 0.125 MG tablet Take 1 tablet (0.125 mg total) by mouth daily. 30 tablet 11   lisinopril  (ZESTRIL ) 20 MG tablet Take 1 tablet (20 mg total) by mouth daily. 30 tablet 11   methimazole  (TAPAZOLE ) 10 MG tablet Take 20 mg by  mouth daily.     Misc. Devices MISC Blood pressure monitor.  Diagnosis hypertension 1 each 0   potassium chloride  SA (KLOR-CON  M) 20 MEQ tablet Take 1 tablet (20 mEq total) by mouth daily. 90 tablet 3   spironolactone  (ALDACTONE ) 25 MG tablet Take 1 tablet (25 mg total) by mouth daily. OFFICE VISIT NEEDED FOR ADDITIONAL REFILLS (Patient not taking: Reported on 10/25/2023) 30 tablet 11   torsemide  (DEMADEX ) 20 MG tablet Take 2 tablets (40 mg total) by mouth daily. (Patient not taking: Reported on 10/25/2023) 30 tablet 7   No current facility-administered medications for this encounter.  BP 120/87   Pulse 86   Ht 5\' 7"  (1.702 m)   Wt 116 kg (255 lb 12.8 oz)   SpO2 97%   BMI 40.06 kg/m   Wt Readings from Last 3 Encounters:  10/25/23 116 kg (255 lb 12.8 oz)  04/03/23 108.9 kg (240 lb)  12/01/22 110.1 kg (242 lb 12.8 oz)   PHYSICAL EXAM: General:  well appearing.  No respiratory difficulty. Walked into clinic Neck: supple. JVD flat.  Cor: PMI nondisplaced. Regular rate & rhythm. No rubs, gallops. No MR murmur heard Lungs: clear Extremities: no cyanosis, clubbing, rash, edema  Neuro: alert & oriented x 3. Moves all 4 extremities w/o difficulty. Affect pleasant.   ECG (personally reviewed): NST 78 bpm   ReDs reading: 21 %, abnormal   ASSESSMENT & PLAN: Chronic Systolic Heart Failure - Onset in 2010. 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 - Echo (5/19): EF 20-25% with severe posterior MR.   - Echo (7/20): EF 35-40% mod-severe MR with restricted posterior leaflet  - Echo (2/21): EF 30-35% mod-severe MR RV ok  - Echo (10/21): EF 35-40% moderate to severe MR  - Echo (9/22): EF 30-35%, RV mildly reduced, Severe MR, Mild TR  - Echo (5/23): EF 25-30%, severe LV dilation, mildly decreased RV function, severe LAE, moderate-severe MR with thickened mitral valve, moderate TR, IVC dilated.  - cMRI (5/23): LVEF 25% RVEF 30%, No LGE. - Echo (2/24): EF  20-25% RV mildly down mod-severe MR  - NYHA II. Volume looks stable. But ReDs 21% - Liberalize fluids today. Took 40 mg PRN Torsemide  x1 last week, she started to feel the chest discomfort after taking.  - Change torsemide  to 20 mg PRN, take 20 KCL PRN when taking torsemide . - Continue Coreg  18.75 mg bid.  - Continue lisinopril  20 mg daily (no Entresto  due to GI upset).  - Restart spironolactone  12.5 mg 5/2.  - Restart digoxin  0.125 mg daily. Check Dig level today.  - No SGLT2i d/t multiple episodes of pyelonephritis in past. Denies recent GU symptoms.  - She remains adamant that she does not want to consider ICD - Labs today - Has echo scheduled for next week.  - Can consider L/RHC however chest pain resolves with Alleve, EKG reassuring and has has this chest discomfort before.    2. 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   - She was dismissed from [facility] Endocrin[facility].   - Follow with Dr. Marcella Serge, Endocrine follow up at [facility].  TSH 0.906, Free T4 1.09 and Free T3 2.7 on 10/24. - Continue methimazole  20 mg daily  - Off prednisone  due to stomach upset  - Continue beta blocker.    3. Mitral regurgitation  - Moderate-severe on echo 5/23.  With severe LV and LA dilation, suspect component of functional MR but the mitral valve is also thickened with posterior leaflet restricted   - Echo 08/18/22: EF 20-25% RV mildly down mod-severe MR  - She is not inclined to invasive management/evaluation, hopefully MR will improve with medical management. - We discussed referral to Structural Heart team, she will think about this.  4. Palpitations - Previous monitor 3/21 with NSR 90s, Brief NSVT and SVT (longest 16 seconds), rare PACs/PVCs - place 2 week Zio - No ectopy on EKG today  Has echo scheduled 11/03/23. Follow up in 3-4 weeks with Dr. [name].   Sheryl Donna, NP  9:31 AM

## 2023-10-24 ENCOUNTER — Telehealth (HOSPITAL_COMMUNITY): Payer: Self-pay

## 2023-10-24 NOTE — Telephone Encounter (Signed)
 Called to confirm/remind patient of their appointment at the Advanced [facility] Failure Clinic on 10/25/2023 9:00.   Appointment:   [] Confirmed  [x] Left mess   [] No answer/No voice mail  [] VM Full/unable to leave message  [] Phone not in service  Patient reminded to bring all medications and/or complete list.  Confirmed patient has transportation. Gave directions, instructed to utilize valet parking.

## 2023-10-25 ENCOUNTER — Ambulatory Visit (HOSPITAL_COMMUNITY)
Admission: RE | Admit: 2023-10-25 | Discharge: 2023-10-25 | Disposition: A | Discharge status: Home / Self Care | Source: Ambulatory Visit | Attending: Internal Medicine | Admitting: Internal Medicine

## 2023-10-25 ENCOUNTER — Inpatient Hospital Stay (HOSPITAL_COMMUNITY)
Admission: RE | Admit: 2023-10-25 | Discharge: 2023-10-25 | Disposition: A | Discharge status: Home / Self Care | Source: Ambulatory Visit | Attending: Internal Medicine | Admitting: Internal Medicine

## 2023-10-25 ENCOUNTER — Other Ambulatory Visit (HOSPITAL_COMMUNITY): Payer: Self-pay | Admitting: Internal Medicine

## 2023-10-25 ENCOUNTER — Encounter (HOSPITAL_COMMUNITY): Payer: Self-pay

## 2023-10-25 VITALS — BP 120/87 | HR 86 | Ht 67.0 in | Wt 255.8 lb

## 2023-10-25 DIAGNOSIS — I5022 Chronic systolic (congestive) heart failure: Secondary | ICD-10-CM | POA: Diagnosis not present

## 2023-10-25 DIAGNOSIS — I340 Nonrheumatic mitral (valve) insufficiency: Secondary | ICD-10-CM | POA: Diagnosis not present

## 2023-10-25 DIAGNOSIS — R002 Palpitations: Secondary | ICD-10-CM

## 2023-10-25 DIAGNOSIS — I081 Rheumatic disorders of both mitral and tricuspid valves: Secondary | ICD-10-CM | POA: Insufficient documentation

## 2023-10-25 DIAGNOSIS — E0590 Thyrotoxicosis, unspecified without thyrotoxic crisis or storm: Secondary | ICD-10-CM

## 2023-10-25 DIAGNOSIS — Z79899 Other long term (current) drug therapy: Secondary | ICD-10-CM | POA: Insufficient documentation

## 2023-10-25 DIAGNOSIS — I110 Hypertensive heart disease with heart failure: Secondary | ICD-10-CM | POA: Insufficient documentation

## 2023-10-25 DIAGNOSIS — I428 Other cardiomyopathies: Secondary | ICD-10-CM | POA: Insufficient documentation

## 2023-10-25 DIAGNOSIS — Z5986 Financial insecurity: Secondary | ICD-10-CM | POA: Insufficient documentation

## 2023-10-25 DIAGNOSIS — Z5982 Transportation insecurity: Secondary | ICD-10-CM | POA: Insufficient documentation

## 2023-10-25 LAB — BASIC METABOLIC PANEL WITH GFR
Anion gap: 11 (ref 5–15)
BUN: 12 mg/dL (ref 6–20)
CO2: 19 mmol/L — ABNORMAL LOW (ref 22–32)
Calcium: 11.4 mg/dL — ABNORMAL HIGH (ref 8.9–10.3)
Chloride: 107 mmol/L (ref 98–111)
Creatinine, Ser: 0.75 mg/dL (ref 0.44–1.00)
GFR, Estimated: >60 mL/min (ref >60)
Glucose, Bld: 83 mg/dL (ref 70–99)
Potassium: 4.4 mmol/L (ref 3.5–5.1)
Sodium: 137 mmol/L (ref 135–145)

## 2023-10-25 LAB — BRAIN NATRIURETIC PEPTIDE: B Natriuretic Peptide: 74 pg/mL (ref 0.0–100.0)

## 2023-10-25 LAB — MAGNESIUM: Magnesium: 2.3 mg/dL (ref 1.7–2.4)

## 2023-10-25 LAB — DIGOXIN LEVEL: Digoxin Level: <0.4 ng/mL — ABNORMAL LOW (ref 0.8–2.0)

## 2023-10-25 MED ORDER — SPIRONOLACTONE 25 MG PO TABS: 12.5000 mg | ORAL_TABLET | Freq: Every day | ORAL | 3 refills | Status: DC

## 2023-10-25 MED ORDER — TORSEMIDE 20 MG PO TABS: 20.0000 mg | ORAL_TABLET | ORAL | 3 refills | Status: DC | PRN

## 2023-10-25 MED ORDER — DIGOXIN 125 MCG PO TABS
0.1250 mg | ORAL_TABLET | Freq: Every day | ORAL | 3 refills | Status: DC
Start: 2023-10-25 — End: 2023-11-03

## 2023-10-25 NOTE — Addendum Note (Signed)
 Encounter addended by: Edman Gory, RN on: 10/25/2023 12:31 PM  Actions taken: Charge Capture section accepted

## 2023-10-25 NOTE — Patient Instructions (Addendum)
 Re-start Spiro 12.5 mg daily (1/2 tab daily) - updated Rx sent. Re-start Digoxin  0.125 mcg daily - Rx sent. Only take torsemide  as needed for weight gain > 3 lbs overnight or > 5 lbs in one week, increased swelling, or increased shortness of breath.  Labs today - will call you if abnormal. Zio (heart monitor) placed today - see below. Return to see Dr. Julane Ny in 4 weeks - see below. Please call us  at [phone] if any questions or concerns prior to your next appointment.    Your provider has recommended that  you wear a Zio Patch for 14 days.  This monitor will record your heart rhythm for our review.  IF you have any symptoms while wearing the monitor please press the button.  If you have any issues with the patch or you notice a red or orange light on it please call the company at [phone].  Once you remove the patch please mail it back to the company as soon as possible so we can get the results.  Zio patch placed onto patient.  All instructions and information reviewed with patient, they verbalize understanding with no questions.

## 2023-10-25 NOTE — Progress Notes (Signed)
 ReDS Vest / Clip - 10/25/23 0900       ReDS Vest / Clip   Station Marker D    Ruler Value 34    ReDS Value Range Low volume    ReDS Actual Value 21

## 2023-11-03 ENCOUNTER — Ambulatory Visit (HOSPITAL_COMMUNITY)
Admission: RE | Admit: 2023-11-03 | Discharge: 2023-11-03 | Disposition: A | Discharge status: Home / Self Care | Source: Ambulatory Visit | Attending: Family Medicine | Admitting: Family Medicine

## 2023-11-03 ENCOUNTER — Encounter (HOSPITAL_COMMUNITY): Payer: Self-pay

## 2023-11-03 ENCOUNTER — Ambulatory Visit (HOSPITAL_BASED_OUTPATIENT_CLINIC_OR_DEPARTMENT_OTHER)
Admission: RE | Admit: 2023-11-03 | Discharge: 2023-11-03 | Disposition: A | Discharge status: Home / Self Care | Source: Ambulatory Visit | Attending: Family Medicine | Admitting: Family Medicine

## 2023-11-03 ENCOUNTER — Telehealth (HOSPITAL_COMMUNITY): Payer: Self-pay

## 2023-11-03 VITALS — BP 136/84 | HR 85 | Ht 67.0 in | Wt 262.8 lb

## 2023-11-03 DIAGNOSIS — R0789 Other chest pain: Secondary | ICD-10-CM | POA: Insufficient documentation

## 2023-11-03 DIAGNOSIS — I340 Nonrheumatic mitral (valve) insufficiency: Secondary | ICD-10-CM

## 2023-11-03 DIAGNOSIS — R0602 Shortness of breath: Secondary | ICD-10-CM | POA: Insufficient documentation

## 2023-11-03 DIAGNOSIS — I110 Hypertensive heart disease with heart failure: Secondary | ICD-10-CM | POA: Insufficient documentation

## 2023-11-03 DIAGNOSIS — E0590 Thyrotoxicosis, unspecified without thyrotoxic crisis or storm: Secondary | ICD-10-CM

## 2023-11-03 DIAGNOSIS — E860 Dehydration: Secondary | ICD-10-CM | POA: Diagnosis not present

## 2023-11-03 DIAGNOSIS — R55 Syncope and collapse: Secondary | ICD-10-CM | POA: Diagnosis not present

## 2023-11-03 DIAGNOSIS — R5383 Other fatigue: Secondary | ICD-10-CM | POA: Insufficient documentation

## 2023-11-03 DIAGNOSIS — I5022 Chronic systolic (congestive) heart failure: Secondary | ICD-10-CM | POA: Diagnosis not present

## 2023-11-03 DIAGNOSIS — R002 Palpitations: Secondary | ICD-10-CM | POA: Diagnosis not present

## 2023-11-03 DIAGNOSIS — E669 Obesity, unspecified: Secondary | ICD-10-CM | POA: Diagnosis not present

## 2023-11-03 DIAGNOSIS — Z79899 Other long term (current) drug therapy: Secondary | ICD-10-CM | POA: Diagnosis not present

## 2023-11-03 LAB — ECHOCARDIOGRAM COMPLETE
AR max vel: 1.65 cm2
AV Area VTI: 1.77 cm2
AV Area mean vel: 1.95 cm2
AV Mean grad: 3 mmHg
AV Peak grad: 6.5 mmHg
Ao pk vel: 1.27 m/s
Area-P 1/2: 4.83 cm2
Calc EF: 32.7 %
Est EF: 30
MV M vel: 5 m/s
MV Peak grad: 99.9 mmHg
MV VTI: 1.29 cm2
Radius: 0.6 cm
S' Lateral: 5.7 cm
Single Plane A2C EF: 33.3 %
Single Plane A4C EF: 37.2 %

## 2023-11-03 LAB — CBC
HCT: 44.7 % (ref 36.0–46.0)
Hemoglobin: 14.2 g/dL (ref 12.0–15.0)
MCH: 26.8 pg (ref 26.0–34.0)
MCHC: 31.8 g/dL (ref 30.0–36.0)
MCV: 84.5 fL (ref 80.0–100.0)
Platelets: 191 10*3/uL (ref 150–400)
RBC: 5.29 MIL/uL — ABNORMAL HIGH (ref 3.87–5.11)
RDW: 13 % (ref 11.5–15.5)
WBC: 5.8 10*3/uL (ref 4.0–10.5)
nRBC: 0 % (ref 0.0–0.2)

## 2023-11-03 LAB — BASIC METABOLIC PANEL WITH GFR
Anion gap: 9 (ref 5–15)
BUN: 9 mg/dL (ref 6–20)
CO2: 18 mmol/L — ABNORMAL LOW (ref 22–32)
Calcium: 8.9 mg/dL (ref 8.9–10.3)
Chloride: 109 mmol/L (ref 98–111)
Creatinine, Ser: 0.67 mg/dL (ref 0.44–1.00)
GFR, Estimated: >60 mL/min (ref >60)
Glucose, Bld: 124 mg/dL — ABNORMAL HIGH (ref 70–99)
Potassium: 4 mmol/L (ref 3.5–5.1)
Sodium: 136 mmol/L (ref 135–145)

## 2023-11-03 LAB — IRON AND TIBC
Iron: 119 ug/dL (ref 28–170)
Saturation Ratios: 31 % (ref 10.4–31.8)
TIBC: 379 ug/dL (ref 250–450)
UIBC: 260 ug/dL

## 2023-11-03 LAB — BRAIN NATRIURETIC PEPTIDE: B Natriuretic Peptide: 369.6 pg/mL — ABNORMAL HIGH (ref 0.0–100.0)

## 2023-11-03 LAB — FERRITIN: Ferritin: 177 ng/mL (ref 11–307)

## 2023-11-03 LAB — TSH: TSH: 0.57 u[IU]/mL (ref 0.350–4.500)

## 2023-11-03 LAB — T4, FREE: Free T4: 0.97 ng/dL (ref 0.61–1.12)

## 2023-11-03 MED ORDER — SPIRONOLACTONE 25 MG PO TABS: 12.5000 mg | ORAL_TABLET | Freq: Every day | ORAL | 3 refills | Status: DC

## 2023-11-03 MED ORDER — TORSEMIDE 20 MG PO TABS: 20.0000 mg | ORAL_TABLET | ORAL | 3 refills | Status: DC | PRN

## 2023-11-03 MED ORDER — POTASSIUM CHLORIDE CRYS ER 20 MEQ PO TBCR
20.0000 meq | EXTENDED_RELEASE_TABLET | Freq: Every day | ORAL | 3 refills | Status: DC
Start: 2023-11-03 — End: 2023-11-24

## 2023-11-03 MED ORDER — TORSEMIDE 20 MG PO TABS
20.0000 mg | ORAL_TABLET | ORAL | 3 refills | Status: DC
Start: 2023-11-06 — End: 2023-11-24

## 2023-11-03 MED ORDER — CARVEDILOL 25 MG PO TABS: 25.0000 mg | ORAL_TABLET | Freq: Two times a day (BID) | ORAL | 3 refills | Status: AC

## 2023-11-03 MED ORDER — DIGOXIN 125 MCG PO TABS: 0.1250 mg | ORAL_TABLET | Freq: Every day | ORAL | 3 refills | Status: AC

## 2023-11-03 NOTE — Patient Instructions (Signed)
 INCREASE Carvedilol  yo 25 mg Twice daily  Labs done today, your results will be available in MyChart, we will contact you for abnormal readings.  Keep follow up as scheduled with Dr. [name].  If you have any questions or concerns before your next appointment please send us  a message through [place] or call our office at [phone].    TO LEAVE A MESSAGE FOR THE NURSE SELECT OPTION 2, PLEASE LEAVE A MESSAGE INCLUDING: YOUR NAME DATE OF BIRTH CALL BACK NUMBER REASON FOR CALL**this is important as we prioritize the call backs  YOU WILL RECEIVE A CALL BACK THE SAME DAY AS LONG AS YOU CALL BEFORE 4:00 PM  At the [facility], you and your health needs are our priority. As part of our continuing mission to provide you with exceptional heart care, we have created designated Provider Care Teams. These Care Teams include your primary Cardiologist (physician) and Advanced Practice Providers (APPs- Physician Assistants and Nurse Practitioners) who all work together to provide you with the care you need, when you need it.   You may see any of the following providers on your designated Care Team at your next follow up: Dr Jules Oar Dr Peder Bourdon Dr. Alwin Baars Dr. Arta Lark Amy Marijane Shoulders, NP Ruddy Corral, [place] [facility] [place], [place] Dennise Fitz, NP [place] Lee, NP Shawnee Dellen, NP Luster Salters, PharmD Bevely Brush, PharmD   Please be sure to bring in all your medications bottles to every appointment.    Thank you for choosing [facility]

## 2023-11-03 NOTE — Telephone Encounter (Addendum)
 Pt aware, agreeable, and verbalized understanding  Med list updated. Per Camilo Cella labs can get repeated at follow up on 11/24/23  ----- Message from Elmarie Hacking sent at 11/03/2023 12:03 PM EDT ----- Labs ok but BNP elevated, suggesting she has volume on board.  Take torsemide  20 mg/20 KCL on Mondays and Fridays.  BMET in 2 weeks

## 2023-11-03 NOTE — Progress Notes (Signed)
  Echocardiogram 2D Echocardiogram has been performed.  Autry Prust L Marymargaret Kirker RDCS 11/03/2023, 8:50 AM

## 2023-11-03 NOTE — Progress Notes (Signed)
 [facility] Note  PCP: Joaquin Mulberry, MD Endocrinology: Dr Hubert Madden Primary Cardiologist: Dr Lavonne Prairie  HF Cardiologist: Dr Julane Ny   HPI: Sarah Reilly is a 43 y.o. 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    She developed HF after the birth of her 4th child in 2010. EF 20%. Did not have a heart cath.  She then moved back to [place]  to be near her mother. Apparently EF dropped to 15-20% for a period. They suggested ICD but she refused. She returned to [place].  In July 2019 she was diagnosed with hyperthyroidism. Started on methimazole  by Dr Hubert Madden.   Previously she was having CP in setting of 30 pound weight gain. Dr. Julane Ny suggested R/L cath but she refused saying she wanted to try and lose weight first and see how she felt. We eventually rescheduled but she couldn't get COVID test.   She was seen in [facility] 8/21 and was quite tachycardic. She had not seen Dr. Hubert Madden since 12/20. Ran out of methimazole  refils. 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  TSH < 0.010. She has not followed up with Endo. On the chart there is a dismissal letter from Dr. Hubert Madden dated 02/27/20.  Echo 04/02/20 EF 35-40%, RV normal. Mod-severe MR w/ tethering of posterior leaflet.   Echo 9/22 EF 30-35% RV mildly. Bi atrial enlargement.    Saw Dr Lavonne Prairie 11/05/21 with increased shortness of breath, orthopnea, and PND. Admitted with A/C HFrEF. TSH 0.005. Echo EF 25-30%. cMRI showed LVEF 25%, RV reduced, no LGE. No clear etiology. GDMT titrated. No Entresto  d/t intolerance, started on ACE. Discharged home, weight 210 lbs.  Echo 08/18/22: EF 20-25% RV mildly down mod-severe MR  Follow up 4/25, volume low had CP, felt to be MSK. Torsemide  changed to PRN and echo arranged. Zio placed with frequent  palps.  Today she returns for HF follow up. Overall feeling fair. Palpitations are bothersome. Having CP x 1 month and started when she was regularly taking torsemide . Pain located in left upper chest goes straight thru to back. She attributes this to dehydration and MSK strain. Pain improved when she takes Tylenol  and ASA.  Remains dizzy. Denies abnormal bleeding, edema, or PND/Orthopnea. Appetite ok, trying to push fluids. Weight at home 252 pounds. Taking all medications.   Echo today 11/03/23, EF appears similar to previous ~ 30%. Awaiting official MD interpretation.  Cardiac Studies: - Echo (2/24): EF 20-25% RV mildly down mod-severe MR  - cMRI (5/23): LVEF 25%, RVEF 30%, no LGE, moderate MR and TR.  - Echo (5/23): EF 25-30%, severe LV dysfunction with global HK, RV mildly down, mild to moderate MR, moderate TR  - Echo (03/09/21): EF 30-35%, RV mildly reduced, severe MR, mild-mod TR   - Echo (04/02/20): EF 35-40%   - Echo (2/21): LV dilated/spherical 30-35% likely severe MR. RV ok   - Echo (12/2018): EF 35-40% Mod-Severe MR, RV normal  - Echo (11/16/2017): EF 20-25% Severe MR   ROS: All systems negative except as listed in HPI, PMH and Problem List.  SH:  Social History   Socioeconomic History   Marital status: Single    Spouse name: Not on file   Number of children: Not on file   Years of education: Not on file   Highest education level: Not on  file  Occupational History   Not on file  Tobacco Use   Smoking status: Former   Smokeless tobacco: Never  Substance and Sexual Activity   Alcohol use: Yes    Alcohol/week: 4.0 standard drinks of alcohol    Types: 3 Glasses of wine, 1 Standard drinks or equivalent per week    Comment: Occassional   Drug use: Never   Sexual activity: Not on file  Other Topics Concern   Not on file  Social History Narrative   She has 4 children ages 36 and under.  She stays at home. She does not smoke cigarettes or drink alcohol. Does not get  regular exercise.    Social Drivers of Corporate investment banker Strain: High Risk (11/23/2021)   Overall Financial Resource Strain (CARDIA)    Difficulty of Paying Living Expenses: Hard  Food Insecurity: No Food Insecurity (08/26/2022)   Hunger Vital Sign    Worried About Running Out of Food in the Last Year: Never true    Ran Out of Food in the Last Year: Never true  Transportation Needs: Unmet Transportation Needs (06/08/2022)   PRAPARE - Administrator, Civil Service (Medical): Yes    Lack of Transportation (Non-Medical): Yes  Physical Activity: Inactive (11/23/2021)   Exercise Vital Sign    Days of Exercise per Week: 0 days    Minutes of Exercise per Session: 0 min  Stress: Stress Concern Present (11/23/2021)   [employer] of Occupational Health - Occupational Stress Questionnaire    Feeling of Stress : To some extent  Social Connections: Unknown (03/22/2022)   Received from [facility]   Social Network    Social Network: Not on file  Intimate Partner Violence: Unknown (03/22/2022)   Received from [facility], [facility]   HITS    Physically Hurt: Not on file    Insult or Talk Down To: Not on file    Threaten Physical Harm: Not on file    Scream or Curse: Not on file   FH:  Family History  Problem Relation Age of Onset   Heart disease Mother        No details   Hypertension Mother    Diabetes Mother    Hypertension Father    Heart murmur Sister    Past Medical History:  Diagnosis Date   Cardiomyopathy    CHF (congestive heart failure) (HCC)    HTN (hypertension)    Mitral regurgitation    Current Outpatient Medications  Medication Sig Dispense Refill   acetaminophen  (TYLENOL ) 500 MG tablet Take 500-1,000 mg by mouth 4 (four) times daily as needed (for pain).     carvedilol  (COREG ) 12.5 MG tablet Take 1.5 tablets (18.75 mg total) by mouth 2 (two) times daily with a meal. 45 tablet 6   digoxin  (LANOXIN ) 0.125 MG tablet Take 1  tablet (0.125 mg total) by mouth daily. 90 tablet 3   lisinopril  (ZESTRIL ) 20 MG tablet Take 1 tablet (20 mg total) by mouth daily. 30 tablet 11   methimazole  (TAPAZOLE ) 10 MG tablet Take 20 mg by mouth daily.     Misc. Devices MISC Blood pressure monitor.  Diagnosis hypertension 1 each 0   potassium chloride  SA (KLOR-CON  M) 20 MEQ tablet Take 1 tablet (20 mEq total) by mouth daily. 90 tablet 3   spironolactone  (ALDACTONE ) 25 MG tablet Take 0.5 tablets (12.5 mg total) by mouth daily. 45 tablet 3   torsemide  (DEMADEX ) 20 MG tablet  Take 1 tablet (20 mg total) by mouth as needed. For weight gain > 3 lbs overnight or > 5 lbs in one week, increased swelling, or increased shortness of breath. 30 tablet 3   No current facility-administered medications for this encounter.   BP 136/84   Pulse 85   Ht 5\' 7"  (1.702 m)   Wt 119.2 kg (262 lb 12.8 oz)   SpO2 97%   BMI 41.16 kg/m   Wt Readings from Last 3 Encounters:  11/03/23 119.2 kg (262 lb 12.8 oz)  10/25/23 116 kg (255 lb 12.8 oz)  04/03/23 108.9 kg (240 lb)   PHYSICAL EXAM: General:  NAD. No resp difficulty, walked into clinic HEENT: Normal Neck: Supple. No JVD. Thick neck Cor: Regular rate & rhythm. No rubs, gallops, 2/6 MR; Left upper chest TTP Lungs: Clear Abdomen: Soft, obese, nontender, nondistended.  Extremities: No cyanosis, clubbing, rash, edema Neuro: Alert & oriented x 3, moves all 4 extremities w/o difficulty. Affect pleasant.  ReDs reading: 22%, abnormal   ASSESSMENT & PLAN: Chronic Systolic Heart Failure - Onset in 2010. 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 - Echo (5/19): EF 20-25% with severe posterior MR.   - Echo (7/20): EF 35-40% mod-severe MR with restricted posterior leaflet  - Echo (2/21): EF 30-35% mod-severe MR RV ok  - Echo (10/21): EF 35-40% moderate to severe MR  - Echo (9/22): EF 30-35%, RV mildly reduced, Severe MR, Mild TR  - Echo (5/23): EF 25-30%, severe LV  dilation, mildly decreased RV function, severe LAE, moderate-severe MR with thickened mitral valve, moderate TR, IVC dilated.  - cMRI (5/23): LVEF 25% RVEF 30%, No LGE. - Echo (2/24): EF 20-25% RV mildly down mod-severe MR  - Echo today 11/03/23 EF ~30% on my read, awaiting official MD interpretation. - NYHA II. Volume looks stable but difficult to assess due to body habitus. ReDs 22% - Increase Coreg  to 25 mg bid. - Continue torsemide  20 mg PRN/20 KCL  - Continue lisinopril  20 mg daily (no Entresto  due to GI upset).  - Continue spironolactone  12.5 mg daily. - Continue digoxin  0.125 mg daily.   - No SGLT2i d/t multiple episodes of pyelonephritis in past.    - She remains decided against ICD. - Labs today   2. 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   - She was dismissed from [facility].   - Follow with Dr. Marcella Serge, Endocrine follow up at [facility].  TSH 0.906, Free T4 1.09 and Free T3 2.7 on 10/24. - Continue methimazole  20 mg daily  - Off prednisone  due to stomach upset  - Continue beta blocker.    3. Mitral regurgitation  - Moderate-severe on echo 5/23.  With severe LV and LA dilation, suspect component of functional MR but the mitral valve is also thickened with posterior leaflet restricted   - Echo 08/18/22: EF 20-25% RV mildly down mod-severe MR  - She is not inclined to invasive management/evaluation, hopefully MR will improve with medical management. - Previously discussed referral to Structural Heart team, she will think about this. - Await official results on echo today.  4. Palpitations - Previous monitor 3/21 with NSR 90s, Brief NSVT and SVT (longest 16 seconds), rare PACs/PVCs - Currently wearing Zio, await results. - Increase Coreg  as above - labs today.  5. Atypical Chest pain - Present x 1 month, resolves with ASA and Tylenol  - Left upper chest  today TTP - Likely MSK vs volume  related.  Keep follow up with Dr Julane Ny, as scheduled.  Elmarie Hacking, FNP  8:52 AM

## 2023-11-04 LAB — T3, FREE: T3, Free: 2.7 pg/mL (ref 2.0–4.4)

## 2023-11-21 ENCOUNTER — Other Ambulatory Visit (HOSPITAL_COMMUNITY): Payer: Self-pay | Admitting: Physician Assistant

## 2023-11-21 DIAGNOSIS — I5022 Chronic systolic (congestive) heart failure: Secondary | ICD-10-CM

## 2023-11-23 ENCOUNTER — Telehealth (HOSPITAL_COMMUNITY): Payer: Self-pay | Admitting: Internal Medicine

## 2023-11-23 NOTE — Telephone Encounter (Signed)
 Called to confirm/remind patient of their appointment at the [facility] on 11/23/2023.   Appointment:   [] Confirmed  [x] Left mess   [] No answer/No voice mail  [] VM Full/unable to leave message  [] Phone not in service  Patient reminded to bring all medications and/or complete list.  Confirmed patient has transportation. Gave directions, instructed to utilize valet parking.

## 2023-11-24 ENCOUNTER — Ambulatory Visit (HOSPITAL_COMMUNITY)
Admission: RE | Admit: 2023-11-24 | Discharge: 2023-11-24 | Disposition: A | Discharge status: Home / Self Care | Source: Ambulatory Visit | Attending: Internal Medicine | Admitting: Internal Medicine

## 2023-11-24 ENCOUNTER — Encounter (HOSPITAL_COMMUNITY): Payer: Self-pay | Admitting: Internal Medicine

## 2023-11-24 VITALS — BP 110/70 | HR 80 | Wt 256.8 lb

## 2023-11-24 DIAGNOSIS — R9431 Abnormal electrocardiogram [ECG] [EKG]: Secondary | ICD-10-CM | POA: Diagnosis not present

## 2023-11-24 DIAGNOSIS — I340 Nonrheumatic mitral (valve) insufficiency: Secondary | ICD-10-CM | POA: Diagnosis not present

## 2023-11-24 DIAGNOSIS — F4000 Agoraphobia, unspecified: Secondary | ICD-10-CM | POA: Diagnosis not present

## 2023-11-24 DIAGNOSIS — Z79890 Hormone replacement therapy (postmenopausal): Secondary | ICD-10-CM | POA: Diagnosis not present

## 2023-11-24 DIAGNOSIS — Z6841 Body Mass Index (BMI) 40.0 and over, adult: Secondary | ICD-10-CM | POA: Diagnosis not present

## 2023-11-24 DIAGNOSIS — Z87448 Personal history of other diseases of urinary system: Secondary | ICD-10-CM | POA: Diagnosis not present

## 2023-11-24 DIAGNOSIS — E669 Obesity, unspecified: Secondary | ICD-10-CM | POA: Diagnosis not present

## 2023-11-24 DIAGNOSIS — Z79899 Other long term (current) drug therapy: Secondary | ICD-10-CM | POA: Diagnosis not present

## 2023-11-24 DIAGNOSIS — E0590 Thyrotoxicosis, unspecified without thyrotoxic crisis or storm: Secondary | ICD-10-CM | POA: Insufficient documentation

## 2023-11-24 DIAGNOSIS — I110 Hypertensive heart disease with heart failure: Secondary | ICD-10-CM | POA: Diagnosis not present

## 2023-11-24 DIAGNOSIS — I428 Other cardiomyopathies: Secondary | ICD-10-CM | POA: Insufficient documentation

## 2023-11-24 DIAGNOSIS — R002 Palpitations: Secondary | ICD-10-CM | POA: Diagnosis not present

## 2023-11-24 DIAGNOSIS — I5022 Chronic systolic (congestive) heart failure: Secondary | ICD-10-CM | POA: Insufficient documentation

## 2023-11-24 LAB — BRAIN NATRIURETIC PEPTIDE: B Natriuretic Peptide: 83.8 pg/mL (ref 0.0–100.0)

## 2023-11-24 LAB — COMPREHENSIVE METABOLIC PANEL WITH GFR
ALT: 19 U/L (ref 0–44)
AST: 21 U/L (ref 15–41)
Albumin: 3.6 g/dL (ref 3.5–5.0)
Alkaline Phosphatase: 47 U/L (ref 38–126)
Anion gap: 10 (ref 5–15)
BUN: 10 mg/dL (ref 6–20)
CO2: 21 mmol/L — ABNORMAL LOW (ref 22–32)
Calcium: 9.3 mg/dL (ref 8.9–10.3)
Chloride: 106 mmol/L (ref 98–111)
Creatinine, Ser: 0.74 mg/dL (ref 0.44–1.00)
GFR, Estimated: >60 mL/min (ref >60)
Glucose, Bld: 93 mg/dL (ref 70–99)
Potassium: 4 mmol/L (ref 3.5–5.1)
Sodium: 137 mmol/L (ref 135–145)
Total Bilirubin: 0.9 mg/dL (ref 0.0–1.2)
Total Protein: 7.3 g/dL (ref 6.5–8.1)

## 2023-11-24 LAB — DIGOXIN LEVEL: Digoxin Level: 0.6 ng/mL — ABNORMAL LOW (ref 0.8–2.0)

## 2023-11-24 LAB — TSH: TSH: 1.166 u[IU]/mL (ref 0.350–4.500)

## 2023-11-24 LAB — T4, FREE: Free T4: 0.84 ng/dL (ref 0.61–1.12)

## 2023-11-24 NOTE — Progress Notes (Signed)
 [facility] Note  PCP: Joaquin Mulberry, MD Endocrinology: Dr Hubert Madden Primary Cardiologist: Dr Lavonne Prairie  HF Cardiologist: Dr Julane Ny   HPI: Sarah Reilly is a 42 y.o. 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    She developed HF after the birth of her 4th child in 2010. EF 20%. Did not have a heart cath.  She then moved back to [place]  to be near her mother. Apparently EF dropped to 15-20% for a period. They suggested ICD but she refused. She returned to [place].  In July 2019 she was diagnosed with hyperthyroidism. Started on methimazole  by Dr Hubert Madden.   Previously she was having CP in setting of 30 pound weight gain. Dr. Julane Ny suggested R/L cath but she refused saying she wanted to try and lose weight first and see how she felt. We eventually rescheduled but she couldn't get COVID test.   She was seen in [facility] 8/21 and was quite tachycardic. She had not seen Dr. Hubert Madden since 12/20. Ran out of methimazole  refils. 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  TSH < 0.010. She has not followed up with Endo. On the chart there is a dismissal letter from Dr. Hubert Madden dated 02/27/20.  Echo 04/02/20 EF 35-40%, RV normal. Mod-severe MR w/ tethering of posterior leaflet.    Saw Dr Lavonne Prairie 11/05/21 with increased shortness of breath, orthopnea, and PND. Admitted with A/C HFrEF. TSH 0.005. Echo EF 25-30%. cMRI showed LVEF 25%, RV reduced, no LGE. No clear etiology. GDMT titrated. No Entresto  d/t intolerance, started on ACE. Discharged home, weight 210 lbs.  Echo 08/18/22: EF 20-25% RV mildly down mod-severe MR  Follow up 4/25, volume low had CP, felt to be MSK. Torsemide  changed to PRN and echo arranged. Zio placed with frequent palps.   Echo 11/03/23, EF ~ 30%. Mod-sev MR  Zio 5/25 1.  Sinus rhythm - avg HR of 75 bpm.  2. One run of nonsustained Ventricular Tachycardia occurred lasting 20 beats with a max rate of 146 bpm (avg 129 bpm). 3. PVCs were occasional (1.7%, 84696) 4. Ventricular Bigeminy and Trigeminy were present.  5. 75 patient-triggered events associated with sinus rhythm  Here for f/u. Says she is doing OK. Still with palpitations. Remains on methimazole  and b-blocker. Not active due to body aches. Still feels dizzy can happen anytime when lying down, lying or standing. No edema, orthopnea or PND.   Cardiac Studies: - Echo (2/24): EF 20-25% RV mildly down mod-severe MR - cMRI (5/23): LVEF 25%, RVEF 30%, no LGE, moderate MR and TR. - Echo (5/23): EF 25-30%,  RV mildly down, mild to moderate MR, moderate TR - Echo (04/02/20): EF 35-40%  - Echo (11/16/2017): EF 20-25% Severe MR   ROS: All systems negative except as listed in HPI, PMH and Problem List.  SH:  Social History   Socioeconomic History   Marital status: Single    Spouse name: Not on file   Number of children: Not on file   Years of education: Not on file   Highest education level: Not on file  Occupational History   Not on file  Tobacco Use   Smoking status: Former   Smokeless tobacco: Never  Substance and Sexual Activity   Alcohol use: Yes    Alcohol/week: 4.0 standard drinks of alcohol  Types: 3 Glasses of wine, 1 Standard drinks or equivalent per week    Comment: Occassional   Drug use: Never   Sexual activity: Not on file  Other Topics Concern   Not on file  Social History Narrative   She has 4 children ages 24 and under.  She stays at home. She does not smoke cigarettes or drink alcohol. Does not get regular exercise.    Social Drivers of Corporate investment banker Strain: High Risk (11/23/2021)   Overall Financial Resource Strain (CARDIA)    Difficulty of Paying Living Expenses: Hard  Food Insecurity: No Food Insecurity (08/26/2022)   Hunger Vital Sign    Worried About  Running Out of Food in the Last Year: Never true    Ran Out of Food in the Last Year: Never true  Transportation Needs: Unmet Transportation Needs (06/08/2022)   PRAPARE - Administrator, Civil Service (Medical): Yes    Lack of Transportation (Non-Medical): Yes  Physical Activity: Inactive (11/23/2021)   Exercise Vital Sign    Days of Exercise per Week: 0 days    Minutes of Exercise per Session: 0 min  Stress: Stress Concern Present (11/23/2021)   [employer] of Occupational Health - Occupational Stress Questionnaire    Feeling of Stress : To some extent  Social Connections: Unknown (03/22/2022)   Received from [facility]   Social Network    Social Network: Not on file  Intimate Partner Violence: Unknown (03/22/2022)   Received from [facility], [facility]   HITS    Physically Hurt: Not on file    Insult or Talk Down To: Not on file    Threaten Physical Harm: Not on file    Scream or Curse: Not on file   FH:  Family History  Problem Relation Age of Onset   Heart disease Mother        No details   Hypertension Mother    Diabetes Mother    Hypertension Father    Heart murmur Sister    Past Medical History:  Diagnosis Date   Cardiomyopathy    CHF (congestive heart failure) (HCC)    HTN (hypertension)    Mitral regurgitation    Current Outpatient Medications  Medication Sig Dispense Refill   acetaminophen  (TYLENOL ) 500 MG tablet Take 500-1,000 mg by mouth 4 (four) times daily as needed (for pain).     carvedilol  (COREG ) 25 MG tablet Take 1 tablet (25 mg total) by mouth 2 (two) times daily with a meal. 180 tablet 3   digoxin  (LANOXIN ) 0.125 MG tablet Take 1 tablet (0.125 mg total) by mouth daily. 90 tablet 3   lisinopril  (ZESTRIL ) 20 MG tablet TAKE 1 TABLET (20 MG TOTAL) BY MOUTH DAILY. 30 tablet 11   methimazole  (TAPAZOLE ) 10 MG tablet Take 20 mg by mouth daily.     Misc. Devices MISC Blood pressure monitor.  Diagnosis hypertension 1  each 0   potassium chloride  SA (KLOR-CON  M) 20 MEQ tablet Take 20 mEq by mouth 2 (two) times a week.     spironolactone  (ALDACTONE ) 25 MG tablet Take 12.5 mg by mouth 2 (two) times a week.     torsemide  (DEMADEX ) 20 MG tablet Take 20 mg by mouth as needed.     No current facility-administered medications for this encounter.   BP 110/70   Pulse 80   Wt 116.5 kg (256 lb 12.8 oz)   SpO2 98%   BMI 40.22 kg/m  Wt Readings from Last 3 Encounters:  11/24/23 116.5 kg (256 lb 12.8 oz)  11/03/23 119.2 kg (262 lb 12.8 oz)  10/25/23 116 kg (255 lb 12.8 oz)   PHYSICAL EXAM: General:  Well appearing. No resp difficulty HEENT: normal Neck: supple. no JVD. Carotids 2+ bilat; no bruits. No lymphadenopathy or thryomegaly appreciated. Cor: PMI nondisplaced. Regular rate & rhythm. No rubs, gallops or murmurs. Lungs: clear Abdomen: obese soft, nontender, nondistended. No hepatosplenomegaly. No bruits or masses. Good bowel sounds. Extremities: no cyanosis, clubbing, rash, edema Neuro: alert & orientedx3, cranial nerves grossly intact. moves all 4 extremities w/o difficulty. Affect pleasant   ECG: NSR 81 biatrial enlargement LVH with repol Personally reviewed    ASSESSMENT & PLAN:  Chronic Systolic Heart Failure - Onset in 2010. 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 - Echo (5/19): EF 20-25% with severe posterior MR.   - Echo (7/20): EF 35-40% mod-severe MR with restricted posterior leaflet  - cMRI (5/23): LVEF 25% RVEF 30%, No LGE. - Echo (2/24): EF 20-25% RV mildly down mod-severe MR  - Echo 11/03/23 EF ~30% mod-sev MR - NYHA II Volume ok  - Continue carvedilol  25 mg bid. - Continue torsemide  20 mg PRN/20 KCL  - Continue lisinopril  20 mg daily (no Entresto  due to GI upset).  - Continue spironolactone  12.5 mg daily. - Continue digoxin  0.125 mg daily.   - No SGLT2i d/t multiple episodes of pyelonephritis in past.    - Not interested in ICD   2.  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   - She was dismissed from [facility].   - Follow with Dr. Marcella Serge, Endocrine follow up at [facility].   - Continue methimazole  20 mg daily  - Off prednisone  due to stomach upset  - Continue beta blocker.  - Check thyroid  labs today - Ok from a cardiac standpoint for thyroid  surgery if needed    3. Mitral regurgitation  - Moderate-severe on echo 5/23.  With severe LV and LA dilation, suspect component of functional MR but the mitral valve is also thickened with posterior leaflet restricted   - Echo 11/03/23, EF ~ 30%. Mod-sev MR - MR not improving with medical therapy - She is not inclined to invasive management/evaluation - We again discussed referral to Structural Heart team, she will think about this.  4. Palpitations - Previous monitor 3/21 with NSR 90s, Brief NSVT and SVT (longest 16 seconds), rare PACs/PVCs - Zio as above. Symptoms not associated with abnormal rhythms on zio    Han Lysne, MD  11:43 AM

## 2023-11-24 NOTE — Addendum Note (Signed)
 Encounter addended by: Marquis Sitter, [place] on: 11/24/2023 12:31 PM  Actions taken: Flowsheet accepted, Clinical Note Signed, Order list changed, Diagnosis association updated, Charge Capture section accepted

## 2023-11-24 NOTE — Progress Notes (Signed)
 ReDS Vest / Clip - 11/24/23 1200       ReDS Vest / Clip   Station Marker D    Ruler Value 32    ReDS Value Range Low volume    ReDS Actual Value 24

## 2023-11-24 NOTE — Addendum Note (Signed)
 Encounter addended by: Glorietta Lark, RN on: 11/24/2023 12:33 PM  Actions taken: Order list changed, Diagnosis association updated, Clinical Note Signed, Charge Capture section accepted

## 2023-11-24 NOTE — Patient Instructions (Signed)
 Great to see you today!!!  Medication Changes:  None, continue current medications  Lab Work:  Labs done today, your results will be available in MyChart, we will contact you for abnormal readings.   Special Instructions // Education:  Do the following things EVERYDAY: Weigh yourself in the morning before breakfast. Write it down and keep it in a log. Take your medicines as prescribed Eat low salt foods--Limit salt (sodium) to 2000 mg per day.  Stay as active as you can everyday Limit all fluids for the day to less than 2 liters   Follow-Up in: 6 months   At the [facility], you and your health needs are our priority. We have a designated team specialized in the treatment of Heart Failure. This Care Team includes your primary Heart Failure Specialized Cardiologist (physician), Advanced Practice Providers (APPs- Physician Assistants and Nurse Practitioners), and Pharmacist who all work together to provide you with the care you need, when you need it.   You may see any of the following providers on your designated Care Team at your next follow up:  Dr. Jules Oar Dr. Peder Bourdon Dr. Alwin Baars Dr. Judyth Nunnery Nieves Bars, NP Ruddy Corral, [place] [facility], [place] Dennise Fitz, NP [place] Lee, NP Luster Salters, PharmD   Please be sure to bring in all your medications bottles to every appointment.   Need to Contact Us :  If you have any questions or concerns before your next appointment please send us  a message through [facility] or call our office at [phone].    TO LEAVE A MESSAGE FOR THE NURSE SELECT OPTION 2, PLEASE LEAVE A MESSAGE INCLUDING: YOUR NAME DATE OF BIRTH CALL BACK NUMBER REASON FOR CALL**this is important as we prioritize the call backs  YOU WILL RECEIVE A CALL BACK THE SAME DAY AS LONG AS YOU CALL BEFORE 4:00 PM

## 2023-11-25 LAB — T3, FREE: T3, Free: 3.1 pg/mL (ref 2.0–4.4)

## 2024-01-03 DIAGNOSIS — I509 Heart failure, unspecified: Secondary | ICD-10-CM | POA: Diagnosis not present

## 2024-01-03 DIAGNOSIS — E079 Disorder of thyroid, unspecified: Secondary | ICD-10-CM | POA: Diagnosis not present

## 2024-01-03 DIAGNOSIS — R42 Dizziness and giddiness: Secondary | ICD-10-CM | POA: Diagnosis not present

## 2024-01-03 DIAGNOSIS — H5789 Other specified disorders of eye and adnexa: Secondary | ICD-10-CM | POA: Diagnosis not present

## 2024-01-03 DIAGNOSIS — E0500 Thyrotoxicosis with diffuse goiter without thyrotoxic crisis or storm: Secondary | ICD-10-CM | POA: Diagnosis not present

## 2024-01-14 ENCOUNTER — Ambulatory Visit (HOSPITAL_COMMUNITY): Admission: EM | Admit: 2024-01-14 | Discharge: 2024-01-14 | Disposition: A | Discharge status: Home / Self Care

## 2024-01-14 ENCOUNTER — Encounter (HOSPITAL_COMMUNITY): Payer: Self-pay | Admitting: Emergency Medicine

## 2024-01-14 ENCOUNTER — Ambulatory Visit (INDEPENDENT_AMBULATORY_CARE_PROVIDER_SITE_OTHER)

## 2024-01-14 DIAGNOSIS — R0989 Other specified symptoms and signs involving the circulatory and respiratory systems: Secondary | ICD-10-CM | POA: Diagnosis not present

## 2024-01-14 DIAGNOSIS — R079 Chest pain, unspecified: Secondary | ICD-10-CM | POA: Diagnosis not present

## 2024-01-14 DIAGNOSIS — R42 Dizziness and giddiness: Secondary | ICD-10-CM

## 2024-01-14 DIAGNOSIS — J90 Pleural effusion, not elsewhere classified: Secondary | ICD-10-CM | POA: Diagnosis not present

## 2024-01-14 DIAGNOSIS — J9811 Atelectasis: Secondary | ICD-10-CM | POA: Diagnosis not present

## 2024-01-14 LAB — COMPREHENSIVE METABOLIC PANEL WITH GFR
ALT: 29 U/L (ref 0–44)
AST: 27 U/L (ref 15–41)
Albumin: 3.7 g/dL (ref 3.5–5.0)
Alkaline Phosphatase: 64 U/L (ref 38–126)
Anion gap: 11 (ref 5–15)
BUN: 8 mg/dL (ref 6–20)
CO2: 18 mmol/L — ABNORMAL LOW (ref 22–32)
Calcium: 9.1 mg/dL (ref 8.9–10.3)
Chloride: 108 mmol/L (ref 98–111)
Creatinine, Ser: 0.77 mg/dL (ref 0.44–1.00)
GFR, Estimated: >60 mL/min (ref >60)
Glucose, Bld: 86 mg/dL (ref 70–99)
Potassium: 3.9 mmol/L (ref 3.5–5.1)
Sodium: 137 mmol/L (ref 135–145)
Total Bilirubin: 1.4 mg/dL — ABNORMAL HIGH (ref 0.0–1.2)
Total Protein: 7.5 g/dL (ref 6.5–8.1)

## 2024-01-14 LAB — CBC WITH DIFFERENTIAL/PLATELET
Abs Immature Granulocytes: 0.03 K/uL (ref 0.00–0.07)
Basophils Absolute: 0.1 K/uL (ref 0.0–0.1)
Basophils Relative: 1 %
Eosinophils Absolute: 0.2 K/uL (ref 0.0–0.5)
Eosinophils Relative: 3 %
HCT: 42.7 % (ref 36.0–46.0)
Hemoglobin: 13.7 g/dL (ref 12.0–15.0)
Immature Granulocytes: 0 %
Lymphocytes Relative: 34 %
Lymphs Abs: 2.6 K/uL (ref 0.7–4.0)
MCH: 26.7 pg (ref 26.0–34.0)
MCHC: 32.1 g/dL (ref 30.0–36.0)
MCV: 83.1 fL (ref 80.0–100.0)
Monocytes Absolute: 0.5 K/uL (ref 0.1–1.0)
Monocytes Relative: 7 %
Neutro Abs: 4.1 K/uL (ref 1.7–7.7)
Neutrophils Relative %: 55 %
Platelets: 220 K/uL (ref 150–400)
RBC: 5.14 MIL/uL — ABNORMAL HIGH (ref 3.87–5.11)
RDW: 13.8 % (ref 11.5–15.5)
WBC: 7.5 K/uL (ref 4.0–10.5)
nRBC: 0 % (ref 0.0–0.2)

## 2024-01-14 LAB — TSH: TSH: 4.448 u[IU]/mL (ref 0.350–4.500)

## 2024-01-14 MED ORDER — AZELASTINE HCL 0.1 % NA SOLN: 1.0000 | Freq: Two times a day (BID) | NASAL | 1 refills | Status: DC

## 2024-01-14 MED ORDER — MECLIZINE HCL 25 MG PO TABS: 25.0000 mg | ORAL_TABLET | Freq: Three times a day (TID) | ORAL | 0 refills | Status: DC | PRN

## 2024-01-14 NOTE — Discharge Instructions (Addendum)
  1. Chest congestion (Primary) - DG Chest 2 View x-ray completed in UC shows no acute cardiopulmonary processes, no sign of significant, consolidation or pneumonia.  Final radiologist report still pending when available if any abnormality noted patient will be contacted and appropriate treatment provided. - CBC with Differential & TSH collected in UC and sent to lab for further testing - azelastine  (ASTELIN ) 0.1 % nasal spray; Place 1 spray into both nostrils 2 (two) times daily. Use in each nostril as directed  Dispense: 30 mL; Refill: 1  2. Vertigo - Comprehensive metabolic panel collected in UC and sent to lab for further testing - meclizine  (ANTIVERT ) 25 MG tablet; Take 1 tablet (25 mg total) by mouth 3 (three) times daily as needed for dizziness.  Dispense: 30 tablet; Refill: 0 -Continue to monitor symptoms for any change in severity if there is any escalation of current symptoms or development of new symptoms follow-up in ER for further evaluation and management.

## 2024-01-14 NOTE — ED Provider Notes (Signed)
 [facility]  Note:  This document was prepared using Dragon voice recognition software and may include unintentional dictation errors.  MRN: [id number] DOB: 07-05-1980  Subjective:   Sarah Reilly is a 43 y.o. female presenting for worsening dizziness and pressure in the left ear.  Patient reports that she has been having evaluation for chest pain, dizziness, for several months by cardiology and endocrinology with no resolution.  Patient has history of CHF and was concerned that her symptoms may be related to fluid overload.  Patient is here for evaluation of ongoing dizziness, left ear pressure and pain as well as chest congestion that she would like an chest x-ray for.  Patient also requesting laboratory testing just to make sure that her labs are normal.  No current facility-administered medications for this encounter.  Current Outpatient Medications:    azelastine  (ASTELIN ) 0.1 % nasal spray, Place 1 spray into both nostrils 2 (two) times daily. Use in each nostril as directed, Disp: 30 mL, Rfl: 1   meclizine  (ANTIVERT ) 25 MG tablet, Take 1 tablet (25 mg total) by mouth 3 (three) times daily as needed for dizziness., Disp: 30 tablet, Rfl: 0   acetaminophen  (TYLENOL ) 500 MG tablet, Take 500-1,000 mg by mouth 4 (four) times daily as needed (for pain)., Disp: , Rfl:    carvedilol  (COREG ) 25 MG tablet, Take 1 tablet (25 mg total) by mouth 2 (two) times daily with a meal., Disp: 180 tablet, Rfl: 3   digoxin  (LANOXIN ) 0.125 MG tablet, Take 1 tablet (0.125 mg total) by mouth daily., Disp: 90 tablet, Rfl: 3   lisinopril  (ZESTRIL ) 20 MG tablet, TAKE 1 TABLET (20 MG TOTAL) BY MOUTH DAILY., Disp: 30 tablet, Rfl: 11   methimazole  (TAPAZOLE ) 10 MG tablet, Take 20 mg by mouth daily., Disp: , Rfl:    Misc. Devices MISC, Blood pressure monitor.  Diagnosis hypertension, Disp: 1 each, Rfl: 0   potassium chloride  SA (KLOR-CON  M) 20 MEQ tablet, Take 20 mEq by mouth 2 (two) times a week.,  Disp: , Rfl:    spironolactone  (ALDACTONE ) 25 MG tablet, Take 12.5 mg by mouth 2 (two) times a week., Disp: , Rfl:    torsemide  (DEMADEX ) 20 MG tablet, Take 20 mg by mouth as needed., Disp: , Rfl:    Allergies  Allergen Reactions   Celery Oil Anaphylaxis   Shellfish Allergy Anaphylaxis   Entresto  [Sacubitril -Valsartan ] Other (See Comments)    GI upset   Hydrocodone Other (See Comments)    Reaction happened in the spine   Latex Rash and Other (See Comments)    EKG leads cause rashes- cannot tolerate for any extended length of time    Past Medical History:  Diagnosis Date   Cardiomyopathy    CHF (congestive heart failure) (HCC)    HTN (hypertension)    Mitral regurgitation      History reviewed. No pertinent surgical history.  Family History  Problem Relation Age of Onset   Heart disease Mother        No details   Hypertension Mother    Diabetes Mother    Hypertension Father    Heart murmur Sister     Social History   Tobacco Use   Smoking status: Former   Smokeless tobacco: Never  Substance Use Topics   Alcohol use: Yes    Alcohol/week: 4.0 standard drinks of alcohol    Types: 3 Glasses of wine, 1 Standard drinks or equivalent per week    Comment: Occassional  Drug use: Never    ROS Refer to HPI for ROS details.  Objective:   Vitals: BP 122/64 (BP Location: Left Arm)   Pulse 79   Temp 99.1 F (37.3 C) (Oral)   Resp 18   SpO2 98%   Physical Exam Vitals and nursing note reviewed.  Constitutional:      General: She is not in acute distress.    Appearance: She is well-developed. She is not ill-appearing or toxic-appearing.  HENT:     Head: Normocephalic and atraumatic.     Right Ear: Tympanic membrane, ear canal and external ear normal.     Left Ear: Tympanic membrane, ear canal and external ear normal.     Nose: Nose normal. No congestion or rhinorrhea.     Mouth/Throat:     Mouth: Mucous membranes are moist.     Pharynx: Oropharynx is clear. No  posterior oropharyngeal erythema.  Cardiovascular:     Rate and Rhythm: Normal rate and regular rhythm.     Heart sounds: Normal heart sounds. No murmur heard. Pulmonary:     Effort: Pulmonary effort is normal. No respiratory distress.     Breath sounds: Normal breath sounds. No stridor. No wheezing, rhonchi or rales.  Chest:     Chest wall: No tenderness.  Skin:    General: Skin is warm and dry.  Neurological:     General: No focal deficit present.     Mental Status: She is alert and oriented to person, place, and time.  Psychiatric:        Mood and Affect: Mood normal.        Behavior: Behavior normal.     Procedures  No results found for this or any previous visit (from the past 24 hours).  No results found.   Assessment and Plan :     Discharge Instructions       1. Chest congestion (Primary) - DG Chest 2 View x-ray completed in UC shows no acute cardiopulmonary processes, no sign of significant, consolidation or pneumonia.  Final radiologist report still pending when available if any abnormality noted patient will be contacted and appropriate treatment provided. - CBC with Differential & TSH collected in UC and sent to lab for further testing - azelastine  (ASTELIN ) 0.1 % nasal spray; Place 1 spray into both nostrils 2 (two) times daily. Use in each nostril as directed  Dispense: 30 mL; Refill: 1  2. Vertigo - Comprehensive metabolic panel collected in UC and sent to lab for further testing - meclizine  (ANTIVERT ) 25 MG tablet; Take 1 tablet (25 mg total) by mouth 3 (three) times daily as needed for dizziness.  Dispense: 30 tablet; Refill: 0 -Continue to monitor symptoms for any change in severity if there is any escalation of current symptoms or development of new symptoms follow-up in ER for further evaluation and management.      Genieve Ramaswamy B Zakiah Gauthreaux   Blake V[place]rano, [place], [place] 01/14/24 [phone]

## 2024-01-14 NOTE — ED Triage Notes (Signed)
 Pt reports had chest pain for years and this pain has been ongoing for months. Sees cardiology for it. Told doctor about dizziness in April and had testing done and unsure what causing those symptoms.  Saw endocrinologist last week and was asking about ears. They no the thyroid  not causing the chest pains or vertigo but dont know cause.  Reports does have mold in her apartment complex and doesn't know if that is what is causing her symptoms bc dizziness does get better when outside of home.   Pt reports does have Pressure in ear and causing pain in left temple.

## 2024-01-15 ENCOUNTER — Ambulatory Visit (HOSPITAL_COMMUNITY): Payer: Self-pay

## 2024-01-17 DIAGNOSIS — R42 Dizziness and giddiness: Secondary | ICD-10-CM | POA: Diagnosis not present

## 2024-01-18 ENCOUNTER — Emergency Department (HOSPITAL_COMMUNITY)

## 2024-01-18 ENCOUNTER — Emergency Department (HOSPITAL_COMMUNITY)
Admission: EM | Admit: 2024-01-18 | Discharge: 2024-01-19 | Disposition: A | Discharge status: Home / Self Care | Source: Ambulatory Visit | Attending: Emergency Medicine | Admitting: Emergency Medicine

## 2024-01-18 ENCOUNTER — Telehealth: Admitting: Physician Assistant

## 2024-01-18 ENCOUNTER — Encounter (HOSPITAL_COMMUNITY): Payer: Self-pay | Admitting: Emergency Medicine

## 2024-01-18 ENCOUNTER — Other Ambulatory Visit: Payer: Self-pay

## 2024-01-18 DIAGNOSIS — R079 Chest pain, unspecified: Secondary | ICD-10-CM

## 2024-01-18 DIAGNOSIS — R0781 Pleurodynia: Secondary | ICD-10-CM | POA: Diagnosis present

## 2024-01-18 DIAGNOSIS — R002 Palpitations: Secondary | ICD-10-CM | POA: Diagnosis not present

## 2024-01-18 DIAGNOSIS — I509 Heart failure, unspecified: Secondary | ICD-10-CM | POA: Insufficient documentation

## 2024-01-18 DIAGNOSIS — R42 Dizziness and giddiness: Secondary | ICD-10-CM

## 2024-01-18 DIAGNOSIS — J189 Pneumonia, unspecified organism: Secondary | ICD-10-CM | POA: Insufficient documentation

## 2024-01-18 DIAGNOSIS — R0789 Other chest pain: Secondary | ICD-10-CM | POA: Diagnosis not present

## 2024-01-18 DIAGNOSIS — Z91040 Latex allergy status: Secondary | ICD-10-CM | POA: Diagnosis not present

## 2024-01-18 DIAGNOSIS — R918 Other nonspecific abnormal finding of lung field: Secondary | ICD-10-CM | POA: Diagnosis not present

## 2024-01-18 LAB — TROPONIN I (HIGH SENSITIVITY)
Troponin I (High Sensitivity): 6 ng/L (ref <18)
Troponin I (High Sensitivity): 5 ng/L (ref <18)

## 2024-01-18 LAB — CBC
HCT: 41.6 % (ref 36.0–46.0)
Hemoglobin: 12.9 g/dL (ref 12.0–15.0)
MCH: 26.4 pg (ref 26.0–34.0)
MCHC: 31 g/dL (ref 30.0–36.0)
MCV: 85.1 fL (ref 80.0–100.0)
Platelets: 204 K/uL (ref 150–400)
RBC: 4.89 MIL/uL (ref 3.87–5.11)
RDW: 13.7 % (ref 11.5–15.5)
WBC: 6.2 K/uL (ref 4.0–10.5)
nRBC: 0 % (ref 0.0–0.2)

## 2024-01-18 LAB — BASIC METABOLIC PANEL WITH GFR
Anion gap: 9 (ref 5–15)
BUN: 11 mg/dL (ref 6–20)
CO2: 21 mmol/L — ABNORMAL LOW (ref 22–32)
Calcium: 9.2 mg/dL (ref 8.9–10.3)
Chloride: 106 mmol/L (ref 98–111)
Creatinine, Ser: 0.6 mg/dL (ref 0.44–1.00)
GFR, Estimated: >60 mL/min (ref >60)
Glucose, Bld: 141 mg/dL — ABNORMAL HIGH (ref 70–99)
Potassium: 3.5 mmol/L (ref 3.5–5.1)
Sodium: 136 mmol/L (ref 135–145)

## 2024-01-18 LAB — BRAIN NATRIURETIC PEPTIDE: B Natriuretic Peptide: 167.1 pg/mL — ABNORMAL HIGH (ref 0.0–100.0)

## 2024-01-18 LAB — D-DIMER, QUANTITATIVE: D-Dimer, Quant: 0.52 ug{FEU}/mL — ABNORMAL HIGH (ref 0.00–0.50)

## 2024-01-18 LAB — MAGNESIUM: Magnesium: 2.1 mg/dL (ref 1.7–2.4)

## 2024-01-18 LAB — HCG, SERUM, QUALITATIVE: Preg, Serum: NEGATIVE

## 2024-01-18 MED ORDER — ASPIRIN 81 MG PO CHEW
324.0000 mg | CHEWABLE_TABLET | Freq: Once | ORAL | Status: DC
  Filled 2024-01-18: qty 4

## 2024-01-18 NOTE — ED Triage Notes (Signed)
 Patient c/o on and off chest pain x 3 months. Patient report vertigo and dizziness at times. Patient denies SOB. Patient denies N/V. Hx CHF

## 2024-01-18 NOTE — Discharge Instructions (Signed)
 IMPORTANT PATIENT INSTRUCTIONS: You have been scheduled for an Outpatient Vascular Study at [facility].  If tomorrow is a Saturday, Sunday or holiday, please go to the [facility] Emergency Department Registration Desk at 11 am tomorrow morning and tell them you are there for a vascular study.  If tomorrow is a weekday (Monday-Friday), please go to the [facility] (address [address] [place]) at 8 am and report to the 4th floor registration Zone A.  Inform registration that you are there for a vascular study.

## 2024-01-18 NOTE — Patient Instructions (Signed)
  Sarah Reilly, thank you for joining Sarah Shuck, PA-C for today's virtual visit.  While this provider is not your primary care provider (PCP), if your PCP is located in our provider database this encounter information will be shared with them immediately following your visit.   A Sarah Reilly account gives you access to today's visit and all your visits, tests, and labs performed at [facility]  click here if you don't have a [name] account or go to [email or web address]  Consent: (Patient) Sarah Reilly provided verbal consent for this virtual visit at the beginning of the encounter.  Current Medications:  Current Outpatient Medications:    acetaminophen  (TYLENOL ) 500 MG tablet, Take 500-1,000 mg by mouth 4 (four) times daily as needed (for pain)., Disp: , Rfl:    azelastine  (ASTELIN ) 0.1 % nasal spray, Place 1 spray into both nostrils 2 (two) times daily. Use in each nostril as directed, Disp: 30 mL, Rfl: 1   carvedilol  (COREG ) 25 MG tablet, Take 1 tablet (25 mg total) by mouth 2 (two) times daily with a meal., Disp: 180 tablet, Rfl: 3   digoxin  (LANOXIN ) 0.125 MG tablet, Take 1 tablet (0.125 mg total) by mouth daily., Disp: 90 tablet, Rfl: 3   lisinopril  (ZESTRIL ) 20 MG tablet, TAKE 1 TABLET (20 MG TOTAL) BY MOUTH DAILY., Disp: 30 tablet, Rfl: 11   meclizine  (ANTIVERT ) 25 MG tablet, Take 1 tablet (25 mg total) by mouth 3 (three) times daily as needed for dizziness., Disp: 30 tablet, Rfl: 0   methimazole  (TAPAZOLE ) 10 MG tablet, Take 20 mg by mouth daily., Disp: , Rfl:    Misc. Devices MISC, Blood pressure monitor.  Diagnosis hypertension, Disp: 1 each, Rfl: 0   potassium chloride  SA (KLOR-CON  M) 20 MEQ tablet, Take 20 mEq by mouth 2 (two) times a week., Disp: , Rfl:    spironolactone  (ALDACTONE ) 25 MG tablet, Take 12.5 mg by mouth 2 (two) times a week., Disp: , Rfl:    torsemide  (DEMADEX ) 20 MG tablet, Take 20 mg by mouth as needed., Disp: ,  Rfl:    Medications ordered in this encounter:  No orders of the defined types were placed in this encounter.    *If you need refills on other medications prior to your next appointment, please contact your pharmacy*  Follow-Up: Call back or seek an in-person evaluation if the symptoms worsen or if the condition fails to improve as anticipated.  [facility] [phone]  Other Instructions Report to nearest Emergency Room.   If you have been instructed to have an in-person evaluation today at a local Urgent Care facility, please use the link below. It will take you to a list of all of our available [facility], including address, phone number and hours of operation. Please do not delay care.  [facility]  If you or a family member do not have a primary care provider, use the link below to schedule a visit and establish care. When you choose a [facility] primary care physician or advanced practice provider, you gain a long-term partner in health. Find a Primary Care Provider  Learn more about [facility]'s in-office and virtual care options: [facility] - Get Care Now

## 2024-01-18 NOTE — ED Provider Notes (Signed)
 [facility] EMERGENCY DEPARTMENT AT [facility] Provider Note   CSN: [id number] Arrival date & time: 01/18/24  1730     Patient presents with: Chest Pain   Sarah Reilly is a 43 y.o. female.   HPI 43 year old female presents with a chief complaint of chest pain.  She has been dealing with chest pain for months as well as vertigo for months.  Recently saw an ENT yesterday and was told she will need more outpatient testing but no diagnosis was given.  The vertigo is positional and primarily at home or when laying flat.  However the chest pain she has been having for months whenever she goes into a certain position.  However since yesterday, she is having a new chest pain that is sharp and in the midline and is worse with deep inspiration.  This is not her typical chest pain.  No specific shortness of breath unless she walks a long way which is not atypical for her.  She does not feel like she is having a heart failure exacerbation as she does not have any cough or significant dyspnea on exertion.  She does feel like that recently her right lower leg has been uncomfortable and a little more swollen than the left.  Patient is worried about mold where she lives causing a lot of her symptoms.  Prior to Admission medications   Medication Sig Start Date End Date Taking? Authorizing Provider  acetaminophen  (TYLENOL ) 500 MG tablet Take 500-1,000 mg by mouth 4 (four) times daily as needed (for pain).    [provider]  azelastine  (ASTELIN ) 0.1 % nasal spray Place 1 spray into both nostrils 2 (two) times daily. Use in each nostril as directed 01/14/24   Reddick, Johnathan B, NP  carvedilol  (COREG ) 25 MG tablet Take 1 tablet (25 mg total) by mouth 2 (two) times daily with a meal. 11/03/23   [place]ford, Harlene HERO, FNP  digoxin  (LANOXIN ) 0.125 MG tablet Take 1 tablet (0.125 mg total) by mouth daily. 11/03/23   [place]ford, Harlene HERO, FNP  lisinopril  (ZESTRIL ) 20 MG tablet TAKE 1 TABLET (20 MG  TOTAL) BY MOUTH DAILY. 11/23/23   [name], Toribio SAUNDERS, MD  meclizine  (ANTIVERT ) 25 MG tablet Take 1 tablet (25 mg total) by mouth 3 (three) times daily as needed for dizziness. 01/14/24   Reddick, Johnathan B, NP  methimazole  (TAPAZOLE ) 10 MG tablet Take 20 mg by mouth daily.    [provider]  Misc. Devices MISC Blood pressure monitor.  Diagnosis hypertension 04/29/22   Delbert Clam, MD  potassium chloride  SA (KLOR-CON  M) 20 MEQ tablet Take 20 mEq by mouth 2 (two) times a week.    [provider]  spironolactone  (ALDACTONE ) 25 MG tablet Take 12.5 mg by mouth 2 (two) times a week.    [provider]  torsemide  (DEMADEX ) 20 MG tablet Take 20 mg by mouth as needed.    [provider]    Allergies: Celery oil, Shellfish allergy, Entresto  [sacubitril -valsartan ], Hydrocodone, and Latex    Review of Systems  Constitutional:  Negative for fever.  Respiratory:  Negative for cough.   Cardiovascular:  Positive for chest pain and leg swelling.  Gastrointestinal:  Negative for abdominal pain.  Neurological:  Positive for dizziness.    Updated Vital Signs BP (!) 151/103   Pulse 94   Temp 98.4 F (36.9 C) (Oral)   Resp 18   SpO2 100%   Physical Exam Vitals and nursing note reviewed.  Constitutional:  General: She is not in acute distress.    Appearance: She is well-developed. She is not ill-appearing or diaphoretic.  HENT:     Head: Normocephalic and atraumatic.  Cardiovascular:     Rate and Rhythm: Normal rate and regular rhythm.     Heart sounds: Normal heart sounds.  Pulmonary:     Effort: Pulmonary effort is normal.     Breath sounds: Normal breath sounds.  Abdominal:     Palpations: Abdomen is soft.     Tenderness: There is no abdominal tenderness.  Musculoskeletal:     Comments: No significant edema in either lower leg appreciated  Skin:    General: Skin is warm and dry.  Neurological:     Mental Status: She is alert.     Comments:  Normal ambulation.     (all labs ordered are listed, but only abnormal results are displayed) Labs Reviewed  BASIC METABOLIC PANEL WITH GFR - Abnormal; Notable for the following components:      Result Value   CO2 21 (*)    Glucose, Bld 141 (*)    All other components within normal limits  BRAIN NATRIURETIC PEPTIDE - Abnormal; Notable for the following components:   B Natriuretic Peptide 167.1 (*)    All other components within normal limits  D-DIMER, QUANTITATIVE - Abnormal; Notable for the following components:   D-Dimer, Quant 0.52 (*)    All other components within normal limits  CBC  HCG, SERUM, QUALITATIVE  MAGNESIUM  TROPONIN I (HIGH SENSITIVITY)  TROPONIN I (HIGH SENSITIVITY)    EKG: EKG Interpretation Date/Time:  Thursday January 18 2024 21:14:26 EDT Ventricular Rate:  84 PR Interval:  212 QRS Duration:  85 QT Interval:  368 QTC Calculation: 435 R Axis:   30  Text Interpretation: Sinus rhythm Prolonged PR interval LAE, consider biatrial enlargement Probable LVH with secondary repol abnrm similar to May 2025 Confirmed by Freddi Hamilton [phone]) on 01/18/2024 9:30:41 PM  Radiology: DG Chest 2 View Result Date: 01/18/2024 CLINICAL DATA:  Left-sided upper chest pain with palpitations. EXAM: CHEST - 2 VIEW COMPARISON:  January 14, 2024 FINDINGS: The cardiac silhouette is borderline in size and unchanged in appearance. Mild, stable atelectasis and/or infiltrate is seen within the right lung base. No pleural effusion or pneumothorax is identified. The visualized skeletal structures are unremarkable. IMPRESSION: Mild, stable right basilar atelectasis and/or infiltrate. Electronically Signed   By: Suzen Dials M.D.   On: 01/18/2024 21:31     Procedures   Medications Ordered in the ED  aspirin  chewable tablet 324 mg (324 mg Oral Patient Refused/Not Given 01/18/24 2145)                                    Medical Decision Making Amount and/or Complexity of Data  Reviewed Labs: ordered.    Details: Troponins negative x 2, mildly elevated D-dimer Radiology: ordered and independent interpretation performed.    Details: No CHF ECG/medicine tests: ordered and independent interpretation performed.    Details: Nonspecific changes but unchanged from baseline  Risk OTC drugs.   Patient's chest pain is pretty atypical and is sharp and pleuritic.  Given the pleuritic symptoms including her feeling like her leg is swollen (though I do not appreciate this on exam), will workup for PE.  D-dimer is elevated so we will get CTA.  I have ordered an outpatient DVT ultrasound if this is negative as there  is no ultrasonographer here tonight.  Otherwise, is well-appearing.  I doubt this is ACS.  Care transferred to Dr. Griselda.     Final diagnoses:  None    ED Discharge Orders     None          Freddi Hamilton, MD 01/18/24 2350

## 2024-01-18 NOTE — Progress Notes (Signed)
 Virtual Visit Consent   Sarah Reilly, you are scheduled for a virtual visit with a [facility] provider today. Just as with appointments in the office, your consent must be obtained to participate. Your consent will be active for this visit and any virtual visit you may have with one of our providers in the next 365 days. If you have a MyChart account, a copy of this consent can be sent to you electronically.  As this is a virtual visit, video technology does not allow for your provider to perform a traditional examination. This may limit your provider's ability to fully assess your condition. If your provider identifies any concerns that need to be evaluated in person or the need to arrange testing (such as labs, EKG, etc.), we will make arrangements to do so. Although advances in technology are sophisticated, we cannot ensure that it will always work on either your end or our end. If the connection with a video visit is poor, the visit may have to be switched to a telephone visit. With either a video or telephone visit, we are not always able to ensure that we have a secure connection.  By engaging in this virtual visit, you consent to the provision of healthcare and authorize for your insurance to be billed (if applicable) for the services provided during this visit. Depending on your insurance coverage, you may receive a charge related to this service.  I need to obtain your verbal consent now. Are you willing to proceed with your visit today? Sarah Reilly has provided verbal consent on 01/18/2024 for a virtual visit (video or telephone). Sarah Reilly, [place]  Date: 01/18/2024 2:46 PM   Virtual Visit via Video Note   I, Sarah Reilly, connected with  Sarah Reilly  ([id number], 29-Sep-1980) on 01/18/24 at  2:45 PM EDT by a video-enabled telemedicine application and verified that I am speaking with the correct person using two identifiers.  Location: Patient: Virtual Visit Location  Patient: Home Provider: Virtual Visit Location Provider: Home Office   I discussed the limitations of evaluation and management by telemedicine and the availability of in person appointments. The patient expressed understanding and agreed to proceed.    History of Present Illness: Sarah Reilly is a 43 y.o. who identifies as a female who was assigned female at birth, and is being seen today for chest pain and vertigo.  HPI: Chest Pain  This is a new problem. The current episode started today. The onset quality is undetermined. The problem occurs constantly. The problem has been unchanged. The pain is present in the substernal region. The pain is at a severity of 0/10. The pain is mild. The pain does not radiate. Associated symptoms include palpitations. The pain is aggravated by nothing. She has tried nothing for the symptoms. The treatment provided no relief.    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   SOB (shortness of breath) 11/03/2017   Severe mitral regurgitation 07/20/2009   CHEST PAIN 07/20/2009   Essential hypertension 06/16/2009   Agoraphobia 04/22/2009   Migraine headache 04/22/2009  Pyelonephritis 04/22/2009   SYNCOPE 04/22/2009   DIZZINESS 04/22/2009   PALPITATIONS 04/22/2009   ELECTROCARDIOGRAM, ABNORMAL 04/22/2009    Allergies:  Allergies  Allergen Reactions   Celery Oil Anaphylaxis   Shellfish Allergy Anaphylaxis   Entresto  [Sacubitril -Valsartan ] Other (See Comments)    GI upset   Hydrocodone Other (See Comments)     Reaction happened in the spine   Latex Rash and Other (See Comments)    EKG leads cause rashes- cannot tolerate for any extended length of time   Medications:  Current Outpatient Medications:    acetaminophen  (TYLENOL ) 500 MG tablet, Take 500-1,000 mg by mouth 4 (four) times daily as needed (for pain)., Disp: , Rfl:    azelastine  (ASTELIN ) 0.1 % nasal spray, Place 1 spray into both nostrils 2 (two) times daily. Use in each nostril as directed, Disp: 30 mL, Rfl: 1   carvedilol  (COREG ) 25 MG tablet, Take 1 tablet (25 mg total) by mouth 2 (two) times daily with a meal., Disp: 180 tablet, Rfl: 3   digoxin  (LANOXIN ) 0.125 MG tablet, Take 1 tablet (0.125 mg total) by mouth daily., Disp: 90 tablet, Rfl: 3   lisinopril  (ZESTRIL ) 20 MG tablet, TAKE 1 TABLET (20 MG TOTAL) BY MOUTH DAILY., Disp: 30 tablet, Rfl: 11   meclizine  (ANTIVERT ) 25 MG tablet, Take 1 tablet (25 mg total) by mouth 3 (three) times daily as needed for dizziness., Disp: 30 tablet, Rfl: 0   methimazole  (TAPAZOLE ) 10 MG tablet, Take 20 mg by mouth daily., Disp: , Rfl:    Misc. Devices MISC, Blood pressure monitor.  Diagnosis hypertension, Disp: 1 each, Rfl: 0   potassium chloride  SA (KLOR-CON  M) 20 MEQ tablet, Take 20 mEq by mouth 2 (two) times a week., Disp: , Rfl:    spironolactone  (ALDACTONE ) 25 MG tablet, Take 12.5 mg by mouth 2 (two) times a week., Disp: , Rfl:    torsemide  (DEMADEX ) 20 MG tablet, Take 20 mg by mouth as needed., Disp: , Rfl:   Observations/Objective: Patient is well-developed, well-nourished in no acute distress.  Resting comfortably  at home.  Head is normocephalic, atraumatic.  No labored breathing.  Speech is clear and coherent with logical content.  Patient is alert and oriented at baseline.    Assessment and Plan: 1. Chest pain, unspecified type (Primary)  2. Dizziness  Patient presenting with ongoing chest pain and dizziness on today. I advised her that she needed to present in person for  evaluation. Patient appears to be upset about her care. I advised the patient that given her history and ongoing issues, she will need to be seen in person. Extensive counseling provided to patient on today. No active or emergent conditions identified. Patient is to report to the nearest ER for evaluation.    Follow Up Instructions: I discussed the assessment and treatment plan with the patient. The patient was provided an opportunity to ask questions and all were answered. The patient agreed with the plan and demonstrated an understanding of the instructions.  A copy of instructions were sent to the patient via MyChart unless otherwise noted below.     The patient was advised to call back or seek an in-person evaluation if the symptoms worsen or if the condition fails to improve as anticipated.    Sarah Shuck, PA-C

## 2024-01-18 NOTE — ED Provider Triage Note (Signed)
 Emergency Medicine Provider Triage Evaluation Note  TERRI RORRER , a 43 y.o. female  was evaluated in triage.  Pt complains of CP located in left upper chest. Has some vertigo. Ongoing for months/years. Nothing has changed. No SOB. No cough, fever. She is wondering if this is from toxins in her home.   Review of Systems  Positive: CP Negative: Cough, fever  Physical Exam  There were no vitals taken for this visit. Gen:   Awake, no distress   Resp:  Normal effort  MSK:   Moves extremities without difficulty  Other:    Medical Decision Making  Medically screening exam initiated at 5:42 PM.  Appropriate orders placed.  CONCHITA TRUXILLO was informed that the remainder of the evaluation will be completed by another provider, this initial triage assessment does not replace that evaluation, and the importance of remaining in the ED until their evaluation is complete.     Shermon Warren SAILOR, PA-C 01/18/24 1744

## 2024-01-19 ENCOUNTER — Telehealth (HOSPITAL_COMMUNITY): Payer: Self-pay | Admitting: Cardiology

## 2024-01-19 ENCOUNTER — Ambulatory Visit (HOSPITAL_COMMUNITY)
Admission: RE | Admit: 2024-01-19 | Discharge: 2024-01-19 | Disposition: A | Discharge status: Home / Self Care | Source: Ambulatory Visit | Attending: Emergency Medicine | Admitting: Emergency Medicine

## 2024-01-19 DIAGNOSIS — R609 Edema, unspecified: Secondary | ICD-10-CM | POA: Diagnosis not present

## 2024-01-19 DIAGNOSIS — M79661 Pain in right lower leg: Secondary | ICD-10-CM | POA: Insufficient documentation

## 2024-01-19 DIAGNOSIS — M7989 Other specified soft tissue disorders: Secondary | ICD-10-CM | POA: Diagnosis not present

## 2024-01-19 DIAGNOSIS — R918 Other nonspecific abnormal finding of lung field: Secondary | ICD-10-CM | POA: Diagnosis not present

## 2024-01-19 DIAGNOSIS — J90 Pleural effusion, not elsewhere classified: Secondary | ICD-10-CM | POA: Diagnosis not present

## 2024-01-19 DIAGNOSIS — J9811 Atelectasis: Secondary | ICD-10-CM | POA: Diagnosis not present

## 2024-01-19 DIAGNOSIS — J189 Pneumonia, unspecified organism: Secondary | ICD-10-CM | POA: Diagnosis not present

## 2024-01-19 MED ORDER — AMOXICILLIN-POT CLAVULANATE 875-125 MG PO TABS: 1.0000 | ORAL_TABLET | Freq: Two times a day (BID) | ORAL | 0 refills | Status: DC

## 2024-01-19 MED ORDER — AMOXICILLIN-POT CLAVULANATE 875-125 MG PO TABS
1.0000 | ORAL_TABLET | Freq: Once | ORAL | Status: AC
  Administered 2024-01-19: 1 via ORAL
  Filled 2024-01-19: qty 1

## 2024-01-19 MED ORDER — IOHEXOL 350 MG/ML SOLN
100.0000 mL | Freq: Once | INTRAVENOUS | Status: AC | PRN
  Administered 2024-01-19: 100 mL via INTRAVENOUS

## 2024-01-19 NOTE — Telephone Encounter (Signed)
 Patient seen in the ER and prescribed Augmentin for PNA, patient wanted to confirm if there was an interaction with cardiac meds. Was under the impression in ER augmentin could interact with digoxin 

## 2024-01-19 NOTE — Telephone Encounter (Signed)
 There are no interactions between Augmentin and digoxin  or Augmentin and her other cardiac medications. This is safe to take.   After reviewing notes from ER, the following was documented: Given her digoxin  this is limit ability to treat with dual agents due to risk of side effects. Will treat with single coverage with Augmentin. It appears they are not dual covering her for pneumonia (with additional antibiotics) because of the digoxin . Since there are no interactions with digoxin  and augmentin, they decided the safest course was to just use augmentin to treat the pneumonia. Hopefully that reassures the patient.

## 2024-01-28 DIAGNOSIS — I509 Heart failure, unspecified: Secondary | ICD-10-CM | POA: Diagnosis not present

## 2024-01-28 DIAGNOSIS — J189 Pneumonia, unspecified organism: Secondary | ICD-10-CM | POA: Diagnosis not present

## 2024-01-28 DIAGNOSIS — N760 Acute vaginitis: Secondary | ICD-10-CM | POA: Diagnosis not present

## 2024-01-28 DIAGNOSIS — I493 Ventricular premature depolarization: Secondary | ICD-10-CM | POA: Diagnosis not present

## 2024-01-28 DIAGNOSIS — R002 Palpitations: Secondary | ICD-10-CM | POA: Diagnosis not present

## 2024-02-02 ENCOUNTER — Telehealth (HOSPITAL_COMMUNITY): Payer: Self-pay | Admitting: Cardiology

## 2024-02-02 DIAGNOSIS — R42 Dizziness and giddiness: Secondary | ICD-10-CM | POA: Diagnosis not present

## 2024-02-02 NOTE — Telephone Encounter (Signed)
 Patient called after urgent care visit for CP and palps, reports she was given EKG and advised to follow up with cardiology  (ADD ON MADE 8/11 @ 130)   Currently follow up is not due until December   With recurrent CP and palps, pt would like to know what to do next. -denies active CP or palps -report no swelling,weight gain SOB  Please advise

## 2024-02-02 NOTE — Telephone Encounter (Signed)
 117/81 HR 82 done ~ 1 hour ago at audiology appt    -reports she was told by urgent care provider stated it was abnormal and given copy to give to provider, no additional details provided with EKG and symptoms  -reports CP and palps are related to pneumonia Pt did have pneumonia 12/2023, repeat CXR at [facility] 8/3 neg for pneumonia Follow up with PCP 02/07/2024 no appt with pulm  Pt reports compliance with all medications

## 2024-02-02 NOTE — Progress Notes (Signed)
 [facility] - [place] [facility] Note  Patient Name: Sarah Reilly   Patient DOB: Aug 22, 1980                Patient Age: 43 y.o.    Reason for Visit: Sarah Reilly is here with her daughter, at the request of Vaughan BIRCH. Carlie, MD for concerns of episodic vertigo, accompanied by aural pressure. Case history reveals a recent bout of pneumonia, migraines, congestive heart failure and Graves Disease.  Given these concerns, a comprehensive hearing test was completed to assess Sarah Reilly's hearing ability and speech recognition abilities.    Results: Refer to results scanned into Media tab.  Otoscopy: Otoscopic examination showed canals free of obstructing cerumen for both ears. Tympanic membranes appear healthy and a cone of light reflex is present for both ears.   Tympanometry: Tympanometry was completed due to reported history. Results were: Right Ear: Type A - Normal compliance, middle ear pressure, and ear canal volume. Left Ear: Type A - Normal compliance, middle ear pressure, and ear canal volume.  Audiometric Results: Results were obtained using headphones. Test reliability was good.  Test was completed in Sound [place] 1 and printed out using [employer] Suite. The Sound [place] was last calibrated on March 20, 2023.  Puretone Thresholds: Right ear results show normal hearing for the frequencies [phone] Hz.  Left ear results show normal hearing for the frequencies [phone].   Results are symmetric.  Speech Reception Thresholds: Completed using monitored live voice. Right Ear: 10 dBHL; which is in agreement with PTA.  Left Ear: 10 dBHL; which is in agreement with PTA.   Word Recognition Testing: Completed using a recording of CID-W22. Right Ear: 100 % at 55 dBHL with 35 dBem, which is a normal listening level. Left Ear: 96 % at 55 dBHL with 35 dBem, which is a normal listening level.  The Dizziness Short Form Questionnaire was administered  due to the patient's concerns. The patient was given 8 options from which to choose the best description of their symptoms. These 8 options provide a general overview of unique etiologies, including Benign Paroxysmal Positional Vertigo, vestibular migraine, and Meniere's Disease.   Per patient report, symptoms began October 2024.  The patient chose option 1 to best represent their concern. This option is typically associated with Benign Paroxysmal Positional Vertigo (BPPV).  Additionally, the intensity of the concerns were self-ranked to be on a scale from 1 to 10, with 1 being mild and 10 being debilitating.  Impression: Normal hearing with normal middle ear function. Sarah Reilly was not feeling well enough for the vestibular portion of today's assessment. She reports chest pains and palpitations. She was checked by our head nurse Gerard Rily, RN, with BP 117/81 and 82 pulse rate. She was cleared for the hearing evaluation and advised to reach out to her cardiologist regarding her symptoms.  Recommendations: Recommend repeat hearing test in conjunction with otologic care, sooner with concerns of sudden hearing loss, deteriorating hearing, or new or worsening tinnitus. Recommend proper use of adequate hearing protection in hazardous noise. Recommend consideration of referral to PT for vestibular assessment and treatment, pending medical clearance.

## 2024-02-05 ENCOUNTER — Encounter (HOSPITAL_COMMUNITY): Payer: Self-pay

## 2024-02-05 ENCOUNTER — Ambulatory Visit (HOSPITAL_COMMUNITY)
Admission: RE | Admit: 2024-02-05 | Discharge: 2024-02-05 | Disposition: A | Discharge status: Home / Self Care | Source: Ambulatory Visit | Attending: Physician Assistant | Admitting: Physician Assistant

## 2024-02-05 VITALS — BP 120/84 | HR 78 | Ht 67.0 in | Wt 250.8 lb

## 2024-02-05 DIAGNOSIS — Z6839 Body mass index (BMI) 39.0-39.9, adult: Secondary | ICD-10-CM | POA: Diagnosis not present

## 2024-02-05 DIAGNOSIS — E0590 Thyrotoxicosis, unspecified without thyrotoxic crisis or storm: Secondary | ICD-10-CM | POA: Insufficient documentation

## 2024-02-05 DIAGNOSIS — R0789 Other chest pain: Secondary | ICD-10-CM | POA: Insufficient documentation

## 2024-02-05 DIAGNOSIS — E669 Obesity, unspecified: Secondary | ICD-10-CM | POA: Insufficient documentation

## 2024-02-05 DIAGNOSIS — R002 Palpitations: Secondary | ICD-10-CM | POA: Insufficient documentation

## 2024-02-05 DIAGNOSIS — I340 Nonrheumatic mitral (valve) insufficiency: Secondary | ICD-10-CM | POA: Diagnosis not present

## 2024-02-05 DIAGNOSIS — R42 Dizziness and giddiness: Secondary | ICD-10-CM | POA: Diagnosis not present

## 2024-02-05 DIAGNOSIS — R079 Chest pain, unspecified: Secondary | ICD-10-CM | POA: Diagnosis not present

## 2024-02-05 DIAGNOSIS — I110 Hypertensive heart disease with heart failure: Secondary | ICD-10-CM | POA: Diagnosis not present

## 2024-02-05 DIAGNOSIS — Z87448 Personal history of other diseases of urinary system: Secondary | ICD-10-CM | POA: Diagnosis not present

## 2024-02-05 DIAGNOSIS — I5022 Chronic systolic (congestive) heart failure: Secondary | ICD-10-CM

## 2024-02-05 DIAGNOSIS — Z79899 Other long term (current) drug therapy: Secondary | ICD-10-CM | POA: Diagnosis not present

## 2024-02-05 DIAGNOSIS — F4000 Agoraphobia, unspecified: Secondary | ICD-10-CM | POA: Insufficient documentation

## 2024-02-05 DIAGNOSIS — I428 Other cardiomyopathies: Secondary | ICD-10-CM | POA: Insufficient documentation

## 2024-02-05 DIAGNOSIS — R9431 Abnormal electrocardiogram [ECG] [EKG]: Secondary | ICD-10-CM | POA: Diagnosis not present

## 2024-02-05 DIAGNOSIS — Z87891 Personal history of nicotine dependence: Secondary | ICD-10-CM | POA: Diagnosis not present

## 2024-02-05 NOTE — Progress Notes (Signed)
 02/05/2024  Sarah Reilly DOB: Jan 05, 1981 MRN: [id number]   RIDER WAIVER AND RELEASE OF LIABILITY  For the purposes of helping with transportation needs, [facility] partners with outside transportation providers (taxi companies, [place], Catering manager.) to give [facility] patients or other approved people the choice of on-demand rides Public librarian) to our buildings for non-emergency visits.  By using [employer], I, the person signing this document, on behalf of myself and/or any legal minors (in my care using the [employer]), agree:  Science writer given to me are supplied by independent, outside transportation providers who do not work for, or have any affiliation with, [employer]. [facility] is not a transportation company. [facility] has no control over the quality or safety of the rides I get using [employer]. [facility] has no control over whether any outside ride will happen on time or not. [facility] gives no guarantee on the reliability, quality, safety, or availability on any rides, or that no mistakes will happen. I know and accept that traveling by vehicle (car, truck, SVU, fleeta, bus, taxi, etc.) has risks of serious injuries such as disability, being paralyzed, and death. I know and agree the risk of using [employer] is mine alone, and not [employer]. [employer] are provided as is and as are available. The transportation providers are in charge for all inspections and care of the vehicles used to provide these rides. I agree not to take legal action against [facility], its agents, employees, officers, directors, representatives, insurers, attorneys, assigns, successors, subsidiaries, and affiliates at any time for any reasons related directly or indirectly to using [employer]. I also agree not to take legal action against [facility] or its affiliates for any injury, death, or damage to property caused by or related to using  [employer]. I have read this Waiver and Release of Liability, and I understand the terms used in it and their legal meaning. This Waiver is freely and voluntarily given with the understanding that my right (or any legal minors) to legal action against  relating to [employer] is knowingly given up to use these services.   I attest that I read the Ride Waiver and Release of Liability to Sarah Reilly, gave [name] the opportunity to ask questions and answered the questions asked (if any). I affirm that Sarah Reilly then provided consent for assistance with transportation.     [employer]

## 2024-02-05 NOTE — Progress Notes (Addendum)
 Advanced [facility] Clinic Note  PCP: Delbert Clam, MD Endocrinology: Dr Von Primary Cardiologist: Dr Lavona  HF Cardiologist: Dr Cherrie   Chief Complaint: Chest pain and palpitations  HPI: Sarah Reilly is a 43 y.o. 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    She developed HF after the birth of her 4th child in 2010. EF 20%. Did not have a heart cath.  She then moved back to [place]  to be near her mother. Apparently EF dropped to 15-20% for a period. They suggested ICD but she refused. She returned to [place].  In July 2019 she was diagnosed with hyperthyroidism. Started on methimazole  by Dr Von.   Previously she was having CP in setting of 30 pound weight gain. Dr. Cherrie suggested R/L cath but she refused saying she wanted to try and lose weight first and see how she felt.   She was seen in [facility] 8/21 and was quite tachycardic. She had not seen Dr. Von since 12/20. Ran out of methimazole  refils. 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  TSH < 0.010. She has not followed up with Endo. On the chart there is a dismissal letter from Dr. Von dated 02/27/20.  Echo 04/02/20 EF 35-40%, RV normal. Mod-severe MR w/ tethering of posterior leaflet.    Saw Dr Lavona 11/05/21 with increased shortness of breath, orthopnea, and PND. Admitted with A/C HFrEF. TSH 0.005. Echo EF 25-30%. cMRI showed LVEF 25%, RV reduced, no LGE. No clear etiology. GDMT titrated. No Entresto  d/t intolerance, started on ACE. Discharged home, weight 210 lbs.  Echo 08/18/22: EF 20-25% RV mildly down mod-severe MR  Follow up 4/25, volume low had CP, felt to be MSK. Torsemide  changed to PRN and echo arranged. Zio placed with frequent palps.   Echo 11/03/23, EF ~ 30%. Mod-sev MR  Zio 5/25 1. Sinus rhythm  - avg HR of 75 bpm.  2. One run of nonsustained Ventricular Tachycardia occurred lasting 20 beats with a max rate of 146 bpm (avg 129 bpm). 3. PVCs were occasional (1.7%, 73718) 4. Ventricular Bigeminy and Trigeminy were present.  5. 75 patient-triggered events associated with sinus rhythm  Went to UC 01/18/24 with chest pain. Troponins negative X 2. Ddimer mildly elevated, LE venous duplex negative for DVTs. CTA chest negative for PE, RLL opacity c/w PNA vs ATX. She was prescribed course of augmentin .   Went back to UC at OSH 08/03 for recurrent symptoms. She had ECG which showed SR w/ PVCs in bigeminy pattern.  She called the clinic after discharge from [facility] for follow-up regarding CP and palpitations. Continues to have intermittent left sided chest pain. At times pain is reproducible with direct palpation. May notice some improvement with aleve or tylenol  but tries not to take it too often. Chest pain is not brought on by physical exertion. Palpitations have been chronic but come and go, seemed worse around the time she started abx for PNA.  Denies shortness of breath with exertion. No orthopnea or PND. She is compliant with medications and is tolerating them well. Undergoing workup with ENT for vertigo.   ROS: All systems negative except as listed in HPI, PMH and Problem List.  SH:  Social History   Socioeconomic History   Marital status: Single    Spouse name: Not on file  Number of children: Not on file   Years of education: Not on file   Highest education level: Not on file  Occupational History   Not on file  Tobacco Use   Smoking status: Former   Smokeless tobacco: Never  Substance and Sexual Activity   Alcohol use: Yes    Alcohol/week: 4.0 standard drinks of alcohol    Types: 3 Glasses of wine, 1 Standard drinks or equivalent per week    Comment: Occassional   Drug use: Never   Sexual activity: Not on file  Other Topics Concern   Not on file  Social History Narrative    She has 4 children ages 52 and under.  She stays at home. She does not smoke cigarettes or drink alcohol. Does not get regular exercise.    Social Drivers of Corporate investment banker Strain: High Risk (11/23/2021)   Overall Financial Resource Strain (CARDIA)    Difficulty of Paying Living Expenses: Hard  Food Insecurity: No Food Insecurity (08/26/2022)   Hunger Vital Sign    Worried About Running Out of Food in the Last Year: Never true    Ran Out of Food in the Last Year: Never true  Transportation Needs: Unmet Transportation Needs (01/19/2024)   PRAPARE - Transportation    Lack of Transportation (Medical): Yes    Lack of Transportation (Non-Medical): Yes  Physical Activity: Inactive (11/23/2021)   Exercise Vital Sign    Days of Exercise per Week: 0 days    Minutes of Exercise per Session: 0 min  Stress: Stress Concern Present (11/23/2021)   [employer] of Occupational Health - Occupational Stress Questionnaire    Feeling of Stress : To some extent  Social Connections: Unknown (03/22/2022)   Received from [facility]   Social Network    Social Network: Not on file  Intimate Partner Violence: Unknown (03/22/2022)   Received from [facility]   HITS    Physically Hurt: Not on file    Insult or Talk Down To: Not on file    Threaten Physical Harm: Not on file    Scream or Curse: Not on file   FH:  Family History  Problem Relation Age of Onset   Heart disease Mother        No details   Hypertension Mother    Diabetes Mother    Hypertension Father    Heart murmur Sister    Past Medical History:  Diagnosis Date   Cardiomyopathy    CHF (congestive heart failure) (HCC)    HTN (hypertension)    Mitral regurgitation    Current Outpatient Medications  Medication Sig Dispense Refill   acetaminophen  (TYLENOL ) 500 MG tablet Take 500-1,000 mg by mouth 4 (four) times daily as needed (for pain).     carvedilol  (COREG ) 25 MG tablet Take 1 tablet (25 mg total) by mouth 2 (two)  times daily with a meal. 180 tablet 3   digoxin  (LANOXIN ) 0.125 MG tablet Take 1 tablet (0.125 mg total) by mouth daily. 90 tablet 3   lisinopril  (ZESTRIL ) 20 MG tablet TAKE 1 TABLET (20 MG TOTAL) BY MOUTH DAILY. 30 tablet 11   methimazole  (TAPAZOLE ) 10 MG tablet Take 10 mg by mouth.     methimazole  (TAPAZOLE ) 5 MG tablet Take 5 mg by mouth daily.     Misc. Devices MISC Blood pressure monitor.  Diagnosis hypertension 1 each 0   potassium chloride  SA (KLOR-CON  M) 20 MEQ tablet Take 20 mEq by mouth 2 (two)  times a week.     spironolactone  (ALDACTONE ) 25 MG tablet Take 12.5 mg by mouth 2 (two) times a week.     torsemide  (DEMADEX ) 20 MG tablet Take 20 mg by mouth as needed.     meclizine  (ANTIVERT ) 25 MG tablet Take 1 tablet (25 mg total) by mouth 3 (three) times daily as needed for dizziness. (Patient not taking: Reported on 02/05/2024) 30 tablet 0   No current facility-administered medications for this encounter.   BP 120/84   Pulse 78   Ht 5' 7 (1.702 m)   Wt 113.8 kg (250 lb 12.8 oz)   SpO2 98%   BMI 39.28 kg/m   Wt Readings from Last 3 Encounters:  02/05/24 113.8 kg (250 lb 12.8 oz)  11/24/23 116.5 kg (256 lb 12.8 oz)  11/03/23 119.2 kg (262 lb 12.8 oz)   PHYSICAL EXAM: General:  Well appearing.  Cor: Regular rate & rhythm. 3/6 systolic murmur. Lungs: clear Abdomen: obese, soft, nontender, nondistended.  Extremities: no edema Neuro: alert & orientedx3. Affect pleasant    ECG today: SR 69 bpm    ASSESSMENT & PLAN:  Chronic Systolic Heart Failure - Onset in 2010. 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 - Echo (5/19): EF 20-25% with severe posterior MR.   - Echo (7/20): EF 35-40% mod-severe MR with restricted posterior leaflet  - cMRI (5/23): LVEF 25% RVEF 30%, No LGE. - Echo (2/24): EF 20-25% RV mildly down mod-severe MR  - Echo 11/03/23 EF ~30% mod-sev MR - NYHA II. Volume okay on exam and by ReDS which is 25%. Continue PRN  Torsemide . - Continue carvedilol  25 mg bid. - Continue lisinopril  20 mg daily (no Entresto  due to GI upset).  - Continue spironolactone  12.5 mg twice weekly - Continue digoxin  0.125 mg daily.   - No SGLT2i d/t multiple episodes of pyelonephritis in past.    - Not interested in ICD - BMET, BNP and TSH from July 2025 were reviewed - stable.   2. 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   - She was dismissed from [facility].   - Follow with Dr. Beryl, Endocrine follow up at [facility].   - Continue methimazole  15 mg daily  - Off prednisone  due to stomach upset  - Continue beta blocker.  - TSH stable in July 2025    3. Mitral regurgitation  - Moderate-severe on echo 5/23.  With severe LV and LA dilation, suspect component of functional MR but the mitral valve is also thickened with posterior leaflet restricted   - Echo 11/03/23, EF ~ 30%. Mod-sev MR - MR not improving with medical therapy - She is very hesitant to consider any invasive management/evaluation. Discussed again today. Declines referral to Structural.  4. Palpitations - Previous monitor 3/21 with NSR 90s, Brief NSVT and SVT (longest 16 seconds), rare PACs/PVCs - Zio 5/25 as in HPI. Symptoms not associated with abnormal rhythms on zio  - PVCs in bigeminy pattern on ECG 08/03. No PVCs today. 1.7% burden on monitor 5/25.  5. Chest pain - No clear cardiac source. Pain is reproducible with direct palpation of chest. - Reassurance provided  6. Obesity  - BMI 39 - Discussed referral to pharmacy for consideration of GLP1-RA. She wants try to lose the weight on her own with lifestyle measures.   Seen by HF CSW to assist with transportation home.  Follow-up: 4 months  Shelbylynn Walczyk  N, PA-C  3:34 PM

## 2024-02-05 NOTE — Patient Instructions (Addendum)
  Follow-Up in: DECEMBER AS SCHEDULED  At the [facility] Clinic, you and your health needs are our priority. We have a designated team specialized in the treatment of Heart Failure. This Care Team includes your primary Heart Failure Specialized Cardiologist (physician), Advanced Practice Providers (APPs- Physician Assistants and Nurse Practitioners), and Pharmacist who all work together to provide you with the care you need, when you need it.   You may see any of the following providers on your designated Care Team at your next follow up:  Dr. Toribio Fuel Dr. Ezra Shuck Dr. Ria Commander Dr. Odis Brownie Greig Mosses, NP Caffie Shed, [place] [facility] [place], [place] Beckey Coe, NP [place] Lee, NP Tinnie Redman, PharmD   Please be sure to bring in all your medications bottles to every appointment.   Need to Contact Us :  If you have any questions or concerns before your next appointment please send us  a message through [place] or call our office at [phone].    TO LEAVE A MESSAGE FOR THE NURSE SELECT OPTION 2, PLEASE LEAVE A MESSAGE INCLUDING: YOUR NAME DATE OF BIRTH CALL BACK NUMBER REASON FOR CALL**this is important as we prioritize the call backs  YOU WILL RECEIVE A CALL BACK THE SAME DAY AS LONG AS YOU CALL BEFORE 4:00 PM

## 2024-02-05 NOTE — Progress Notes (Signed)
 ReDS Vest / Clip - 02/05/24 1446       ReDS Vest / Clip   Station Marker D    Ruler Value 33    ReDS Value Range Low volume    ReDS Actual Value 25

## 2024-02-05 NOTE — Progress Notes (Signed)
 H&V Care Navigation CSW Progress Note  Clinical Social Worker consulted to assist with transportation home from appt- able to arrange transportation through [place] taxi.   SDOH Screenings   Food Insecurity: No Food Insecurity (08/26/2022)  Housing: Medium Risk (07/26/2022)  Transportation Needs: Unmet Transportation Needs (01/19/2024)  Utilities: At Risk (07/26/2022)  Alcohol Screen: Low Risk  (02/25/2022)  Depression (PHQ2-9): High Risk (06/02/2022)  Financial Resource Strain: High Risk (11/23/2021)  Physical Activity: Inactive (11/23/2021)  Social Connections: Unknown (03/22/2022)   Received from [facility]  Stress: Stress Concern Present (11/23/2021)  Tobacco Use: Medium Risk (02/05/2024)   Andriette HILARIO Leech, LCSW Clinical Social Worker Advanced [facility] Clinic Desk#: [phone] Cell#: [phone]

## 2024-02-07 DIAGNOSIS — Z77120 Contact with and (suspected) exposure to mold (toxic): Secondary | ICD-10-CM | POA: Diagnosis not present

## 2024-02-07 DIAGNOSIS — E0590 Thyrotoxicosis, unspecified without thyrotoxic crisis or storm: Secondary | ICD-10-CM | POA: Diagnosis not present

## 2024-02-07 DIAGNOSIS — I5020 Unspecified systolic (congestive) heart failure: Secondary | ICD-10-CM | POA: Diagnosis not present

## 2024-02-07 DIAGNOSIS — J189 Pneumonia, unspecified organism: Secondary | ICD-10-CM | POA: Diagnosis not present

## 2024-02-07 DIAGNOSIS — I110 Hypertensive heart disease with heart failure: Secondary | ICD-10-CM | POA: Diagnosis not present

## 2024-02-07 NOTE — Addendum Note (Signed)
 Encounter addended by: Buell Powell HERO, RN on: 02/07/2024 2:57 PM  Actions taken: Charge Capture section accepted

## 2024-02-28 DIAGNOSIS — Z30011 Encounter for initial prescription of contraceptive pills: Secondary | ICD-10-CM | POA: Diagnosis not present

## 2024-02-28 DIAGNOSIS — Z3046 Encounter for surveillance of implantable subdermal contraceptive: Secondary | ICD-10-CM | POA: Diagnosis not present

## 2024-03-06 DIAGNOSIS — R519 Headache, unspecified: Secondary | ICD-10-CM | POA: Diagnosis not present

## 2024-03-06 DIAGNOSIS — R42 Dizziness and giddiness: Secondary | ICD-10-CM | POA: Diagnosis not present

## 2024-03-07 ENCOUNTER — Encounter: Payer: Self-pay | Admitting: Neurology

## 2024-03-11 ENCOUNTER — Other Ambulatory Visit: Payer: Self-pay

## 2024-03-11 ENCOUNTER — Encounter (HOSPITAL_BASED_OUTPATIENT_CLINIC_OR_DEPARTMENT_OTHER): Payer: Self-pay

## 2024-03-11 ENCOUNTER — Emergency Department (HOSPITAL_BASED_OUTPATIENT_CLINIC_OR_DEPARTMENT_OTHER): Admitting: Radiology

## 2024-03-11 ENCOUNTER — Emergency Department (HOSPITAL_BASED_OUTPATIENT_CLINIC_OR_DEPARTMENT_OTHER)
Admission: EM | Admit: 2024-03-11 | Discharge: 2024-03-11 | Disposition: A | Discharge status: Home / Self Care | Attending: Emergency Medicine | Admitting: Emergency Medicine

## 2024-03-11 DIAGNOSIS — R079 Chest pain, unspecified: Secondary | ICD-10-CM | POA: Diagnosis not present

## 2024-03-11 DIAGNOSIS — I509 Heart failure, unspecified: Secondary | ICD-10-CM | POA: Diagnosis not present

## 2024-03-11 DIAGNOSIS — R42 Dizziness and giddiness: Secondary | ICD-10-CM | POA: Diagnosis not present

## 2024-03-11 DIAGNOSIS — Z91040 Latex allergy status: Secondary | ICD-10-CM | POA: Insufficient documentation

## 2024-03-11 DIAGNOSIS — R739 Hyperglycemia, unspecified: Secondary | ICD-10-CM | POA: Diagnosis not present

## 2024-03-11 DIAGNOSIS — R0789 Other chest pain: Secondary | ICD-10-CM | POA: Insufficient documentation

## 2024-03-11 DIAGNOSIS — I110 Hypertensive heart disease with heart failure: Secondary | ICD-10-CM | POA: Insufficient documentation

## 2024-03-11 DIAGNOSIS — J9811 Atelectasis: Secondary | ICD-10-CM | POA: Diagnosis not present

## 2024-03-11 DIAGNOSIS — Z79899 Other long term (current) drug therapy: Secondary | ICD-10-CM | POA: Insufficient documentation

## 2024-03-11 LAB — CBC
HCT: 39.3 % (ref 36.0–46.0)
Hemoglobin: 12.6 g/dL (ref 12.0–15.0)
MCH: 26.8 pg (ref 26.0–34.0)
MCHC: 32.1 g/dL (ref 30.0–36.0)
MCV: 83.6 fL (ref 80.0–100.0)
Platelets: 198 K/uL (ref 150–400)
RBC: 4.7 MIL/uL (ref 3.87–5.11)
RDW: 12.4 % (ref 11.5–15.5)
WBC: 6.4 K/uL (ref 4.0–10.5)
nRBC: 0 % (ref 0.0–0.2)

## 2024-03-11 LAB — BASIC METABOLIC PANEL WITH GFR
Anion gap: 13 (ref 5–15)
BUN: 10 mg/dL (ref 6–20)
CO2: 19 mmol/L — ABNORMAL LOW (ref 22–32)
Calcium: 9.6 mg/dL (ref 8.9–10.3)
Chloride: 103 mmol/L (ref 98–111)
Creatinine, Ser: 0.72 mg/dL (ref 0.44–1.00)
GFR, Estimated: >60 mL/min (ref >60)
Glucose, Bld: 124 mg/dL — ABNORMAL HIGH (ref 70–99)
Potassium: 3.8 mmol/L (ref 3.5–5.1)
Sodium: 135 mmol/L (ref 135–145)

## 2024-03-11 LAB — TROPONIN T, HIGH SENSITIVITY: Troponin T High Sensitivity: <15 ng/L (ref 0–19)

## 2024-03-11 MED ORDER — MECLIZINE HCL 25 MG PO TABS: 25.0000 mg | ORAL_TABLET | Freq: Three times a day (TID) | ORAL | 3 refills | Status: AC | PRN

## 2024-03-11 MED ORDER — MECLIZINE HCL 25 MG PO TABS
25.0000 mg | ORAL_TABLET | Freq: Once | ORAL | Status: AC
  Administered 2024-03-11: 25 mg via ORAL
  Filled 2024-03-11: qty 1

## 2024-03-11 NOTE — ED Triage Notes (Addendum)
 Pt reports dizziness, CP and vertigo Left sided CP with radiation to left arm and neck to +nausea, denies SHOB Had pneumonia in July and states it fells similar to that Pt in [facility] in triage r/t  vertigo Hx of CHF and cardiomyopathy

## 2024-03-11 NOTE — ED Notes (Signed)
 Patient called out to report feeling palpitations. PVCs noted on cardiac monitor. Strip printed and given to Raford, MD. VSS at this time.

## 2024-03-11 NOTE — Discharge Instructions (Addendum)
 Take meclizine  as often as 3 times a day as needed to help control your dizziness.  Continue to work with your ENT physician regarding vestibular physical therapy.  Also, keep your appointment with the neurologist.  Return if symptoms are getting worse.

## 2024-03-11 NOTE — ED Provider Notes (Signed)
 [facility] EM[facility] DEPARTMENT AT [facility] Provider Note   CSN: [id number] Arrival date & time: 03/11/24  9973     Patient presents with: Chest Pain   Sarah Reilly is a 43 y.o. female.   The history is provided by the patient.  Chest Pain  She has history of hypertension, cardiomyopathy with heart failure and comes in with an ongoing problems with chest pain and vertigo.  She states her chest pain is coming daily and will be a sharp pain that kind of shoots across her chest.  She was diagnosed with pneumonia in July and completed antibiotics and pain initially improved but then came back.  Vertigo is usually only when she is supine but today it was present when she was sitting up.  She has been evaluated by ENT and is being referred to neurology but states that she does not have an appointment until December.  With her vertigo, she does have nausea but has not vomited.  With her chest pain, she sometimes has some mild dyspnea and mild diaphoresis.  She denies any fever or cough.  Of note, she is not taking any medications for her vertigo.  She has seen her cardiologist who told her that her pain was noncardiac.    Prior to Admission medications   Medication Sig Start Date End Date Taking? Authorizing Provider  acetaminophen  (TYLENOL ) 500 MG tablet Take 500-1,000 mg by mouth 4 (four) times daily as needed (for pain).    [provider]  carvedilol  (COREG ) 25 MG tablet Take 1 tablet (25 mg total) by mouth 2 (two) times daily with a meal. 11/03/23   Milford, Harlene HERO, FNP  digoxin  (LANOXIN ) 0.125 MG tablet Take 1 tablet (0.125 mg total) by mouth daily. 11/03/23   [place]ford, Harlene HERO, FNP  lisinopril  (ZESTRIL ) 20 MG tablet TAKE 1 TABLET (20 MG TOTAL) BY MOUTH DAILY. 11/23/23   [name], Toribio SAUNDERS, MD  meclizine  (ANTIVERT ) 25 MG tablet Take 1 tablet (25 mg total) by mouth 3 (three) times daily as needed for dizziness. Patient not taking: Reported on 02/05/2024 01/14/24    Reddick, Johnathan B, NP  methimazole  (TAPAZOLE ) 10 MG tablet Take 10 mg by mouth. 01/03/24   [provider]  methimazole  (TAPAZOLE ) 5 MG tablet Take 5 mg by mouth daily.    [provider]  Misc. Devices MISC Blood pressure monitor.  Diagnosis hypertension 04/29/22   Delbert Clam, MD  potassium chloride  SA (KLOR-CON  M) 20 MEQ tablet Take 20 mEq by mouth 2 (two) times a week.    [provider]  spironolactone  (ALDACTONE ) 25 MG tablet Take 12.5 mg by mouth 2 (two) times a week.    [provider]  torsemide  (DEMADEX ) 20 MG tablet Take 20 mg by mouth as needed.    [provider]    Allergies: Celery oil, Shellfish allergy, Entresto  [sacubitril -valsartan ], Hydrocodone, and Latex    Review of Systems  Cardiovascular:  Positive for chest pain.  All other systems reviewed and are negative.   Updated Vital Signs BP (!) 150/83 (BP Location: Right Arm)   Pulse 80   Temp 98.3 F (36.8 C) (Oral)   Resp 17   Ht 5' 7 (1.702 m)   Wt 111.6 kg   SpO2 95%   BMI 38.53 kg/m   Physical Exam Vitals and nursing note reviewed.   43 year old female, resting comfortably and in no acute distress. Vital signs are significant for elevated blood pressure. Oxygen saturation is  95%, which is normal. Head is normocephalic and atraumatic. PERRLA, EOMI.  There is no nystagmus, but her symptoms are reproduced by lateral gaze in either direction. Neck is nontender and supple without adenopathy.  Dizziness is reproduced by passive head movement. Lungs are clear without rales, wheezes, or rhonchi. Chest is moderately tender across the lower costal region bilaterally.  There is no crepitus. Heart has regular rate and rhythm without murmur. Abdomen is soft, flat, nontender. Extremities have no cyanosis or edema, full range of motion is present. Skin is warm and dry without rash. Neurologic: Awake and alert, moves all extremities equally.  (all labs ordered are  listed, but only abnormal results are displayed) Labs Reviewed  BASIC METABOLIC PANEL WITH GFR - Abnormal; Notable for the following components:      Result Value   CO2 19 (*)    Glucose, Bld 124 (*)    All other components within normal limits  CBC  PREGNANCY, URINE  TROPONIN T, HIGH SENSITIVITY    EKG: EKG Interpretation Date/Time:  Monday March 11 2024 00:36:27 EDT Ventricular Rate:  81 PR Interval:  180 QRS Duration:  88 QT Interval:  334 QTC Calculation: 387 R Axis:   44  Text Interpretation: Normal sinus rhythm Possible Left atrial enlargement ST & T wave abnormality, consider inferior ischemia ST & T wave abnormality, consider anterolateral ischemia Abnormal ECG When compared with ECG of 05-Feb-2024 15:50, No significant change was found Confirmed by Raford Lenis ([id number]) on 03/11/2024 12:42:08 AM  Radiology: DG Chest 2 View Result Date: 03/11/2024 CLINICAL DATA:  cp EXAM: CHEST - 2 VIEW COMPARISON:  Chest x-ray 01/18/2024, CT chest 01/18/2024 FINDINGS: The heart and mediastinal contours are within normal limits. Right base atelectasis. No focal consolidation. No pulmonary edema. No pleural effusion. No pneumothorax. No acute osseous abnormality. IMPRESSION: No active cardiopulmonary disease. Electronically Signed   By: Morgane  Naveau M.D.   On: 03/11/2024 00:59     Procedures  Cardiac monitor shows normal sinus rhythm, per my interpretation. Medications Ordered in the ED  meclizine  (ANTIVERT ) tablet 25 mg (has no administration in time range)                                    Medical Decision Making Amount and/or Complexity of Data Reviewed Labs: ordered. Radiology: ordered.   Dizziness and chest pain which are chronic but dizziness is worse tonight.  Exam is strongly suggestive of benign positional vertigo, and I have ordered a dose of meclizine .  I have reviewed her electrocardiogram, and my interpretation is nonspecific ST and T wave changes which are  unchanged from prior.  Chest x-ray shows no evidence of pneumonia.  I have independently viewed the images, and agree with the radiologist's interpretation.  I reviewed her laboratory tests, my interpretation is elevated random glucose level, slightly low CO2 with normal anion gap, normal CBC, normal troponin.  Symptoms are chronic, second troponin is not needed.  I have reviewed her past records and note office visit for heart failure on 02/05/2024 at which time chest pain was felt to be noncardiac, ENT office visit on 9/10 where there was some concern that her vertigo could be related to migraines but she had normal audiology testing.  She had good but not complete relief of symptoms with meclizine .  I am discharging her with a prescription for meclizine .  I have encouraged her to continue to follow-up with  her ENT physician and to keep her scheduled appointment with neurology.     Final diagnoses:  Dizziness  Non-cardiac chest pain  Elevated random blood glucose level    ED Discharge Orders          Ordered    meclizine  (ANTIVERT ) 25 MG tablet  3 times daily PRN        03/11/24 0326               Raford Lenis, MD 03/11/24 [id number]

## 2024-03-20 ENCOUNTER — Telehealth (HOSPITAL_COMMUNITY): Payer: Self-pay

## 2024-03-20 ENCOUNTER — Telehealth (HOSPITAL_COMMUNITY): Payer: Self-pay | Admitting: Cardiology

## 2024-03-20 NOTE — Telephone Encounter (Signed)
 WALK IN Patient presents to office to request an appt Reports increase chest pain, pains increased with movement. Reports she has been seen in ER multiple times since July. ER visits are always negative  Pt repots no SOB or palps Just sharp pains at times  Reports she has pneumonia that was thought to be the cause, seeing ENT for vertigo and referral to PT   Advised chest pains of any nature should be evaluated in ER  Pt requested add on appt to discuss further- add on 9/25 @ 3- pt was again advised to not wait until appt to be evaluated, should report to ER   Pt also advised to contact PCP for add on for further evaluation of chest pains non cardiac in nature   Message to provider as FYI/further input

## 2024-03-20 NOTE — Telephone Encounter (Signed)
 Called to confirm/remind patient of their appointment at the [facility] on 03/21/24.   Appointment:   [x] Confirmed  [] Left mess   [] No answer/No voice mail  [] VM Full/unable to leave message  [] Phone not in service  Patient reminded to bring all medications and/or complete list.  Confirmed patient has transportation. Gave directions, instructed to utilize valet parking.

## 2024-03-21 ENCOUNTER — Encounter (HOSPITAL_COMMUNITY): Payer: Self-pay

## 2024-03-21 ENCOUNTER — Ambulatory Visit (HOSPITAL_COMMUNITY)
Admission: RE | Admit: 2024-03-21 | Discharge: 2024-03-21 | Disposition: A | Discharge status: Home / Self Care | Source: Ambulatory Visit | Attending: Family Medicine | Admitting: Family Medicine

## 2024-03-21 VITALS — BP 118/86 | HR 81 | Wt 248.6 lb

## 2024-03-21 DIAGNOSIS — F419 Anxiety disorder, unspecified: Secondary | ICD-10-CM | POA: Insufficient documentation

## 2024-03-21 DIAGNOSIS — R079 Chest pain, unspecified: Secondary | ICD-10-CM | POA: Diagnosis not present

## 2024-03-21 DIAGNOSIS — I340 Nonrheumatic mitral (valve) insufficiency: Secondary | ICD-10-CM | POA: Insufficient documentation

## 2024-03-21 DIAGNOSIS — Z87891 Personal history of nicotine dependence: Secondary | ICD-10-CM | POA: Diagnosis not present

## 2024-03-21 DIAGNOSIS — Z6838 Body mass index (BMI) 38.0-38.9, adult: Secondary | ICD-10-CM | POA: Insufficient documentation

## 2024-03-21 DIAGNOSIS — I110 Hypertensive heart disease with heart failure: Secondary | ICD-10-CM | POA: Insufficient documentation

## 2024-03-21 DIAGNOSIS — E669 Obesity, unspecified: Secondary | ICD-10-CM | POA: Insufficient documentation

## 2024-03-21 DIAGNOSIS — I5022 Chronic systolic (congestive) heart failure: Secondary | ICD-10-CM | POA: Insufficient documentation

## 2024-03-21 DIAGNOSIS — F4000 Agoraphobia, unspecified: Secondary | ICD-10-CM | POA: Diagnosis not present

## 2024-03-21 DIAGNOSIS — E0590 Thyrotoxicosis, unspecified without thyrotoxic crisis or storm: Secondary | ICD-10-CM | POA: Insufficient documentation

## 2024-03-21 DIAGNOSIS — I428 Other cardiomyopathies: Secondary | ICD-10-CM | POA: Diagnosis not present

## 2024-03-21 DIAGNOSIS — R200 Anesthesia of skin: Secondary | ICD-10-CM | POA: Diagnosis not present

## 2024-03-21 DIAGNOSIS — I10 Essential (primary) hypertension: Secondary | ICD-10-CM | POA: Diagnosis not present

## 2024-03-21 DIAGNOSIS — Z79899 Other long term (current) drug therapy: Secondary | ICD-10-CM | POA: Insufficient documentation

## 2024-03-21 DIAGNOSIS — R002 Palpitations: Secondary | ICD-10-CM | POA: Diagnosis not present

## 2024-03-21 LAB — T4, FREE: Free T4: 0.67 ng/dL (ref 0.61–1.12)

## 2024-03-21 LAB — SEDIMENTATION RATE: Sed Rate: 9 mm/h (ref 0–22)

## 2024-03-21 LAB — C-REACTIVE PROTEIN: CRP: 0.5 mg/dL (ref <1.0)

## 2024-03-21 NOTE — Progress Notes (Signed)
 [facility]   PCP: Dr. Murleen Endocrinology: Dr Von Primary Cardiologist: Dr Lavona  HF Cardiologist: Dr Cherrie   HPI: Sarah Reilly is a 43 y.o. 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    She developed HF after the birth of her 4th child in 2010. EF 20%. Did not have a heart cath.  She then moved back to [place]  to be near her mother. Apparently EF dropped to 15-20% for a period. They suggested ICD but she refused. She returned to [place].  In July 2019 she was diagnosed with hyperthyroidism. Started on methimazole  by Dr Von.   Previously she was having CP in setting of 30 pound weight gain. Dr. Cherrie suggested R/L cath but she refused saying she wanted to try and lose weight first and see how she felt.   She was seen in [facility] 8/21 and was quite tachycardic. She had not seen Dr. Von since 12/20. Ran out of methimazole  refils. 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  TSH < 0.010. She has not followed up with Endo. On the chart there is a dismissal letter from Dr. Von dated 02/27/20.  Echo 04/02/20 EF 35-40%, RV normal. Mod-severe MR w/ tethering of posterior leaflet.    Saw Dr Lavona 11/05/21 with increased shortness of breath, orthopnea, and PND. Admitted with A/C HFrEF. TSH 0.005. Echo EF 25-30%. cMRI showed LVEF 25%, RV reduced, no LGE. No clear etiology. GDMT titrated. No Entresto  d/t intolerance, started on ACE. Discharged home, weight 210 lbs.  Echo 08/18/22: EF 20-25% RV mildly down mod-severe MR  Follow up 4/25, volume low had CP, felt to be MSK. Torsemide  changed to PRN and echo arranged. Zio placed with frequent palps.   Echo 11/03/23, EF ~ 30%. Mod-sev MR  Zio 5/25 1. Sinus rhythm - avg HR of 75 bpm.  2. One run of nonsustained  Ventricular Tachycardia occurred lasting 20 beats with a max rate of 146 bpm (avg 129 bpm). 3. PVCs were occasional (1.7%, 73718) 4. Ventricular Bigeminy and Trigeminy were present.  5. 75 patient-triggered events associated with sinus rhythm  Went to UC 01/18/24 with chest pain. Troponins negative X 2. Ddimer mildly elevated, LE venous duplex negative for DVTs. CTA chest negative for PE, RLL opacity c/w PNA vs ATX. She was prescribed course of augmentin .   Went back to UC at OSH 01/28/24 for recurrent symptoms. She had ECG which showed SR w/ PVCs in bigeminy pattern.  Seen in ED 03/11/24 with dizziness and CP. HsToponin negative and ECG stable. Symptoms resolved after receiving meclizine , and she was discharged.  Today she presents for an acute visit with multiple complaints, CP and face/arm numbness are main issues. She had PNA this summer, feels CP related to this. Pain is left upper chest, feels like a muscle strain. Pain is worse when she moves, resolves when lying flat. Symptoms on-going since May, but progressive. Occasional SOB with ADLs and walking on flat ground, not every day. Continues with chronic dizziness, has meclizine  with some improvement in symptoms. Has seen ENT, who has referred her to Neurology. Denies palpitations, abnormal bleeding, edema, or PND/Orthopnea. Appetite ok. Weight at home 244 pounds. Taking all medications. Current symptoms are worsening her anxiety.  ROS: All systems negative except as listed in HPI, PMH and Problem List.  SH:  Social History   Socioeconomic History   Marital status: Single    Spouse name: Not on file   Number of children: Not on file   Years of education: Not on file   Highest education level: Not on file  Occupational History   Not on file  Tobacco Use   Smoking status: Former   Smokeless tobacco: Never  Vaping Use   Vaping status: Never Used  Substance and Sexual Activity   Alcohol use: Yes    Alcohol/week: 4.0 standard drinks  of alcohol    Types: 3 Glasses of wine, 1 Standard drinks or equivalent per week    Comment: Occassional   Drug use: Never   Sexual activity: Not on file  Other Topics Concern   Not on file  Social History Narrative   She has 4 children ages 48 and under.  She stays at home. She does not smoke cigarettes or drink alcohol. Does not get regular exercise.    Social Drivers of Corporate investment banker Strain: High Risk (11/23/2021)   Overall Financial Resource Strain (CARDIA)    Difficulty of Paying Living Expenses: Hard  Food Insecurity: No Food Insecurity (08/26/2022)   Hunger Vital Sign    Worried About Running Out of Food in the Last Year: Never true    Ran Out of Food in the Last Year: Never true  Transportation Needs: Unmet Transportation Needs (01/19/2024)   PRAPARE - Transportation    Lack of Transportation (Medical): Yes    Lack of Transportation (Non-Medical): Yes  Physical Activity: Inactive (11/23/2021)   Exercise Vital Sign    Days of Exercise per Week: 0 days    Minutes of Exercise per Session: 0 min  Stress: Stress Concern Present (11/23/2021)   [employer] of Occupational Health - Occupational Stress Questionnaire    Feeling of Stress : To some extent  Social Connections: Unknown (03/22/2022)   Received from [facility]   Social Network    Social Network: Not on file  Intimate Partner Violence: Unknown (03/22/2022)   Received from [facility]   HITS    Physically Hurt: Not on file    Insult or Talk Down To: Not on file    Threaten Physical Harm: Not on file    Scream or Curse: Not on file   FH:  Family History  Problem Relation Age of Onset   Heart disease Mother        No details   Hypertension Mother    Diabetes Mother    Hypertension Father    Heart murmur Sister    Past Medical History:  Diagnosis Date   Cardiomyopathy    CHF (congestive heart failure) (HCC)    HTN (hypertension)    Mitral regurgitation    Current Outpatient Medications   Medication Sig Dispense Refill   acetaminophen  (TYLENOL ) 500 MG tablet Take 500-1,000 mg by mouth 4 (four) times daily as needed (for pain).     carvedilol  (COREG ) 25 MG tablet Take 1 tablet (25 mg total) by mouth 2 (two) times daily with a meal. 180 tablet 3   digoxin  (LANOXIN ) 0.125 MG tablet Take 1 tablet (0.125 mg total) by mouth daily. 90 tablet 3   lisinopril  (ZESTRIL ) 20 MG tablet TAKE 1 TABLET (20 MG TOTAL) BY MOUTH DAILY. 30 tablet 11   meclizine  (ANTIVERT ) 25 MG tablet Take 1 tablet (25 mg total) by mouth 3 (three) times daily as needed for dizziness. 30 tablet 3   methimazole  (TAPAZOLE )  10 MG tablet Take 10 mg by mouth.     methimazole  (TAPAZOLE ) 5 MG tablet Take 5 mg by mouth daily.     potassium chloride  SA (KLOR-CON  M) 20 MEQ tablet Take 20 mEq by mouth 2 (two) times a week.     spironolactone  (ALDACTONE ) 25 MG tablet Take 12.5 mg by mouth 2 (two) times a week.     torsemide  (DEMADEX ) 20 MG tablet Take 20 mg by mouth as needed.     Misc. Devices MISC Blood pressure monitor.  Diagnosis hypertension (Patient not taking: Reported on 03/21/2024) 1 each 0   No current facility-administered medications for this encounter.   BP 118/86   Pulse 81   Wt 112.8 kg (248 lb 9.6 oz)   SpO2 99%   BMI 38.94 kg/m   Wt Readings from Last 3 Encounters:  03/21/24 112.8 kg (248 lb 9.6 oz)  03/11/24 111.6 kg (246 lb)  02/05/24 113.8 kg (250 lb 12.8 oz)   PHYSICAL EXAM: General:  NAD. No resp difficulty, walked into clinic HEENT: Normal Neck: Supple. No JVD. Thick neck Cor: Regular rate & rhythm. No rubs, gallops or murmurs. Left upper chest TTP Lungs: Clear Abdomen: Soft, obese, nontender, nondistended.  Extremities: No cyanosis, clubbing, rash, edema Neuro: Alert & oriented x 3, moves all 4 extremities w/o difficulty. Affect pleasant.  ECG (personally reviewed): SR with PACs, no acute ST-T changes  ReDs reading: 29 %, normal  ASSESSMENT & PLAN: Chest Pain - Mostly atypical  features. Several ED visits, work up has been re-assuring. Symptoms causing great anxiety - ECG today without acute changes, volume stable. - Chest is TTP today, likely MSK vs pleuritic source - No LGE on cMRI in 2023 - Will arrange coronary CTA to rule out obs disease - Update echo - Check ESR and CRP - Refer back to General Cardiology, patient of Dr. Lavona. May need formal ischemic eval  Chronic Systolic Heart Failure - Onset in 2010. 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 - Echo (5/19): EF 20-25% with severe posterior MR.   - Echo (7/20): EF 35-40% mod-severe MR with restricted posterior leaflet  - cMRI (5/23): LVEF 25% RVEF 30%, No LGE. - Echo (2/24): EF 20-25% RV mildly down mod-severe MR  - Echo 11/03/23: EF ~30% mod-sev MR - NYHA II. Volume okay on exam, ReDs normal at 29% - Continue torsemide  20 mg PRN. - Continue carvedilol  25 mg bid. - Continue lisinopril  20 mg daily (no Entresto  due to GI upset).  - Continue spironolactone  12.5 mg MWF - Continue digoxin  0.125 mg daily.   - No SGLT2i d/t multiple episodes of pyelonephritis in past.    - Not interested in ICD - Labs reviewed from 03/11/24 and are stable; K 3.8, creatinine 0.72 - Check dig level today - update echo - Given Rx for BP cuff   3. 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   - She was dismissed from [facility].   - Follow with Dr. Beryl, Endocrine follow up at [facility].   - Continue methimazole  15 mg daily  - Off prednisone  due to stomach upset  - Continue beta blocker.  - She is requesting TFTs, will arrange and forward to Dr. Beryl   4. Mitral regurgitation  - Moderate-severe on echo 5/23.  With severe LV and LA dilation, suspect component of functional MR but the mitral valve is also thickened with posterior leaflet  restricted   - Echo 11/03/23, EF ~ 30%. Mod-sev MR - MR not  improving with medical therapy - She is very hesitant to consider any invasive management/evaluation.  - She has not been interested in referral to Structural Heart - update echo  5. Palpitations - Previous monitor 3/21 with NSR 90s, Brief NSVT and SVT (longest 16 seconds), rare PACs/PVCs - Zio 5/25 as in HPI. Symptoms not associated with abnormal rhythms on zio  - 1.7% burden on monitor 5/25.  6. Obesity  - Body mass index is 38.94 kg/m. - Would benefit from GLP1  7. Anxiety - significant surrounding her medical issues - Recommended PCP follow up for Rx +/- counseling  Follow up in 6 months with Dr. Cherrie  Harlene CHRISTELLA Gainer, FNP  3:16 PM

## 2024-03-21 NOTE — Progress Notes (Signed)
 ReDS Vest / Clip - 03/21/24 1600       ReDS Vest / Clip   Station Marker C    Ruler Value 31    ReDS Value Range Low volume    ReDS Actual Value 29

## 2024-03-21 NOTE — Addendum Note (Signed)
 Encounter addended by: Dante Jeannine HERO, CMA on: 03/21/2024 4:33 PM  Actions taken: Flowsheet accepted, Clinical Note Signed, Charge Capture section accepted

## 2024-03-21 NOTE — Patient Instructions (Addendum)
 There has been no changes to your medications.  Labs done today, your results will be available in MyChart, we will contact you for abnormal readings.  You have been referred back to Dr. Lavona. His office will call you to arrange your appointment.  Check your blood pressure daily, 45 minutes after you take your medications.  Your physician has requested that you have an echocardiogram. Echocardiography is a painless test that uses sound waves to create images of your heart. It provides your doctor with information about the size and shape of your heart and how well your heart's chambers and valves are working. This procedure takes approximately one hour. There are no restrictions for this procedure. Please do NOT wear cologne, perfume, aftershave, or lotions (deodorant is allowed). Please arrive 15 minutes prior to your appointment time.  Please note: We ask at that you not bring children with you during ultrasound (echo/ vascular) testing. Due to room size and safety concerns, children are not allowed in the ultrasound rooms during exams. Our front office staff cannot provide observation of children in our lobby area while testing is being conducted. An adult accompanying a patient to their appointment will only be allowed in the ultrasound room at the discretion of the ultrasound technician under special circumstances. We apologize for any inconvenience.    Your cardiac CT will be scheduled at one of the below locations:   [facility] [address]  [place], [place] [address] [phone]  scheduled at the Heart [facility] at [address], please enter the parking lot using the [address] entrance and use the FREE valet service at the patient drop-off area. Enter the building and check-in with registration on the main floor.  Please follow these instructions carefully (unless otherwise directed):  An IV will be required for this test and  Nitroglycerin  will be given.  Hold all erectile dysfunction medications at least 3 days (72 hrs) prior to test. (Ie viagra, cialis, sildenafil, tadalafil, etc)   On the Night Before the Test: Be sure to Drink plenty of water. Do not consume any caffeinated/decaffeinated beverages or chocolate 12 hours prior to your test. Do not take any antihistamines 12 hours prior to your test.  If the patient has contrast allergy: Patient will need a prescription for Prednisone  and very clear instructions (as follows): Prednisone  50 mg - take 13 hours prior to test Take another Prednisone  50 mg 7 hours prior to test Take another Prednisone  50 mg 1 hour prior to test Take Benadryl 50 mg 1 hour prior to test Patient must complete all four doses of above prophylactic medications. Patient will need a ride after test due to Benadryl.  On the Day of the Test: Drink plenty of water until 1 hour prior to the test. Do not eat any food 1 hour prior to test. You may take your regular medications prior to the test.  Take metoprolol  (Lopressor ) two hours prior to test. If you take Furosemide /Hydrochlorothiazide/Spironolactone /Chlorthalidone, please HOLD on the morning of the test. Patients who wear a continuous glucose monitor MUST remove the device prior to scanning. FEMALES- please wear underwire-free bra if available, avoid dresses & tight clothing  After the Test: Drink plenty of water. After receiving IV contrast, you may experience a mild flushed feeling. This is normal. On occasion, you may experience a mild rash up to 24 hours after the test. This is not dangerous. If this occurs, you can take Benadryl 25 mg, Zyrtec,  Claritin, or Allegra and increase your fluid intake. (Patients taking Tikosyn should avoid Benadryl, and may take Zyrtec, Claritin, or Allegra) If you experience trouble breathing, this can be serious. If it is severe call 911 IMMEDIATELY. If it is mild, please call our office.  We will  call to schedule your test 2-4 weeks out understanding that some insurance companies will need an authorization prior to the service being performed.   For more information and frequently asked questions, please visit our website : [email or web address]  For non-scheduling related questions, please contact the cardiac imaging nurse navigator should you have any questions/concerns: Cardiac Imaging Nurse Navigators Direct Office Dial : [phone]   For scheduling needs, including cancellations and rescheduling, please call [place], [phone].  Your physician recommends that you schedule a follow-up appointment in: 6 months ( March 2026) ** PLEASE CALL THE OFFICE IN [place] TO ARRANGE YOUR FOLLOW UP APPOINTMENT.**  If you have any questions or concerns before your next appointment please send us  a message through [place] or call our office at [phone].    TO LEAVE A MESSAGE FOR THE NURSE SELECT OPTION 2, PLEASE LEAVE A MESSAGE INCLUDING: YOUR NAME DATE OF BIRTH CALL BACK NUMBER REASON FOR CALL**this is important as we prioritize the call backs  YOU WILL RECEIVE A CALL BACK THE SAME DAY AS LONG AS YOU CALL BEFORE 4:00 PM  At the [facility], you and your health needs are our priority. As part of our continuing mission to provide you with exceptional heart care, we have created designated Provider Care Teams. These Care Teams include your primary Cardiologist (physician) and Advanced Practice Providers (APPs- Physician Assistants and Nurse Practitioners) who all work together to provide you with the care you need, when you need it.   You may see any of the following providers on your designated Care Team at your next follow up: Dr Toribio Fuel Dr Ezra Shuck Dr. Ria Commander Dr. Morene Brownie Amy Lenetta, NP Caffie Shed, [place] [facility], [place] Beckey Coe, NP [place] Lee, NP Ellouise Class, NP Tinnie Redman, PharmD Jaun Bash, PharmD   Please be sure to bring in all your medications bottles to every appointment.    Thank you for choosing [facility]

## 2024-03-22 ENCOUNTER — Ambulatory Visit (HOSPITAL_COMMUNITY): Payer: Self-pay | Admitting: Family Medicine

## 2024-03-22 ENCOUNTER — Ambulatory Visit: Attending: Otolaryngology

## 2024-03-22 ENCOUNTER — Ambulatory Visit

## 2024-03-22 ENCOUNTER — Other Ambulatory Visit (HOSPITAL_COMMUNITY): Payer: Self-pay | Admitting: Family Medicine

## 2024-03-22 VITALS — BP 102/74 | HR 74

## 2024-03-22 DIAGNOSIS — R42 Dizziness and giddiness: Secondary | ICD-10-CM | POA: Diagnosis present

## 2024-03-22 DIAGNOSIS — R2681 Unsteadiness on feet: Secondary | ICD-10-CM | POA: Insufficient documentation

## 2024-03-22 NOTE — Therapy (Signed)
 OUTPATIENT PHYSICAL THERAPY VESTIBULAR EVALUATION     Patient Name: Sarah Reilly MRN: [id number] DOB:11/10/1980, 43 y.o., female Today's Date: 03/22/2024  END OF SESSION:  PT End of Session - 03/22/24 1149     Visit Number 1    Number of Visits 9    Date for Recertification  04/19/24    Authorization Type [place] medicaid- [employer]    PT Start Time 1147    PT Stop Time 1230    PT Time Calculation (min) 43 min    Activity Tolerance Patient tolerated treatment well    Behavior During Therapy WFL for tasks assessed/performed          Past Medical History:  Diagnosis Date   Cardiomyopathy    CHF (congestive heart failure) (HCC)    HTN (hypertension)    Mitral regurgitation    History reviewed. No pertinent surgical history. 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   SOB (shortness of breath) 11/03/2017   Severe mitral regurgitation 07/20/2009   CHEST PAIN 07/20/2009   Essential hypertension 06/16/2009   Agoraphobia 04/22/2009   Migraine headache 04/22/2009   Pyelonephritis 04/22/2009   SYNCOPE 04/22/2009   DIZZINESS 04/22/2009   PALPITATIONS 04/22/2009   ELECTROCARDIOGRAM, ABNORMAL 04/22/2009    PCP: non reported REFERRING PROVIDER: Vaughan Ricker, MD  REFERRING DIAG: R42 (ICD-10-CM) - Vertigo   THERAPY DIAG:  Unsteadiness on feet - Plan: PT plan of care cert/re-cert  Dizziness and giddiness - Plan: PT plan of care cert/re-cert  ONSET DATE: 03/07/24 referral   Rationale for Evaluation and Treatment:  Rehabilitation  SUBJECTIVE:   SUBJECTIVE STATEMENT: Patient arrives to clinic alone, no AD. Roughly a year ago, she was sitting on her bed watching TV and all of a sudden the room was spinning. She also experienced chest pain at the time and went to the ER. Per patient, she was never seen, but saw that her sodium was low, so ate [place] fries and felt better. Patient eventually saw ENT in July and was given Meclizine , but that made her dizziness. Per patient, ENT was not able to do vestibular testing due to having palpitations; so they sent her to PT because PT can treat her better and help her if she get's sick.  Pt accompanied by: self  PERTINENT HISTORY: cardiomyopathy, CHF, HTN, mitral regurgitation, sciatica, h/o migraines  PAIN:  Are you having pain? Yes: NPRS scale: 5-6/10 Pain location: L side of chest (non cardiogenic)  Pain description: like a strain, intermittent sharp/shooting Aggravating factors: with movement Relieving factors: resting in certain positions  PRECAUTIONS: Fall  RED FLAGS: None   WEIGHT BEARING RESTRICTIONS: No  FALLS: Has patient fallen in last 6 months? No  LIVING ENVIRONMENT: Lives with: lives with their family Lives in: House/apartment Stairs: Yes: External: 4 steps; on right going up Has following equipment at home: None  PLOF: Needs assistance with homemaking and going up/down stairs  PATIENT GOALS: understand why I'm having vertigo/dizziness and how to get rid of it  OBJECTIVE:  Note: Objective measures were completed at Evaluation unless otherwise noted.  DIAGNOSTIC FINDINGS: 01/18/24 CT  angio chest  IMPRESSION: 1. No evidence of pulmonary embolism. 2. Mild patchy right lower lobe opacity, favoring pneumonia over atelectasis. 3. Trace bilateral pleural effusions.  COGNITION: Overall cognitive status: Within functional limits for tasks assessed   SENSATION: WFL   POSTURE:  rounded shoulders and forward head  Cervical ROM:    WFL, pain free  STRENGTH: WFL   BED MOBILITY:  Independent; endorses dizziness with rolling L and sometimes laying down   VESTIBULAR ASSESSMENT:  GENERAL OBSERVATION: NAD, no AD   SYMPTOM BEHAVIOR:  Subjective history: see above  Non-Vestibular symptoms: changes in hearing and changes in vision; dampened hearing and blurry vision  Type of dizziness: Spinning/Vertigo and Funny feeling in the head  Frequency: intermittent  Duration: ~1 min  Aggravating factors: Induced by position change: lying supine and rolling to the left and Worse outside or in busy environment  Relieving factors: closing eyes and rest  Progression of symptoms: unchanged  OCULOMOTOR EXAM:  Ocular Alignment: TBA  Ocular ROM: TBA  Spontaneous Nystagmus: TBA  Gaze-Induced Nystagmus: TBA  Smooth Pursuits: TBA  Saccades: TBA  Convergence/Divergence: TBA  VESTIBULAR - OCULAR REFLEX:  TBA   POSITIONAL TESTING: Right Dix-Hallpike: no nystagmus Left Dix-Hallpike: upbeating, left nystagmus Right Roll Test: no nystagmus Left Roll Test: no nystagmus  MOTION SENSITIVITY:  Motion Sensitivity Quotient Intensity: 0 = none, 1 = Lightheaded, 2 = Mild, 3 = Moderate, 4 = Severe, 5 = Vomiting  Intensity  1. Sitting to supine   2. Supine to L side   3. Supine to R side   4. Supine to sitting   5. L Hallpike-Dix   6. Up from L    7. R Hallpike-Dix   8. Up from R    9. Sitting, head tipped to L knee   10. Head up from L knee   11. Sitting, head tipped to R knee   12. Head up from R knee   13. Sitting head turns x5   14.Sitting head nods x5   15. In stance, 180 turn to L    16. In stance, 180 turn to R     Vitals:   03/22/24 1214  BP: 102/74  Pulse: 74                                                                                                                            TREATMENT   Canalith Repositioning:  Epley Left: Number of Reps: 1 and Response to Treatment: symptoms improved  Self  care/home management:  -pathophys of BPPV -ddx  PATIENT EDUCATION: Education details: PTPOC, exam findings, see above Person educated: Patient Education method: Explanation and Demonstration Education comprehension: verbalized understanding and needs further education  HOME EXERCISE PROGRAM: To be provided GOALS: Goals reviewed with patient? Yes  SHORT TERM GOALS: = LTG based on PT POC length   LONG TERM GOALS: Target date: 04/19/24  Pt will be independent with final HEP for improved for symptom  report  Baseline: to be provided Goal status: INITIAL  2.  Patient will demonstrate (-) positional testing to indicate resolution of BPPV  Baseline: L posterior canal canalithiasis  Goal status: INITIAL   ASSESSMENT:  CLINICAL IMPRESSION: Patient is a 43 y.o. female who was seen today for physical therapy evaluation and treatment for dizziness. She has a complicated cardiac history, likely compounding her experience of dizziness. She describes an initial, significant vertiginous episode rather unprovoked as she was sitting up in bed. PT questioning involvement of neuritis vs labyrinthitis. She does also endorse positional spinning-dizziness with rolling L in bed. She had a positive L dix hallpike indicative of L posterior canal canalithiasis. She presented with very low amplitude torsional upbeating L nystagmus with delayed onset of ~5s, lasting ~30s. This was treated with Epley x1, which patient tolerated well. She would benefit from skilled PT services to address the above mentioned deficits.    OBJECTIVE IMPAIRMENTS: decreased knowledge of condition and dizziness.   ACTIVITY LIMITATIONS: bed mobility, reach over head, hygiene/grooming, locomotion level, and caring for others  PARTICIPATION LIMITATIONS: meal prep, cleaning, interpersonal relationship, driving, shopping, community activity, and occupation  PERSONAL FACTORS: Age, Fitness, Past/current experiences, Sex, Social  background, Time since onset of injury/illness/exacerbation, Transportation, and 3+ comorbidities: see above are also affecting patient's functional outcome.   REHAB POTENTIAL: Good  CLINICAL DECISION MAKING: Unstable/unpredictable  EVALUATION COMPLEXITY: High   PLAN:  PT FREQUENCY: 2x/week  PT DURATION: 4 weeks  PLANNED INTERVENTIONS: 97164- PT Re-evaluation, 97750- Physical Performance Testing, 97110-Therapeutic exercises, 97530- Therapeutic activity, [id number]- Neuromuscular re-education, 97535- Self Care, [id number]- Manual therapy, [id number]- Gait training, [phone]- Orthotic Initial, [phone]- Orthotic/Prosthetic subsequent, [phone]- Canalith repositioning, [id number]- Aquatic Therapy, [phone] (1-2 muscles), [id number] (3+ muscles)- Dry Needling, Patient/Family education, Balance training, Stair training, Vestibular training, Visual/preceptual remediation/compensation, Cognitive remediation, and DME instructions  PLAN FOR NEXT SESSION: re-check L BPPV, brandt daroff. If resolved- check VOR and prescribe HEP PRN    Delon DELENA Pop, PT Delon DELENA Pop, PT, DPT, CBIS  03/22/2024, 12:54 PM

## 2024-03-25 ENCOUNTER — Ambulatory Visit (HOSPITAL_COMMUNITY)
Admission: RE | Admit: 2024-03-25 | Discharge: 2024-03-25 | Disposition: A | Source: Ambulatory Visit | Attending: Family Medicine | Admitting: Family Medicine

## 2024-03-25 DIAGNOSIS — I5022 Chronic systolic (congestive) heart failure: Secondary | ICD-10-CM | POA: Insufficient documentation

## 2024-03-25 DIAGNOSIS — I340 Nonrheumatic mitral (valve) insufficiency: Secondary | ICD-10-CM | POA: Insufficient documentation

## 2024-03-25 LAB — ECHOCARDIOGRAM COMPLETE
Area-P 1/2: 3.36 cm2
Calc EF: 40.9 %
Est EF: 40
MV M vel: 5.02 m/s
MV Peak grad: 100.8 mmHg
Radius: 0.6 cm
S' Lateral: 5.8 cm
Single Plane A2C EF: 46.8 %
Single Plane A4C EF: 42.7 %

## 2024-03-25 NOTE — Progress Notes (Signed)
  Echocardiogram 2D Echocardiogram has been performed.  Sarah Reilly 03/25/2024, 3:48 PM

## 2024-03-27 ENCOUNTER — Encounter (HOSPITAL_COMMUNITY): Payer: Self-pay

## 2024-03-29 ENCOUNTER — Ambulatory Visit (HOSPITAL_COMMUNITY)
Admission: RE | Admit: 2024-03-29 | Discharge: 2024-03-29 | Disposition: A | Discharge status: Home / Self Care | Source: Ambulatory Visit | Attending: Family Medicine | Admitting: Family Medicine

## 2024-03-29 DIAGNOSIS — E0590 Thyrotoxicosis, unspecified without thyrotoxic crisis or storm: Secondary | ICD-10-CM | POA: Diagnosis present

## 2024-03-29 MED ORDER — NITROGLYCERIN 0.4 MG SL SUBL
0.8000 mg | SUBLINGUAL_TABLET | Freq: Once | SUBLINGUAL | Status: AC
  Administered 2024-03-29: 0.8 mg via SUBLINGUAL

## 2024-03-29 MED ORDER — IOHEXOL 350 MG/ML SOLN
100.0000 mL | Freq: Once | INTRAVENOUS | Status: AC | PRN
  Administered 2024-03-29: 100 mL via INTRAVENOUS

## 2024-04-01 ENCOUNTER — Encounter

## 2024-04-03 ENCOUNTER — Encounter (HOSPITAL_COMMUNITY): Payer: Self-pay

## 2024-04-05 ENCOUNTER — Ambulatory Visit: Attending: Otolaryngology

## 2024-04-05 ENCOUNTER — Ambulatory Visit (HOSPITAL_COMMUNITY)
Admission: RE | Admit: 2024-04-05 | Discharge: 2024-04-05 | Disposition: A | Discharge status: Home / Self Care | Source: Ambulatory Visit | Attending: Family Medicine | Admitting: Family Medicine

## 2024-04-05 ENCOUNTER — Other Ambulatory Visit (HOSPITAL_COMMUNITY): Payer: Self-pay | Admitting: Family Medicine

## 2024-04-05 VITALS — BP 135/101 | HR 84

## 2024-04-05 DIAGNOSIS — I340 Nonrheumatic mitral (valve) insufficiency: Secondary | ICD-10-CM

## 2024-04-05 DIAGNOSIS — R42 Dizziness and giddiness: Secondary | ICD-10-CM | POA: Diagnosis not present

## 2024-04-05 DIAGNOSIS — I10 Essential (primary) hypertension: Secondary | ICD-10-CM

## 2024-04-05 DIAGNOSIS — E669 Obesity, unspecified: Secondary | ICD-10-CM

## 2024-04-05 DIAGNOSIS — E0590 Thyrotoxicosis, unspecified without thyrotoxic crisis or storm: Secondary | ICD-10-CM | POA: Insufficient documentation

## 2024-04-05 DIAGNOSIS — F419 Anxiety disorder, unspecified: Secondary | ICD-10-CM

## 2024-04-05 DIAGNOSIS — R2681 Unsteadiness on feet: Secondary | ICD-10-CM | POA: Insufficient documentation

## 2024-04-05 DIAGNOSIS — R002 Palpitations: Secondary | ICD-10-CM

## 2024-04-05 DIAGNOSIS — R079 Chest pain, unspecified: Secondary | ICD-10-CM

## 2024-04-05 DIAGNOSIS — I5022 Chronic systolic (congestive) heart failure: Secondary | ICD-10-CM

## 2024-04-05 MED ORDER — METOPROLOL TARTRATE 5 MG/5ML IV SOLN
10.0000 mg | Freq: Once | INTRAVENOUS | Status: DC | PRN
Start: 2024-04-05 — End: 2024-04-06

## 2024-04-05 MED ORDER — NITROGLYCERIN 0.4 MG SL SUBL
0.8000 mg | SUBLINGUAL_TABLET | Freq: Once | SUBLINGUAL | Status: AC
  Administered 2024-04-05: 0.8 mg via SUBLINGUAL

## 2024-04-05 MED ORDER — DILTIAZEM HCL 25 MG/5ML IV SOLN: 10.0000 mg | INTRAVENOUS | Status: DC | PRN

## 2024-04-05 MED ORDER — NITROGLYCERIN 0.4 MG SL SUBL
SUBLINGUAL_TABLET | SUBLINGUAL | Status: AC
  Filled 2024-04-05: qty 2

## 2024-04-05 MED ORDER — IOHEXOL 350 MG/ML SOLN: 100.0000 mL | Freq: Once | INTRAVENOUS | Status: DC | PRN

## 2024-04-05 NOTE — Therapy (Signed)
 OUTPATIENT PHYSICAL THERAPY VESTIBULAR TREATMENT     Patient Name: Sarah Reilly MRN: [id number] DOB:[birth year], 43 y.o., female Today's Date: 04/05/2024  END OF SESSION:  PT End of Session - 04/05/24 0950     Visit Number 2    Number of Visits 9    Date for Recertification  04/19/24    Authorization Type [place] medicaid- [employer] [place]    PT Start Time [phone]   patient late   PT Stop Time 1015    PT Time Calculation (min) 25 min    Activity Tolerance Patient tolerated treatment well    Behavior During Therapy WFL for tasks assessed/performed          Past Medical History:  Diagnosis Date   Cardiomyopathy    CHF (congestive heart failure) (HCC)    HTN (hypertension)    Mitral regurgitation    History reviewed. No pertinent surgical history. 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   SOB (shortness of breath) 11/03/2017   Severe mitral regurgitation 07/20/2009   CHEST PAIN 07/20/2009   Essential hypertension 06/16/2009   Agoraphobia 04/22/2009   Migraine headache 04/22/2009   Pyelonephritis 04/22/2009   SYNCOPE 04/22/2009   DIZZINESS 04/22/2009   PALPITATIONS 04/22/2009   ELECTROCARDIOGRAM, ABNORMAL 04/22/2009    PCP: non reported REFERRING PROVIDER: Vaughan Ricker, MD  REFERRING DIAG: R42 (ICD-10-CM) - Vertigo   THERAPY DIAG:  Unsteadiness on feet  Dizziness and giddiness  ONSET DATE: 03/07/24 referral   Rationale for Evaluation and Treatment: Rehabilitation  SUBJECTIVE:   SUBJECTIVE STATEMENT: Patient arrives late to  appt. Endorses dizziness still. After leaving here did experience some significant dizziness with starting to fall toward 1 side. Denies overt falls. She was unable to have her cardiac CT. She is currently experiencing palpitations, but they're not bothersome.  Pt accompanied by: self  PERTINENT HISTORY: cardiomyopathy, CHF, HTN, mitral regurgitation, sciatica, h/o migraines  PAIN:  Are you having pain? Yes: NPRS scale: 5-6/10 Pain location: L side of chest (non cardiogenic)  Pain description: like a strain, intermittent sharp/shooting Aggravating factors: with movement Relieving factors: resting in certain positions  PRECAUTIONS: Fall  PATIENT GOALS: understand why I'm having vertigo/dizziness and how to get rid of it  OBJECTIVE:  Note: Objective measures were completed at Evaluation unless otherwise noted.  DIAGNOSTIC FINDINGS: 01/18/24 CT angio chest  IMPRESSION: 1. No evidence of pulmonary embolism. 2. Mild patchy right lower lobe opacity, favoring pneumonia over atelectasis. 3. Trace bilateral pleural effusions.  VESTIBULAR ASSESSMENT:  GENERAL OBSERVATION: NAD, no AD   SYMPTOM BEHAVIOR:  Subjective history: see above  Non-Vestibular symptoms: changes in hearing and changes in vision; dampened hearing and blurry vision  Type of dizziness: Spinning/Vertigo and Funny feeling in the head  Frequency: intermittent  Duration: ~1 min  Aggravating factors: Induced by position change: lying supine and rolling to the left and Worse outside or in busy environment  Relieving factors: closing eyes and rest  Progression of symptoms: unchanged  OCULOMOTOR EXAM:  Ocular Alignment: TBA  Ocular ROM: TBA  Spontaneous Nystagmus: TBA  Gaze-Induced Nystagmus: TBA  Smooth Pursuits: TBA  Saccades: TBA  Convergence/Divergence: TBA  VESTIBULAR - OCULAR REFLEX:  VOR: normal, slight dizziness VOR cancellation: normal, slight dizziness/nausea HIT R: (+) HIT L: normal, slight  dizziness   POSITIONAL TESTING: Right Roll Test: no nystagmus Left Roll Test: no nystagmus Right Sidelying: no nystagmus Left Sidelying: no nystagmus  MOTION SENSITIVITY:  Motion Sensitivity Quotient Intensity: 0 = none, 1 = Lightheaded, 2 = Mild, 3 = Moderate, 4 = Severe, 5 = Vomiting  Intensity  1. Sitting to supine 0  2. Supine to L side 0  3. Supine to R side 0  4. Supine to sitting 0  5. L Hallpike-Dix   6. Up from L    7. R Hallpike-Dix   8. Up from R    9. Sitting, head tipped to L knee   10. Head up from L knee   11. Sitting, head tipped to R knee   12. Head up from R knee   13. Sitting head turns x5   14.Sitting head nods x5   15. In stance, 180 turn to L    16. In stance, 180 turn to R     Vitals:   04/05/24 0955 04/05/24 0956  BP: (!) 146/103 (!) 135/101  Pulse: 83 84                                                                                                                             TREATMENT  NMR:  -VOR see above -seated  PATIENT EDUCATION: Education details: PTPOC, exam findings, see above Person educated: Patient Education method: Medical illustrator Education comprehension: verbalized understanding and needs further education  HOME EXERCISE PROGRAM: Gaze Stabilization: Sitting    Keeping eyes on target on wall 5 feet away, tilt head down 15-30 and move head side to side for __30__ seconds. Repeat while moving head up and down for _30___ seconds. Do _3___ sessions per day. Repeat using target on pattern background. GOALS: Goals reviewed with patient? Yes  SHORT TERM GOALS: = LTG based on PT POC length   LONG TERM GOALS: Target date: 04/19/24  Pt will be independent with final HEP for improved for symptom report  Baseline: to be provided Goal status: INITIAL  2.  Patient will demonstrate (-) positional testing to indicate resolution of BPPV  Baseline: L posterior canal canalithiasis  Goal status:  INITIAL   ASSESSMENT:  CLINICAL IMPRESSION: Patient seen for skilled PT session with emphasis on further vestibular assessment and retraining. Session largely limited by patient arriving late. She continues to endorse L sided chest pain that cardiologist maintains is not cardiogenic. BP elevated today too and patient thinks it is due to anxiety surrounding testing this afternoon. Positional testing modified to account for elevated BP. This testing shows resolution of BPPV. She demonstrated subjective motion sensitivity with VOR and VOR cancellation, but both were normal. 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 L HIT  was normal. Provided patient with VOR exercises, which she demonstrated accurately and tolerated well. Continue POC.   OBJECTIVE IMPAIRMENTS: decreased knowledge of condition and dizziness.   ACTIVITY LIMITATIONS: bed mobility, reach over head, hygiene/grooming, locomotion level, and caring for others  PARTICIPATION LIMITATIONS: meal prep, cleaning, interpersonal relationship, driving, shopping, community activity, and occupation  PERSONAL FACTORS: Age, Fitness, Past/current experiences, Sex, Social background, Time since onset of injury/illness/exacerbation, Transportation, and 3+ comorbidities: see above are also affecting patient's functional outcome.   REHAB POTENTIAL: Good  CLINICAL DECISION MAKING: Unstable/unpredictable  EVALUATION COMPLEXITY: High   PLAN:  PT FREQUENCY: 2x/week  PT DURATION: 4 weeks  PLANNED INTERVENTIONS: 97164- PT Re-evaluation, 97750- Physical Performance Testing, 97110-Therapeutic exercises, 97530- Therapeutic activity, [id number]- Neuromuscular re-education, 97535- Self Care, 02859- Manual therapy, [id number]- Gait training, [phone]- Orthotic Initial, [phone]- Orthotic/Prosthetic subsequent, [phone]- Canalith repositioning, [id number]- Aquatic Therapy, [phone] (1-2 muscles), 20561 (3+ muscles)- Dry  Needling, Patient/Family education, Balance training, Stair training, Vestibular training, Visual/preceptual remediation/compensation, Cognitive remediation, and DME instructions  PLAN FOR NEXT SESSION: re-check L BPPV, brandt daroff. If resolved- check VOR and prescribe HEP PRN    Delon DELENA Pop, PT Delon DELENA Pop, PT, DPT, CBIS  04/05/2024, 11:03 AM

## 2024-04-08 ENCOUNTER — Ambulatory Visit (HOSPITAL_COMMUNITY): Payer: Self-pay | Admitting: Family Medicine

## 2024-04-09 NOTE — Telephone Encounter (Addendum)
 Pt aware, agreeable, and verbalized understanding  Patient stated that they had issue with getting IV access for Scan.  ---- Message from Harlene CHRISTELLA Gainer sent at 04/08/2024  4:44 PM EDT ----- Coronary calcium score 0, low risk study. Good news! ----- Message ----- From: Interface, Rad Results In Sent: 04/05/2024   5:50 PM EDT To: Harlene CHRISTELLA Gainer, FNP

## 2024-04-10 ENCOUNTER — Ambulatory Visit

## 2024-04-10 VITALS — BP 155/88 | HR 99

## 2024-04-10 DIAGNOSIS — R2681 Unsteadiness on feet: Secondary | ICD-10-CM | POA: Diagnosis not present

## 2024-04-10 DIAGNOSIS — R42 Dizziness and giddiness: Secondary | ICD-10-CM

## 2024-04-10 NOTE — Therapy (Signed)
 OUTPATIENT PHYSICAL THERAPY VESTIBULAR TREATMENT     Patient Name: Sarah Reilly MRN: [id number] DOB:1980-11-26, 43 y.o., female Today's Date: 04/10/2024  END OF SESSION:  PT End of Session - 04/10/24 1012     Visit Number 3    Number of Visits 9    Date for Recertification  04/19/24    Authorization Type [place] medicaid- [employer]    Authorization - Number of Visits 5    PT Start Time 1015    PT Stop Time 1058    PT Time Calculation (min) 43 min    Activity Tolerance Patient tolerated treatment well    Behavior During Therapy WFL for tasks assessed/performed          Past Medical History:  Diagnosis Date   Cardiomyopathy    CHF (congestive heart failure) (HCC)    HTN (hypertension)    Mitral regurgitation    History reviewed. No pertinent surgical history. 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   SOB (shortness of breath) 11/03/2017   Severe mitral regurgitation 07/20/2009   CHEST PAIN 07/20/2009   Essential hypertension 06/16/2009   Agoraphobia 04/22/2009   Migraine headache 04/22/2009   Pyelonephritis 04/22/2009   SYNCOPE 04/22/2009   DIZZINESS 04/22/2009   PALPITATIONS 04/22/2009   ELECTROCARDIOGRAM, ABNORMAL 04/22/2009    PCP: non reported REFERRING PROVIDER: Vaughan Ricker, MD  REFERRING DIAG: R42 (ICD-10-CM) - Vertigo   THERAPY DIAG:  Unsteadiness on feet  Dizziness and giddiness  ONSET DATE: 03/07/24 referral   Rationale for Evaluation and Treatment: Rehabilitation  SUBJECTIVE:   SUBJECTIVE  STATEMENT: Patient arrives to appt alone. Endorses HA and L sided chest pain. HA is a 7/10 and chest pain is an 8/10. Also reports palpitations currently rated at a 8.5/10. Denies dizziness. Denies falls. Did not take tylenol  before appt and this usually helps with both HA and CP.  Pt accompanied by: self  PERTINENT HISTORY: cardiomyopathy, CHF, HTN, mitral regurgitation, sciatica, h/o migraines  PAIN:  Are you having pain? Yes: NPRS scale: 5-6/10 Pain location: L side of chest (non cardiogenic)  Pain description: like a strain, intermittent sharp/shooting Aggravating factors: with movement Relieving factors: resting in certain positions  PRECAUTIONS: Fall  PATIENT GOALS: understand why I'm having vertigo/dizziness and how to get rid of it  OBJECTIVE:  Note: Objective measures were completed at Evaluation unless otherwise noted.  DIAGNOSTIC FINDINGS: 01/18/24 CT angio chest  IMPRESSION: 1. No evidence of pulmonary embolism. 2. Mild patchy right lower lobe opacity, favoring pneumonia over atelectasis. 3. Trace bilateral pleural effusions.  VESTIBULAR ASSESSMENT:  GENERAL OBSERVATION: NAD, no AD   SYMPTOM BEHAVIOR:  Subjective history: see above  Non-Vestibular symptoms: changes in hearing and changes in vision; dampened hearing and blurry vision  Type of dizziness: Spinning/Vertigo and Funny feeling in the head  Frequency: intermittent  Duration: ~1 min  Aggravating factors: Induced by position change: lying supine and rolling to the left and Worse outside or in busy environment  Relieving factors: closing eyes and rest  Progression of symptoms: unchanged  OCULOMOTOR EXAM:  Ocular Alignment: normal  Ocular ROM: normal  Spontaneous Nystagmus: none  Gaze-Induced Nystagmus: none  Smooth Pursuits: normal  Saccades: Normal  Convergence/Divergence: ~5cm  VESTIBULAR - OCULAR REFLEX:  VOR: normal, slight dizziness VOR cancellation: normal, slight dizziness/nausea HIT  R: (+) HIT L: normal, slight dizziness   POSITIONAL TESTING: Right Dix-Hallpike: upbeating, right nystagmus Left Dix-Hallpike: upbeating, left nystagmus Right Roll Test: no nystagmus Left Roll Test: no nystagmus  MOTION SENSITIVITY:  Motion Sensitivity Quotient Intensity: 0 = none, 1 = Lightheaded, 2 = Mild, 3 = Moderate, 4 = Severe, 5 = Vomiting  Intensity  1. Sitting to supine 0  2. Supine to L side 0  3. Supine to R side 0  4. Supine to sitting 0  5. L Hallpike-Dix   6. Up from L    7. R Hallpike-Dix   8. Up from R    9. Sitting, head tipped to L knee   10. Head up from L knee   11. Sitting, head tipped to R knee   12. Head up from R knee   13. Sitting head turns x5   14.Sitting head nods x5   15. In stance, 180 turn to L    16. In stance, 180 turn to R     Vitals:   04/10/24 1021  BP: (!) 155/88  Pulse: 99                                                                                                                              TREATMENT  NMR:  -positional testing revealing possible B posterior canal canalithiasis  -symptoms worse R>L   -PT unable to observe if it fatigued or not as patient requested to sit up due to nausea -brandt daroff to habituate  -oculomotor exam, see bolded above  -discussion on etiology of dizziness   -given possible direction changing nystagmus, though positional, could be migraine-related?   PATIENT EDUCATION: Education details: exam findings Person educated: Patient Education method: Medical illustrator Education comprehension: verbalized understanding and needs further education  HOME EXERCISE PROGRAM: Gaze Stabilization: Sitting    Keeping eyes on target on wall 5 feet away, tilt head down 15-30 and move head side to side for __30__ seconds. Repeat while moving head up and down for _30___ seconds. Do _3___ sessions per day. Repeat using target on pattern background. GOALS: Goals reviewed with patient?  Yes  SHORT TERM GOALS: = LTG based on PT POC length   LONG TERM GOALS: Target date: 04/19/24  Pt will be independent with final HEP for improved for symptom report  Baseline: to be provided Goal status: INITIAL  2.  Patient will demonstrate (-) positional testing to indicate resolution of BPPV  Baseline: L posterior canal canalithiasis  Goal status: INITIAL   ASSESSMENT:  CLINICAL IMPRESSION: Patient seen for skilled PT session with emphasis on vestibular exam and treatment. Today, she presented with B positive dix hallpike with respective L torsional upbeating nystagmus and R torsional upbeating nystagmus. PT  unable to observe if R posterior canal was positive for canal or cupulolithiasis as patient requested to sit up due to nausea. Re-tested with sidelying: L side was (-) on re-check. R side remained (+), but habituated with brandt daroff. Patient voiced mold exposure and questions whether that is contributing to her symptoms. PT deferring to MD. Patient also currently experiencing a HA, borderline migraine. PT questioning direction changing nystagmus as a result of a migraine. Continue POC.   OBJECTIVE IMPAIRMENTS: decreased knowledge of condition and dizziness.   ACTIVITY LIMITATIONS: bed mobility, reach over head, hygiene/grooming, locomotion level, and caring for others  PARTICIPATION LIMITATIONS: meal prep, cleaning, interpersonal relationship, driving, shopping, community activity, and occupation  PERSONAL FACTORS: Age, Fitness, Past/current experiences, Sex, Social background, Time since onset of injury/illness/exacerbation, Transportation, and 3+ comorbidities: see above are also affecting patient's functional outcome.   REHAB POTENTIAL: Good  CLINICAL DECISION MAKING: Unstable/unpredictable  EVALUATION COMPLEXITY: High   PLAN:  PT FREQUENCY: 2x/week  PT DURATION: 4 weeks  PLANNED INTERVENTIONS: 97164- PT Re-evaluation, 97750- Physical Performance Testing,  97110-Therapeutic exercises, 97530- Therapeutic activity, [id number]- Neuromuscular re-education, 97535- Self Care, [id number]- Manual therapy, [id number]- Gait training, [phone]- Orthotic Initial, [phone]- Orthotic/Prosthetic subsequent, [phone]- Canalith repositioning, [id number]- Aquatic Therapy, [phone] (1-2 muscles), [phone] (3+ muscles)- Dry Needling, Patient/Family education, Balance training, Stair training, Vestibular training, Visual/preceptual remediation/compensation, Cognitive remediation, and DME instructions  PLAN FOR NEXT SESSION: re-check L BPPV, brandt daroff. If resolved- check VOR and prescribe HEP PRN    Delon DELENA Pop, PT Delon DELENA Pop, PT, DPT, CBIS  04/10/2024, 12:10 PM

## 2024-04-12 ENCOUNTER — Ambulatory Visit

## 2024-04-12 VITALS — BP 142/97 | HR 75

## 2024-04-12 DIAGNOSIS — R42 Dizziness and giddiness: Secondary | ICD-10-CM

## 2024-04-12 DIAGNOSIS — R2681 Unsteadiness on feet: Secondary | ICD-10-CM | POA: Diagnosis not present

## 2024-04-12 NOTE — Therapy (Signed)
 OUTPATIENT PHYSICAL THERAPY VESTIBULAR TREATMENT     Patient Name: Sarah Reilly MRN: [id number] DOB:Sep 12, 1980, 43 y.o., female Today's Date: 04/12/2024  END OF SESSION:  PT End of Session - 04/12/24 1148     Visit Number 4    Number of Visits 9    Date for Recertification  04/19/24    Authorization Type [place] medicaid- [employer]    Authorization - Number of Visits 5    PT Start Time 1147   patient late   PT Stop Time 1220   BPPV tx   PT Time Calculation (min) 33 min    Activity Tolerance Patient tolerated treatment well    Behavior During Therapy Anxious          Past Medical History:  Diagnosis Date   Cardiomyopathy    CHF (congestive heart failure) (HCC)    HTN (hypertension)    Mitral regurgitation    History reviewed. No pertinent surgical history. 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   SOB (shortness of breath) 11/03/2017   Severe mitral regurgitation 07/20/2009   CHEST PAIN 07/20/2009   Essential hypertension 06/16/2009   Agoraphobia 04/22/2009   Migraine headache 04/22/2009   Pyelonephritis 04/22/2009   SYNCOPE 04/22/2009   DIZZINESS 04/22/2009   PALPITATIONS 04/22/2009   ELECTROCARDIOGRAM, ABNORMAL 04/22/2009    PCP: non reported REFERRING PROVIDER: Vaughan Ricker, MD  REFERRING DIAG: R42 (ICD-10-CM) - Vertigo   THERAPY DIAG:  Unsteadiness on feet  Dizziness and giddiness  ONSET DATE: 03/07/24 referral   Rationale for Evaluation and Treatment: Rehabilitation  SUBJECTIVE:   SUBJECTIVE  STATEMENT: Patient arrives to clinic alone. Reports this morning she had sudden sharp under L breast. Her typical L sided chest pain migrated over to the R side. When this happened she was dizzy, but not spinning. She described it as being on a [employer]. She does endorse increasing levels of anxiety, which is not being managed presently. Denies falls.  Pt accompanied by: self  PERTINENT HISTORY: cardiomyopathy, CHF, HTN, mitral regurgitation, sciatica, h/o migraines  PAIN:  Are you having pain? Yes: NPRS scale: 5-6/10 Pain location: L side of chest (non cardiogenic)  Pain description: like a strain, intermittent sharp/shooting Aggravating factors: with movement Relieving factors: resting in certain positions  PRECAUTIONS: Fall  PATIENT GOALS: understand why I'm having vertigo/dizziness and how to get rid of it  OBJECTIVE:  Note: Objective measures were completed at Evaluation unless otherwise noted.  DIAGNOSTIC FINDINGS: 01/18/24 CT angio chest  IMPRESSION: 1. No evidence of pulmonary embolism. 2. Mild patchy right lower lobe opacity, favoring pneumonia over atelectasis. 3. Trace bilateral pleural effusions.  VESTIBULAR ASSESSMENT:  GENERAL OBSERVATION: NAD, no AD   SYMPTOM BEHAVIOR:  Subjective history: see above  Non-Vestibular symptoms: changes in hearing and changes in vision; dampened hearing and blurry vision  Type of dizziness: Spinning/Vertigo and Funny feeling in the head  Frequency: intermittent  Duration: ~1 min  Aggravating factors: Induced by position change: lying supine and rolling to the left and Worse outside or in busy environment  Relieving factors: closing eyes and rest  Progression of symptoms:  unchanged  OCULOMOTOR EXAM:  Ocular Alignment: normal  Ocular ROM: normal  Spontaneous Nystagmus: none  Gaze-Induced Nystagmus: none  Smooth Pursuits: normal  Saccades: Normal  Convergence/Divergence: ~5cm  VESTIBULAR - OCULAR REFLEX:  VOR:  normal, slight dizziness VOR cancellation: normal, slight dizziness/nausea HIT R: (+) HIT L: normal, slight dizziness   POSITIONAL TESTING: Right Sidelying: no nystagmus Left Sidelying: no nystagmus  MOTION SENSITIVITY:  Motion Sensitivity Quotient Intensity: 0 = none, 1 = Lightheaded, 2 = Mild, 3 = Moderate, 4 = Severe, 5 = Vomiting  Intensity  1. Sitting to supine 0  2. Supine to L side 0  3. Supine to R side 0  4. Supine to sitting 0  5. L Hallpike-Dix   6. Up from L    7. R Hallpike-Dix   8. Up from R    9. Sitting, head tipped to L knee   10. Head up from L knee   11. Sitting, head tipped to R knee   12. Head up from R knee   13. Sitting head turns x5   14.Sitting head nods x5   15. In stance, 180 turn to L    16. In stance, 180 turn to R     Vitals:   04/12/24 1202 04/12/24 1203  BP: (!) 153/102 (!) 142/97  Pulse: 75 75                                                                                                                               TREATMENT  Theract: -extensive education over etiology of remaining dizziness  -PT questioning anxiety component as patient endorses significant, uncontrolled anxiety and panic regarding her health  -sidelying positional testing (-), no BPPV -provided patient with [facility] handout for counseling services   PATIENT EDUCATION: Education details: exam findings, see above Person educated: Patient Education method: Medical illustrator Education comprehension: verbalized understanding and needs further education  HOME EXERCISE PROGRAM: Gaze Stabilization: Sitting    Keeping eyes on target on wall 5 feet away, tilt head down 15-30 and move head side to side for __30__ seconds. Repeat while moving head up and down for _30___ seconds. Do _3___ sessions per day. Repeat using target on pattern background. GOALS: Goals reviewed with patient? Yes  SHORT TERM GOALS: = LTG based on PT POC length   LONG  TERM GOALS: Target date: 04/19/24  Pt will be independent with final HEP for improved for symptom report  Baseline: to be provided Goal status: INITIAL  2.  Patient will demonstrate (-) positional testing to indicate resolution of BPPV  Baseline: L posterior canal canalithiasis  Goal status: INITIAL   ASSESSMENT:  CLINICAL IMPRESSION: Patient seen for skilled PT session with emphasis on BPPV testing and patient education. Today, her positional testing was all normal indicating resolution of BPPV. She does endorse episodes of significant dizziness, not described as spinning, and mentions increased anxiety at those times. PT educating patient  on importance of managing this and not necessarily with pharmacological measures. Patient slightly hypertensive today, but attributes this to anxiety and increased sodium intake recently. PT encouraging patient to follow up with PCP regarding this. Continue POC.   OBJECTIVE IMPAIRMENTS: decreased knowledge of condition and dizziness.   ACTIVITY LIMITATIONS: bed mobility, reach over head, hygiene/grooming, locomotion level, and caring for others  PARTICIPATION LIMITATIONS: meal prep, cleaning, interpersonal relationship, driving, shopping, community activity, and occupation  PERSONAL FACTORS: Age, Fitness, Past/current experiences, Sex, Social background, Time since onset of injury/illness/exacerbation, Transportation, and 3+ comorbidities: see above are also affecting patient's functional outcome.   REHAB POTENTIAL: Good  CLINICAL DECISION MAKING: Unstable/unpredictable  EVALUATION COMPLEXITY: High   PLAN:  PT FREQUENCY: 2x/week  PT DURATION: 4 weeks  PLANNED INTERVENTIONS: 97164- PT Re-evaluation, 97750- Physical Performance Testing, 97110-Therapeutic exercises, 97530- Therapeutic activity, [id number]- Neuromuscular re-education, 97535- Self Care, 02859- Manual therapy, [id number]- Gait training, [phone]- Orthotic Initial, [phone]- Orthotic/Prosthetic  subsequent, [phone]- Canalith repositioning, [id number]- Aquatic Therapy, [phone] (1-2 muscles), [phone] (3+ muscles)- Dry Needling, Patient/Family education, Balance training, Stair training, Vestibular training, Visual/preceptual remediation/compensation, Cognitive remediation, and DME instructions  PLAN FOR NEXT SESSION: re-check L BPPV, brandt daroff. If resolved- check VOR and prescribe HEP PRN   If BPPV remains clear, likely dc    Delon DELENA Pop, PT Delon DELENA Pop, PT, DPT, CBIS  04/12/2024, 12:29 PM

## 2024-04-19 ENCOUNTER — Ambulatory Visit

## 2024-04-19 VITALS — BP 129/87 | HR 75

## 2024-04-19 DIAGNOSIS — R42 Dizziness and giddiness: Secondary | ICD-10-CM

## 2024-04-19 DIAGNOSIS — R2681 Unsteadiness on feet: Secondary | ICD-10-CM

## 2024-04-19 NOTE — Therapy (Signed)
 OUTPATIENT PHYSICAL THERAPY VESTIBULAR TREATMENT/ RE-CERT     Patient Name: Sarah Reilly MRN: [id number] DOB:07-06-1980, 43 y.o., female Today's Date: 04/19/2024  END OF SESSION:  PT End of Session - 04/19/24 1209     Visit Number 5    Number of Visits 9    Date for Recertification  04/19/24    Authorization Type [place] medicaid- [employer]    Authorization - Number of Visits 5    PT Start Time 1201   patient late   PT Stop Time 1224   BPPV tx   PT Time Calculation (min) 23 min    Activity Tolerance Patient tolerated treatment well    Behavior During Therapy Anxious          Past Medical History:  Diagnosis Date   Cardiomyopathy    CHF (congestive heart failure) (HCC)    HTN (hypertension)    Mitral regurgitation    No past surgical history on file. 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   SOB (shortness of breath) 11/03/2017   Severe mitral regurgitation 07/20/2009   CHEST PAIN 07/20/2009   Essential hypertension 06/16/2009   Agoraphobia 04/22/2009   Migraine headache 04/22/2009   Pyelonephritis 04/22/2009   SYNCOPE 04/22/2009   DIZZINESS 04/22/2009   PALPITATIONS 04/22/2009   ELECTROCARDIOGRAM, ABNORMAL 04/22/2009    PCP: non reported REFERRING PROVIDER: Vaughan Ricker, MD  REFERRING DIAG: R42 (ICD-10-CM) - Vertigo   THERAPY DIAG:  Unsteadiness on feet - Plan: PT plan of care cert/re-cert  Dizziness and giddiness - Plan: PT plan of care cert/re-cert  ONSET DATE: 03/07/24 referral   Rationale for Evaluation and  Treatment: Rehabilitation  SUBJECTIVE:   SUBJECTIVE STATEMENT: Patient arrives late, alone. Reports a few instances of dizziness since last visit, including rolling over in bed to plug in her phone. She mentions that she wants to go to an Urgent Care on Sunday to have her Troponin levels checked and to have an EKG. She denies falls.  Pt accompanied by: self  PERTINENT HISTORY: cardiomyopathy, CHF, HTN, mitral regurgitation, sciatica, h/o migraines  PAIN:  Are you having pain? Yes: NPRS scale: 5-6/10 Pain location: L side of chest (non cardiogenic)  Pain description: like a strain, intermittent sharp/shooting Aggravating factors: with movement Relieving factors: resting in certain positions  PRECAUTIONS: Fall  PATIENT GOALS: understand why I'm having vertigo/dizziness and how to get rid of it  OBJECTIVE:  Note: Objective measures were completed at Evaluation unless otherwise noted.  DIAGNOSTIC FINDINGS: 01/18/24 CT angio chest  IMPRESSION: 1. No evidence of pulmonary embolism. 2. Mild patchy right lower lobe opacity, favoring pneumonia over atelectasis. 3. Trace bilateral pleural effusions.  VESTIBULAR ASSESSMENT:  GENERAL OBSERVATION: NAD, no AD   SYMPTOM BEHAVIOR:  Subjective history: see above  Non-Vestibular symptoms: changes in hearing and changes in vision; dampened hearing and blurry vision  Type of dizziness: Spinning/Vertigo and Funny feeling in the head  Frequency: intermittent  Duration: ~1 min  Aggravating factors: Induced by position change: lying supine and rolling to the left and Worse outside or in busy environment  Relieving factors: closing eyes  and rest  Progression of symptoms: unchanged  OCULOMOTOR EXAM:  Ocular Alignment: normal  Ocular ROM: normal  Spontaneous Nystagmus: none  Gaze-Induced Nystagmus: none  Smooth Pursuits: normal  Saccades: Normal  Convergence/Divergence: ~5cm  VESTIBULAR - OCULAR REFLEX:  VOR: normal, slight  dizziness VOR cancellation: normal, slight dizziness/nausea HIT R: (+) HIT L: normal, slight dizziness   POSITIONAL TESTING: Right Sidelying: no nystagmus Left Sidelying: no nystagmus  MOTION SENSITIVITY:  Motion Sensitivity Quotient Intensity: 0 = none, 1 = Lightheaded, 2 = Mild, 3 = Moderate, 4 = Severe, 5 = Vomiting  Intensity  1. Sitting to supine 0  2. Supine to L side 0  3. Supine to R side 0  4. Supine to sitting 0  5. L Hallpike-Dix   6. Up from L    7. R Hallpike-Dix   8. Up from R    9. Sitting, head tipped to L knee   10. Head up from L knee   11. Sitting, head tipped to R knee   12. Head up from R knee   13. Sitting head turns x5   14.Sitting head nods x5   15. In stance, 180 turn to L    16. In stance, 180 turn to R     Vitals:   04/19/24 1209  BP: 129/87  Pulse: 75                                                                                                                               TREATMENT  Theract: -extensive education over etiology of remaining dizziness  -PT questioning anxiety component as patient endorses significant, uncontrolled anxiety and panic regarding her health  -sidelying positional testing (-), no BPPV -provided patient with [facility] handout for counseling services   PATIENT EDUCATION: Education details: exam findings, see above Person educated: Patient Education method: Medical illustrator Education comprehension: verbalized understanding and needs further education  HOME EXERCISE PROGRAM: Gaze Stabilization: Sitting    Keeping eyes on target on wall 5 feet away, tilt head down 15-30 and move head side to side for __30__ seconds. Repeat while moving head up and down for _30___ seconds. Do _3___ sessions per day. Repeat using target on pattern background.  Sit to Side-Lying    Sit on edge of bed. 1. Turn head 45 to right. 2. Maintain head position and lie down slowly on left side. Hold until  symptoms subside. 3. Sit up slowly. Hold until symptoms subside. 4. Turn head 45 to left. 5. Maintain head position and lie down slowly on right side. Hold until symptoms subside. 6. Sit up slowly. Repeat sequence ___5_ times per session. Do _2-3___ sessions per day. GOALS: Goals reviewed with patient? Yes  SHORT TERM GOALS: = LTG based on PT POC length   LONG TERM GOALS: Target date: 04/19/24  Pt will be independent with final HEP for improved for symptom report  Baseline: to be provided Goal status:  INITIAL  2.  Patient will demonstrate (-) positional testing to indicate resolution of BPPV  Baseline: L posterior canal canalithiasis  Goal status: INITIAL  NEW LONG TERM GOALS: 04/26/24  1. Patient will demonstrate (-) positional testing to indicate resolution of BPPV   Baseline: L posterior canal canalithiasis    Goal status: NEW    ASSESSMENT:  CLINICAL IMPRESSION: Patient seen for skilled PT session with emphasis on goal assessment and positional testing. Patient continues to present with non-specific dizziness, at times unprovoked by movement. She mentions other health concerns outside the scope of PT, such as feeling as though her throat was closing though this was a transient feeling. PT deferring to appropriate medical providers to address those concerns. Today her positional testing was clear, but she would benefit from at least 1 further appt to ensure resolution of BPPV. Continue POC.   OBJECTIVE IMPAIRMENTS: decreased knowledge of condition and dizziness.   ACTIVITY LIMITATIONS: bed mobility, reach over head, hygiene/grooming, locomotion level, and caring for others  PARTICIPATION LIMITATIONS: meal prep, cleaning, interpersonal relationship, driving, shopping, community activity, and occupation  PERSONAL FACTORS: Age, Fitness, Past/current experiences, Sex, Social background, Time since onset of injury/illness/exacerbation, Transportation, and 3+ comorbidities: see  above are also affecting patient's functional outcome.   REHAB POTENTIAL: Good  CLINICAL DECISION MAKING: Unstable/unpredictable  EVALUATION COMPLEXITY: High   PLAN:  PT FREQUENCY: 2x/week  PT DURATION: 4 weeks  PLANNED INTERVENTIONS: 97164- PT Re-evaluation, 97750- Physical Performance Testing, 97110-Therapeutic exercises, 97530- Therapeutic activity, [id number]- Neuromuscular re-education, 97535- Self Care, 02859- Manual therapy, [id number]- Gait training, [phone]- Orthotic Initial, [phone]- Orthotic/Prosthetic subsequent, [phone]- Canalith repositioning, [id number]- Aquatic Therapy, [phone] (1-2 muscles), [id number] (3+ muscles)- Dry Needling, Patient/Family education, Balance training, Stair training, Vestibular training, Visual/preceptual remediation/compensation, Cognitive remediation, and DME instructions  PLAN FOR NEXT SESSION:  If BPPV remains clear, dc    Delon DELENA Pop, PT Delon DELENA Pop, PT, DPT, CBIS  04/19/2024, 12:51 PM

## 2024-04-26 ENCOUNTER — Encounter: Payer: Self-pay | Admitting: Physical Therapy

## 2024-04-26 ENCOUNTER — Ambulatory Visit: Payer: Self-pay | Admitting: Physical Therapy

## 2024-04-26 VITALS — BP 138/97 | HR 83

## 2024-04-26 DIAGNOSIS — R42 Dizziness and giddiness: Secondary | ICD-10-CM

## 2024-04-26 DIAGNOSIS — R2681 Unsteadiness on feet: Secondary | ICD-10-CM

## 2024-04-26 NOTE — Therapy (Signed)
 OUTPATIENT PHYSICAL THERAPY VESTIBULAR TREATMENT/ DISCHARGE SUMMARY     Patient Name: Sarah Reilly MRN: [id number] DOB:1981/05/05, 43 y.o., female Today's Date: 04/26/2024  END OF SESSION:  PT End of Session - 04/26/24 1019     Visit Number 6    Number of Visits 9    Date for Recertification  04/19/24    Authorization Type [place] medicaid- [employer]    Authorization - Number of Visits 5    PT Start Time 1018    PT Stop Time 1038   full time not used due to D/C visit   PT Time Calculation (min) 20 min    Activity Tolerance Patient tolerated treatment well    Behavior During Therapy WFL for tasks assessed/performed          Past Medical History:  Diagnosis Date   Cardiomyopathy    CHF (congestive heart failure) (HCC)    HTN (hypertension)    Mitral regurgitation    History reviewed. No pertinent surgical history. 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   SOB (shortness of breath) 11/03/2017   Severe mitral regurgitation 07/20/2009   CHEST PAIN 07/20/2009   Essential hypertension 06/16/2009   Agoraphobia 04/22/2009   Migraine headache 04/22/2009   Pyelonephritis 04/22/2009   SYNCOPE 04/22/2009   DIZZINESS 04/22/2009   PALPITATIONS 04/22/2009   ELECTROCARDIOGRAM, ABNORMAL 04/22/2009    PCP: non reported REFERRING PROVIDER: Vaughan Ricker, MD  REFERRING DIAG: R42 (ICD-10-CM) - Vertigo   THERAPY DIAG:  Unsteadiness on feet  Dizziness and giddiness  ONSET DATE: 03/07/24 referral   Rationale for Evaluation and Treatment:  Rehabilitation  SUBJECTIVE:   SUBJECTIVE STATEMENT: Reports having palpitations right now. Has had this addressed and reports it is not her heart. Does not feel any anxiety right now. Will see her PCP on November 20th. Had dizziness this morning, last night. Thinks she was too low in the bed last night and felt woozy. Normally sleeps on an incline because she can't breathe when she lays all the way flat.   Pt accompanied by: self  PERTINENT HISTORY: cardiomyopathy, CHF, HTN, mitral regurgitation, sciatica, h/o migraines  PAIN:  Are you having pain? Yes: NPRS scale: 3/10 Pain location: L side of chest (non cardiogenic)  Pain description: like a strain, intermittent sharp/shooting Aggravating factors: with movement Relieving factors: resting in certain positions  Pt reports that this is normal for her, cardiologist is aware   PRECAUTIONS: Fall  PATIENT GOALS: understand why I'm having vertigo/dizziness and how to get rid of it  OBJECTIVE:  Note: Objective measures were completed at Evaluation unless otherwise noted.  DIAGNOSTIC FINDINGS: 01/18/24 CT angio chest  IMPRESSION: 1. No evidence of pulmonary embolism. 2. Mild patchy right lower lobe opacity, favoring pneumonia over atelectasis. 3. Trace bilateral pleural effusions.  VESTIBULAR ASSESSMENT:  GENERAL OBSERVATION: NAD, no AD   SYMPTOM BEHAVIOR:  Subjective history: see above  Non-Vestibular symptoms: changes in hearing and changes in vision; dampened hearing and blurry vision  Type of dizziness: Spinning/Vertigo and Funny feeling in the head  Frequency: intermittent  Duration: ~1 min  Aggravating factors: Induced  by position change: lying supine and rolling to the left and Worse outside or in busy environment  Relieving factors: closing eyes and rest  Progression of symptoms: unchanged  OCULOMOTOR EXAM:  Ocular Alignment: normal  Ocular ROM: normal  Spontaneous Nystagmus: none  Gaze-Induced Nystagmus:  none  Smooth Pursuits: normal  Saccades: Normal  Convergence/Divergence: ~5cm  VESTIBULAR - OCULAR REFLEX:  VOR: normal, slight dizziness VOR cancellation: normal, slight dizziness/nausea HIT R: (+) HIT L: normal, slight dizziness   POSITIONAL TESTING: Right Sidelying: no nystagmus Left Sidelying: no nystagmus  MOTION SENSITIVITY:  Motion Sensitivity Quotient Intensity: 0 = none, 1 = Lightheaded, 2 = Mild, 3 = Moderate, 4 = Severe, 5 = Vomiting  Intensity  1. Sitting to supine 0  2. Supine to L side 0  3. Supine to R side 0  4. Supine to sitting 0  5. L Hallpike-Dix   6. Up from L    7. R Hallpike-Dix   8. Up from R    9. Sitting, head tipped to L knee   10. Head up from L knee   11. Sitting, head tipped to R knee   12. Head up from R knee   13. Sitting head turns x5   14.Sitting head nods x5   15. In stance, 180 turn to L    16. In stance, 180 turn to R     Vitals:   04/26/24 1027  BP: (!) 138/97  Pulse: 83                                                                                                                                TREATMENT    Therapeutic Activity:  Vitals:   04/26/24 1027  BP: (!) 138/97  Pulse: 83   Pt reports that she took her BP medication about an hour ago. Pt's diastolic has been elevated in previous PT sessions and pt has been monitoring it at home. Pt reports that she can tell when her BP is high and normally has to take a cold shower. Educated to continue to check BP at home and follow up with her PCP regarding this and to see if they have to make any modifications to her medications.   POSITIONAL TESTING: Right Roll Test: no nystagmus Left Roll Test: no nystagmus and felt a little nauseous Right Sidelying: no nystagmus and reports dizziness in position and slightly nauseous when coming back up  Left Sidelying: no nystagmus and reports nausea when sitting back up   Provided education that pt's BPPV is cleared and that is not  current cause of dizziness. Verbally reviewed pt's HEP. Reviewed what primary PT educated pt regarding etiology of remaining dizziness as that anxiety can be contributing. Pt had been provided handout for counseling services and discussed having pt talk about this with her PCP at upcoming appt in a few weeks.   PATIENT EDUCATION: Education details: See above, D/C from PT  Person educated: Patient Education method: Medical Illustrator Education comprehension: verbalized understanding and needs further education  HOME EXERCISE PROGRAM: Gaze Stabilization: Sitting    Keeping eyes on target on wall 5 feet away, tilt head down 15-30 and move head side to side for __30__ seconds. Repeat while moving head up and down for _30___ seconds. Do _3___ sessions per day. Repeat using target on pattern background.  Sit to Side-Lying    Sit on edge of bed. 1. Turn head 45 to right. 2. Maintain head position and lie down slowly on left side. Hold until symptoms subside. 3. Sit up slowly. Hold until symptoms subside. 4. Turn head 45 to left. 5. Maintain head position and lie down slowly on right side. Hold until symptoms subside. 6. Sit up slowly. Repeat sequence ___5_ times per session. Do _2-3___ sessions per day.  PHYSICAL THERAPY DISCHARGE SUMMARY  Visits from Start of Care: 6  Current functional level related to goals / functional outcomes: See LTGs/Clinical Assessment Statement    Remaining deficits: Dizziness (BPPV), anxiety, palptations (has seen cardiology regarding this)   Education / Equipment: HEP, BPPV education   Patient agrees to discharge. Patient goals were met. Patient is being discharged due to meeting the stated rehab goals. Pt demonstrated resolution of BPPV, pt to follow up with    GOALS: Goals reviewed with patient? Yes  SHORT TERM GOALS: = LTG based on PT POC length   LONG TERM GOALS: Target date: 04/19/24  Pt will be independent with final HEP for  improved for symptom report  Baseline: to be provided Goal status: INITIAL  2.  Patient will demonstrate (-) positional testing to indicate resolution of BPPV  Baseline: L posterior canal canalithiasis  Goal status: INITIAL  NEW LONG TERM GOALS: 04/26/24  1. Patient will demonstrate (-) positional testing to indicate resolution of BPPV   Baseline: L posterior canal canalithiasis   Neg on 04/26/24   Goal status: MET   ASSESSMENT:  CLINICAL IMPRESSION: Today's skilled session focused on re-assessment of positional testing. Pt still negative for BPPV at this time. Pt continues with non-specific dizziness where anxiety may be a component. Pt's remaining health concerns and palpitations are out of PT's scope of practice. Pt will be discharged at this time to follow-up with appropriate medical providers. Pt in agreement with plan.   OBJECTIVE IMPAIRMENTS: decreased knowledge of condition and dizziness.   ACTIVITY LIMITATIONS: bed mobility, reach over head, hygiene/grooming, locomotion level, and caring for others  PARTICIPATION LIMITATIONS: meal prep, cleaning, interpersonal relationship, driving, shopping, community activity, and occupation  PERSONAL FACTORS: Age, Fitness, Past/current experiences, Sex, Social background, Time since onset of injury/illness/exacerbation, Transportation, and 3+ comorbidities: see above are also affecting patient's functional outcome.   REHAB POTENTIAL: Good  CLINICAL DECISION MAKING: Unstable/unpredictable  EVALUATION COMPLEXITY: High   PLAN:  PT FREQUENCY: 2x/week  PT DURATION: 4 weeks  PLANNED INTERVENTIONS: 97164- PT Re-evaluation, 97750- Physical Performance Testing, 97110-Therapeutic exercises, 97530- Therapeutic activity, [id number]- Neuromuscular re-education, 97535- Self Care, 02859- Manual therapy, [id number]- Gait training, [phone]- Orthotic Initial, [phone]- Orthotic/Prosthetic subsequent, [phone]- Canalith repositioning, [id number]- Aquatic Therapy, [phone]  (1-2 muscles), 20561 (3+ muscles)- Dry Needling, Patient/Family education, Balance training, Stair training, Vestibular training, Visual/preceptual remediation/compensation, Cognitive remediation, and DME instructions  PLAN FOR NEXT SESSION:  D/C   Sheffield Senate, PT, DPT 04/26/24 10:49 AM

## 2024-05-10 ENCOUNTER — Emergency Department (HOSPITAL_BASED_OUTPATIENT_CLINIC_OR_DEPARTMENT_OTHER): Admitting: Radiology

## 2024-05-10 ENCOUNTER — Emergency Department (HOSPITAL_BASED_OUTPATIENT_CLINIC_OR_DEPARTMENT_OTHER)
Admission: EM | Admit: 2024-05-10 | Discharge: 2024-05-10 | Disposition: A | Discharge status: Home / Self Care | Attending: Emergency Medicine | Admitting: Emergency Medicine

## 2024-05-10 ENCOUNTER — Encounter (HOSPITAL_BASED_OUTPATIENT_CLINIC_OR_DEPARTMENT_OTHER): Payer: Self-pay

## 2024-05-10 ENCOUNTER — Other Ambulatory Visit: Payer: Self-pay

## 2024-05-10 DIAGNOSIS — Z91040 Latex allergy status: Secondary | ICD-10-CM | POA: Diagnosis not present

## 2024-05-10 DIAGNOSIS — R079 Chest pain, unspecified: Secondary | ICD-10-CM | POA: Diagnosis not present

## 2024-05-10 DIAGNOSIS — J984 Other disorders of lung: Secondary | ICD-10-CM | POA: Diagnosis not present

## 2024-05-10 DIAGNOSIS — J9811 Atelectasis: Secondary | ICD-10-CM | POA: Diagnosis not present

## 2024-05-10 DIAGNOSIS — I509 Heart failure, unspecified: Secondary | ICD-10-CM | POA: Insufficient documentation

## 2024-05-10 DIAGNOSIS — R0789 Other chest pain: Secondary | ICD-10-CM | POA: Diagnosis not present

## 2024-05-10 LAB — CBC
HCT: 42 % (ref 36.0–46.0)
Hemoglobin: 13.5 g/dL (ref 12.0–15.0)
MCH: 27.1 pg (ref 26.0–34.0)
MCHC: 32.1 g/dL (ref 30.0–36.0)
MCV: 84.3 fL (ref 80.0–100.0)
Platelets: 198 K/uL (ref 150–400)
RBC: 4.98 MIL/uL (ref 3.87–5.11)
RDW: 13.2 % (ref 11.5–15.5)
WBC: 5.4 K/uL (ref 4.0–10.5)
nRBC: 0 % (ref 0.0–0.2)

## 2024-05-10 LAB — BASIC METABOLIC PANEL WITH GFR
Anion gap: 10 (ref 5–15)
BUN: 8 mg/dL (ref 6–20)
CO2: 23 mmol/L (ref 22–32)
Calcium: 9.8 mg/dL (ref 8.9–10.3)
Chloride: 104 mmol/L (ref 98–111)
Creatinine, Ser: 0.77 mg/dL (ref 0.44–1.00)
GFR, Estimated: >60 mL/min (ref >60)
Glucose, Bld: 134 mg/dL — ABNORMAL HIGH (ref 70–99)
Potassium: 3.9 mmol/L (ref 3.5–5.1)
Sodium: 137 mmol/L (ref 135–145)

## 2024-05-10 LAB — TROPONIN T, HIGH SENSITIVITY
Troponin T High Sensitivity: <15 ng/L (ref 0–19)
Troponin T High Sensitivity: <15 ng/L (ref 0–19)

## 2024-05-10 LAB — PREGNANCY, URINE: Preg Test, Ur: NEGATIVE

## 2024-05-10 NOTE — Discharge Instructions (Addendum)
 Keep your scheduled appointment with your cardiologist and neurologist.  Follow-up with your PCP as needed.  Return to the emergency department if your symptoms worsen.

## 2024-05-10 NOTE — ED Notes (Signed)
 Reviewed AVS/discharge instructions with patient. Time allotted for and all questions answered. Patient is agreeable for d/c and escorted to ED exit by staff.

## 2024-05-10 NOTE — ED Triage Notes (Signed)
 Patient reports chest pain forever which she has daily. As well as increase blood pressure. Says it was right sided and sharp today when usually it is left sided. She says that she also has had dizziness for as few days. She said that she sees physical therapy for this for vertigo.

## 2024-05-10 NOTE — ED Provider Notes (Signed)
 [place] EMERGENCY DEPARTMENT AT [facility] Provider Note   CSN: [id number] Arrival date & time: 05/10/24  1052     Patient presents with: Chest Pain and Dizziness   Sarah Reilly is a 43 y.o. female.   43 year old female presenting with chest pain and woozy sensation.  Patient reports intermittent chest pain for months, this is typically sharp and left-sided, however today shortly after waking she began to have sharp right sided chest pain which is unusual for her.  She notes feeling dizziness which she describes as woozy sensation, no room spinning sensation.  This is also something she has been dealing with for several months, she is seeing physical therapy for vertigo and is being evaluated by neurology in December, she reports improvement in her symptoms since starting physical therapy, she was previously on meclizine  for this but had to discontinue this as it worsened her symptoms.  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 Denies nausea, vomiting, headache, syncope.  She is not symptomatic currently.   Chest Pain Associated symptoms: dizziness   Dizziness Associated symptoms: chest pain        Prior to Admission medications   Medication Sig Start Date End Date Taking? Authorizing Provider  acetaminophen  (TYLENOL ) 500 MG tablet Take 500-1,000 mg by mouth 4 (four) times daily as needed (for pain).    [provider]  carvedilol  (COREG ) 25 MG tablet Take 1 tablet (25 mg total) by mouth 2 (two) times daily with a meal. 11/03/23   Milford, Harlene HERO, FNP  digoxin  (LANOXIN ) 0.125 MG tablet Take 1 tablet (0.125 mg total) by mouth daily. 11/03/23   Milford, Harlene HERO, FNP  lisinopril  (ZESTRIL ) 20 MG tablet TAKE 1 TABLET (20 MG TOTAL) BY MOUTH DAILY. 11/23/23   [name], Toribio SAUNDERS, MD  meclizine  (ANTIVERT ) 25 MG tablet Take 1 tablet (25 mg total) by mouth 3 (three) times daily as needed for  dizziness. 03/11/24   Raford Lenis, MD  methimazole  (TAPAZOLE ) 10 MG tablet Take 10 mg by mouth. 01/03/24   [provider]  methimazole  (TAPAZOLE ) 5 MG tablet Take 5 mg by mouth daily.    [provider]  Misc. Devices MISC Blood pressure monitor.  Diagnosis hypertension Patient not taking: Reported on 03/21/2024 04/29/22   Newlin, Enobong, MD  potassium chloride  SA (KLOR-CON  M) 20 MEQ tablet Take 20 mEq by mouth 2 (two) times a week.    [provider]  spironolactone  (ALDACTONE ) 25 MG tablet Take 12.5 mg by mouth 2 (two) times a week.    [provider]  torsemide  (DEMADEX ) 20 MG tablet TAKE 1 TABLET (20 MG TOTAL) BY MOUTH AS NEEDED. FOR WEIGHT GAIN > 3 LBS OVERNIGHT OR > 5 LBS IN ONE WEEK, INCREASED SWELLING, OR INCREASED SHORTNESS OF BREATH. 03/22/24   [name], Toribio SAUNDERS, MD    Allergies: Celery oil, Shellfish allergy, Entresto  [sacubitril -valsartan ], Hydrocodone, and Latex    Review of Systems  Cardiovascular:  Positive for chest pain.  Neurological:  Positive for dizziness.    Updated Vital Signs  Vitals:   05/10/24 1515 05/10/24 1545 05/10/24 1600 05/10/24 1615  BP: (!) 160/91 (!) 172/87 (!) 168/82 (!) 146/85  Pulse: 65 79 74 60  Resp: 19 18 (!) 22 14  Temp:      TempSrc:      SpO2: 100% 99% 100% 98%     Physical Exam Vitals and nursing note reviewed.  HENT:  Head: Normocephalic.  Eyes:     Extraocular Movements: Extraocular movements intact.     Pupils: Pupils are equal, round, and reactive to light.     Comments: Patient becomes nauseated when demonstrating EOM's, specifically with leftward gaze  Cardiovascular:     Rate and Rhythm: Normal rate and regular rhythm.     Heart sounds: Murmur heard.     Comments: Chest wall discomfort is reproducible on palpation Pulmonary:     Effort: Pulmonary effort is normal.     Breath sounds: Normal breath sounds.  Abdominal:     Palpations: Abdomen is soft.     Tenderness: There is no  abdominal tenderness.  Musculoskeletal:     Cervical back: Normal range of motion.     Right lower leg: No edema.     Left lower leg: No edema.     Comments: Moves all extremities spontaneously without difficulty  Skin:    General: Skin is warm and dry.  Neurological:     General: No focal deficit present.     Mental Status: She is alert and oriented to person, place, and time.     Comments: Facial expressions are symmetric and intact without evidence of facial droop Normal cerebellar testing without ataxia, including finger-to-nose Ambulates on her own without difficulty     (all labs ordered are listed, but only abnormal results are displayed) Labs Reviewed  BASIC METABOLIC PANEL WITH GFR - Abnormal; Notable for the following components:      Result Value   Glucose, Bld 134 (*)    All other components within normal limits  CBC  PREGNANCY, URINE  TROPONIN T, HIGH SENSITIVITY  TROPONIN T, HIGH SENSITIVITY    EKG: None  Radiology: DG Chest 2 View Result Date: 05/10/2024 CLINICAL DATA:  Chest pain. EXAM: CHEST - 2 VIEW COMPARISON:  03/11/2024. FINDINGS: The heart size and mediastinal contours are unchanged. Similar right basilar scarring/atelectasis. No focal consolidation, pleural effusion, or pneumothorax. No overt pulmonary edema. No acute osseous abnormality. IMPRESSION: 1. No acute cardiopulmonary findings. 2. Similar right basilar scarring/atelectasis. Electronically Signed   By: Harrietta Sherry M.D.   On: 05/10/2024 12:40     Procedures   Medications Ordered in the ED - No data to display                                  Medical Decision Making This patient presents to the ED for concern of chest pain, this involves an extensive number of treatment options, and is a complaint that carries with it a high risk of complications and morbidity.  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  The pertinent results include: CBC within normal limits.  BMP unremarkable.  Initial troponin < 15, repeat remains unchanged.  Urine pregnancy test negative.  Imaging Studies ordered:  I ordered imaging studies including CXR  I independently visualized and interpreted imaging which showed 1. No acute cardiopulmonary findings. 2. Similar right basilar scarring/atelectasis  I agree with the radiologist interpretation   Cardiac Monitoring: / EKG:  The patient was maintained on a cardiac monitor.  I personally viewed  and interpreted the cardiac monitored which showed an underlying rhythm of: NSR   Problem List / ED Course / Critical interventions / Medication management I have reviewed the patients home medicines and have made adjustments as needed   Social Determinants of Health:  Former tobacco use, housing and financial instability, depression   Test / Admission - Considered:   Physical exam is largely unremarkable as above.  Vitals are largely unremarkable, patient is hypertensive but this does improve over course of her stay today, she admits that at times her blood pressure drops low and I suspect that she is at risk for hypotension if I were to add on any additional medications, I recommend that she discuss her blood pressure management with her PCP at her appointment next week.   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  She is asymptomatic from chest pain at this time, no shortness of breath, she does not appear fluid overloaded and I do not suspect that she is  experiencing a heart failure exacerbation.  She admits that she has chest pain almost daily for at least the past month, this occurs at rest and with exertion, she has undergone coronary CTA imaging as well as a recent echocardiogram which showed an improved ejection fraction of 40%.  Low suspicion for ACS at this time given reassuring troponin x 2. EKG remains unchanged from previous.  On exam, chest pain is reproducible with gentle palpation of the chest wall, she notes that she has been exercising more recently and feels that her chest pain may be related to muscle strain from this. PERC negative. She is also followed closely by physical therapy for vertigo which has been ongoing for several months, she has no focal neurologic deficits on exam today, although she does become nauseated when demonstrating EOMs which she reports is typical of her vertigo symptoms.  I do not feel that any advanced imaging is warranted at this time, she is scheduled to follow-up with neurology in regard to this in December. I discussed these findings in depth with the patient, she voiced understanding and is in agreement with this plan.  She is scheduled to follow-up with her PCP next week and cardiology/neurology in December, I recommend that she keep these appointments as scheduled.  Strict return precautions discussed, she is appropriate for discharge at this time.  Amount and/or Complexity of Data Reviewed Labs: ordered. Radiology: ordered.        Final diagnoses:  Chest wall pain    ED Discharge Orders     None          Glendia Rocky SAILOR, PA-C 05/10/24 1640    Ellouise Richerd POUR, DO 05/10/24 1914

## 2024-05-15 NOTE — Progress Notes (Signed)
 Name: Sarah Reilly Date of visit: 05/16/24  Chief Complaint   Chief Complaint  Patient presents with  . Annual Exam    Subjective  Sarah Reilly is a 43 y.o. female who presents today at [facility] to have annual physical exam.     History of Present Illness The patient presents for a physical exam. She is accompanied by her husband.  She has been experiencing persistent chest pain, which she describes as a strain similar to a neck click. She reports no palpitations but acknowledges that her anxiety may be contributing to her symptoms. She has sought medical attention for this issue on multiple occasions, including an EKG and cardiac CT in 03/2024, both of which were normal. However, she was unable to complete the imaging due to vein complications. She has not been prescribed any cholesterol medications. She has been managing her pain with Tylenol  and Voltaren gel.  She has a known diagnosis of Graves' disease and is currently on methimazole  15 mg daily. She is under the care of an endocrinologist and has requested a thyroid  function test during this visit.  She has a long-standing history of anxiety and depression, for which she is not currently on any medication. She does not endorse any suicidal ideation or self-harm tendencies. She expresses a preference for therapy over pharmacological intervention.  She has been experiencing ocular symptoms, including blurriness, a burning sensation, and visual disturbances described as stars in her eyes. These symptoms have been present for some time. She does not wear corrective lenses and has not had a recent routine eye examination.  She has observed elevated glucose levels in her blood work on several occasions.  She has been experiencing back pain and is uncertain if it is muscular or renal in origin. She has been informed that her renal function is normal.     Behavioral Health Screening  Patient Health  Questionnaire-2 Score: 5 (05/16/2024  2:10 PM)  Patient Health Questionnaire-9 Score: 14 (05/16/2024  2:10 PM)    Patient's Depression screening is Negative  PHQ-9 score: 14    Current Outpatient Medications  Medication Instructions  . acetaminophen  (TYLENOL ) 500-1,000 mg  . carvediloL  (COREG ) 25 mg  . digoxin  (LANOXIN ) 0.125 mg  . lisinopriL  (PRINIVIL ) 20 mg, Daily  . meclizine  (ANTIVERT ) 25 mg  . methIMAzole  (TAPAZOLE ) 20 mg, Daily  . norethindrone 0.35 mg tab 1 tablet, Daily  . potassium chloride  20 mEq ER tablet 20 mEq  . spironolactone  (ALDACTONE ) 25 mg, Daily  . torsemide  (DEMADEX ) 20 mg tablet TAKE 1 TABLET (20 MG TOTAL) BY MOUTH AS NEEDED. FOR WEIGHT GAIN > 3 LBS OVERNIGHT OR > 5 LBS IN ONE WEEK, INCREASED SWELLING, OR INCREASED S    PAST MEDICAL, SOCIAL & FAMILY HISTORY:   Medical History[1] Surgical History[2] Family History[3] Social History[4]   Allergies: Celery (apium graveolens) (umbelliferae), Shellfish containing products, Hydrocodone, Sacubitril -valsartan , and Latex  IMMUNIZATIONS/ HEALTH STATUS    There is no immunization history on file for this patient.   Health Maintenance Status       Date Due Completion Dates   Varicella Vaccines (1 of 2 - 13+ 2-dose series) Never done ---   HIV Screening Never done ---   Hepatitis C Screening Never done ---   Diabetes Screening Never done ---   Hepatitis B Vaccines (1 of 3 - 19+ 3-dose series) Never done ---   Cervical Cancer Screening Never done ---   HPV Vaccines (1 - 3-dose  SCDM series) Never done ---   Breast Cancer Screening (Mammogram) Never done ---   COVID-19 Vaccine (1 - 2024-25 season) Never done ---   Comprehensive Annual Visit 05/16/2025 05/16/2024   Depression Screening 05/16/2025 05/16/2024   Adult RSV (50+ Years or Pregnancy) (1 - 1-dose 75+ series) 10/06/2055 ---       ROS  Other systems reviewed and negative. No other complaints.  Objective   Vitals:   05/16/24 1411  BP: 128/82   Pulse: 81  Temp: 96.6 F (35.9 C)  SpO2: 99%      Physical Exam: Gen: appears comfortable, large body habitus HEENT: PERRLA, clear oropharynx, no visible thyromegaly.  Cardio: Normal rate, regular rhythm.  No appreciable murmurs.  Resp: CTAB, no labored respirations on room air  MSK:  No gross abnormality no tenderness to palpation of right flank area Skin: Warm and dry Neurologic:  No focal deficits. Appropriate gait Psych: Alert and oriented x 3, cooperative.   GU: external genital is normal appearing. Vaginal mucosa is pink with normal rugae. Physiologic, non-malodorous discharge noted in vaginal vault.  Unable to locate cervix opening due to patient discomfort  ASSESSMENT & PLAN   Orders Placed This Encounter  Procedures  . MG Breast Screening Tomo Bilat  . HIV Screen with Reflex to Confirmation  . Hemoglobin A1C With Estimated Average Glucose  . Hepatitis C Virus (HCV) Antibody Screen With Confirmation  . Lipid Panel  . TSH With Reflex To Free T4  . Ambulatory referral to [facility]  . Ambulatory referral to Ophthalmology  . Ambulatory referral to Obstetrics / Gynecology   No orders of the defined types were placed in this encounter.      Assessment & Plan Annual physical exam    - age appropriate Health Maintenance reviewed and updated accordingly - CMP was recently checked while in the hospital on 05/10/2024 and labs were reviewed, no electrolyte derangement or kidney/hepatic impairment.  CBC without evidence of anemia or leukocytosis.  At this time no indication to repeat CBC or CMP today - maintenance of healthy diet including emphasis on intake of vegetables, fruits, legumes, nuts, whole grains and fish.  Sodium intake should be less than 2300 mg/day, and avoiding trans fat entirely -Daily physical activity as tolerated, 150 minutes/week of moderate intensity  exercise such as brisk walking    Breast cancer screening by mammogram  Orders: SABRA  MG  Breast Screening Tomo Bilat; Future   Diabetes mellitus screening  Orders: .  Hemoglobin A1C With Estimated Average Glucose   Cervical cancer screening  Orders: .  Ambulatory referral to Obstetrics / Gynecology; Future   Screening for lipid disorders  Orders: .  Lipid Panel; Future  Primary hypertension HFrEF (heart failure with reduced ejection fraction) (CMD)       BP Readings from Last 3 Encounters:  05/16/24 128/82  02/07/24 123/74    Chronic, follows with cardiology.  Denies any chest pain or shortness of breath at this time.  Recent echocardiogram from September 2025 reviewed and reported EF is 40%, left ventricular has mild to moderately decreased function with severely dilated left ventricle.  - continue Coreg  50 mg BID - Can continue digoxin  0.125 mg - Continue lisinopril  20 mg daily - Continue Aldactone  12.5 mg mg daily - Patient is not on SGLT2 for GDMT - Also discussed starting statin therapy for the primary prevention of CVD, patient would like to hold off at this time  - Keep follow-up appointment with cardiology scheduled for  May 27, 2024    Positive screening for depression on 9-item Patient Health Questionnaire (PHQ-9) Anxiety and depression  PHQ-9 score: 14    Orders: .  Ambulatory referral to [facility]; Future   - No active SI/HI--call 911 or [phone] if this develops.  - declined starting medication at this time - Discussed referral to behavioral health however given wait times of up to 6 months, pt understands. Referral placed    Graves disease  Orders: .  TSH With Reflex To Free T4; Future - continue methimazole  , pt reports taking 15 mg total (one tab of 5 mg and the other 10 mg tab)  - follows with Endo for management  - requesting her thyroid  checked today  Vision changes Blurry vision  Orders: .  Ambulatory referral to Ophthalmology; Future  Reports blurry and burning of vision bilaterally. Does not wear glasses.  Has not had routine eye exam in many years . Referral to opthalmology placed per her request. Will also check A1c to r/o dm.        Follow up yearly for annual physical or sooner if needed. Patient verbalized agreement to above plan.      The patient was informed about the lab work obtained today. I will personally review any abnormal results, and if further evaluation or follow-up is needed, they will be notified directly through their patient portal or via phone (if they do not have a patient portal set up). The patient acknowledged and understands this.   This note was dictated with voice recognition software. Transcription error is likely and similar sounding words may be inadvertently transcribed incorrectly.   Richerd Ship, MD         [1] Past Medical History: Diagnosis Date  . CHF (congestive heart failure)    (CMD)   . Graves disease   . Hypertension   . Vertigo   [2] History reviewed. No pertinent surgical history. [3] Family History Problem Relation Name Age of Onset  . Heart disease Mother    . Diabetes Mother    . Hypertension Mother    . Hypertension Father    . Heart murmur Sister    [4] Social History Tobacco Use  . Smoking status: Never  . Smokeless tobacco: Never  Vaping Use  . Vaping status: Never Used  Substance Use Topics  . Alcohol use: Not Currently  . Drug use: Never

## 2024-05-16 DIAGNOSIS — Z0001 Encounter for general adult medical examination with abnormal findings: Secondary | ICD-10-CM | POA: Diagnosis not present

## 2024-05-20 NOTE — Progress Notes (Unsigned)
 Cardiology Office Note:   Date:  05/22/2024  ID:  Sarah Reilly, DOB [birth year], MRN [id number] PCP: Murleen Fine, MD  [facility] Providers Cardiologist:  Lynwood Schilling, MD Advanced Heart Failure:  Toribio Fuel, MD {  History of Present Illness:   Sarah Reilly is a 43 y.o. female ho presents a hx of post partum cardiomyopathy dating back to 2010. She was followed in [place] for a time.  She declined an ICD.  She has been seen by myself and HF clinic with Dr Fuel after an echo in May 2019 showed her EF to be 20-25% with severe MR.  Dr Fuel recommended aggressive medical Rx and a repeat echo in 3 months, a cMRI and CPX.  The patient could not tolerate Entresto  secondary to abdominal pain and was placed on Lisinopril .  Sleep study in Aug 2019 did not suggest she needed C-pap.  Her most recent echo in Sept 2025 demonstrated an EF of 40 %.   She was recently noted to have a very low TSH and elevated T4.    She has multiple no shows and non adherence with meds.  She was dismissed from [facility] at one point.    She is managed elsewhere now and is on methimazole .    She presents for follow-up.  She has had blurred vision in her right eye just recently and is going to see an ophthalmologist soon.  She has had some increased shortness of breath since about Sunday.  She chronically sleeps at about 45 degrees and this is not changed from previous.  She has not had any new chest pressure, neck or arm discomfort.  She has not had any new weight gain or edema.  She is taking her meds as listed with her spironolactone  a couple of times per week as she says she was directed.  Her blood pressure has been creeping up.  She has had chest pain but she has had a previous extensive workup.  Her last CT which was to be contrasted her IV blew but there was 0 calcium.  She has never had other evidence of ischemic heart disease.  This has been a chronic rheumatic chest pain  pattern   ROS: As stated in the HPI and negative for all other systems.  Studies Reviewed:    EKG:     NA  Risk Assessment/Calculations:      Physical Exam:   VS:  BP (!) 146/96 (BP Location: Left Arm, Patient Position: Sitting, Cuff Size: Large)   Pulse 83   Ht 5' 7 (1.702 m)   Wt 249 lb 9.6 oz (113.2 kg)   SpO2 97%   BMI 39.09 kg/m    Wt Readings from Last 3 Encounters:  05/22/24 249 lb 9.6 oz (113.2 kg)  03/21/24 248 lb 9.6 oz (112.8 kg)  03/11/24 246 lb (111.6 kg)     GEN: Well nourished, well developed in no acute distress NECK: No JVD; No carotid bruits CARDIAC: RRR, 3 out of 6 holosystolic murmur heard at the apex and radiating slightly to the axilla and up anteriorly, no diastolic murmurs, rubs, gallops RESPIRATORY:  Clear to auscultation without rales, wheezing or rhonchi  ABDOMEN: Soft, non-tender, non-distended EXTREMITIES:  No edema; No deformity   ASSESSMENT AND PLAN:   CHRONIC SYSTOLIC HF:     She has been on the GDMT that she tolerates.  She as not wanted an ICD.   I do think she would comply with taking her  spironolactone  12.5 mg once daily and I will try to slowly titrate this.  She has not tolerated a lot of med titration.  MR:  Moderate to severe. She had previously refused consideration for MitraClip and TEE.  The last echo demonstrated to be more in the moderate range and I will follow this up with an echo next year.   PALPITATIONS:  Monitor in May demonstrated no arrhythmias.  Triggered events were associated with NSR.  No further evaluation.  CHEST PAIN:   I reviewed multiple ER records and her troponins were always normal.  EKG was nonacute.  I think the pretest probability of obstructive coronary disease as an etiology is very low.  No further ischemia workup.  SOB: I will check a BNP level.  Exam would suggest that she is euvolemic.  We talked about as needed dosing of her diuretic.      Follow up follow for med titration.   Signed, Lynwood Schilling, MD

## 2024-05-22 ENCOUNTER — Ambulatory Visit: Attending: Cardiology | Admitting: Cardiology

## 2024-05-22 ENCOUNTER — Encounter: Payer: Self-pay | Admitting: Cardiology

## 2024-05-22 VITALS — BP 146/96 | HR 83 | Ht 67.0 in | Wt 249.6 lb

## 2024-05-22 DIAGNOSIS — R002 Palpitations: Secondary | ICD-10-CM | POA: Insufficient documentation

## 2024-05-22 DIAGNOSIS — I340 Nonrheumatic mitral (valve) insufficiency: Secondary | ICD-10-CM | POA: Diagnosis not present

## 2024-05-22 DIAGNOSIS — R079 Chest pain, unspecified: Secondary | ICD-10-CM | POA: Insufficient documentation

## 2024-05-22 DIAGNOSIS — I5022 Chronic systolic (congestive) heart failure: Secondary | ICD-10-CM | POA: Diagnosis not present

## 2024-05-22 MED ORDER — SPIRONOLACTONE 25 MG PO TABS: 12.5000 mg | ORAL_TABLET | Freq: Every day | ORAL | 3 refills | Status: AC

## 2024-05-22 NOTE — Patient Instructions (Signed)
 Medication Instructions:  Increase Spironolactone  to 12.5 mg once daily *If you need a refill on your cardiac medications before your next appointment, please call your pharmacy*  Lab Work: BNP today at [facility] If you have labs (blood work) drawn today and your tests are completely normal, you will receive your results only by: MyChart Message (if you have MyChart) OR A paper copy in the mail If you have any lab test that is abnormal or we need to change your treatment, we will call you to review the results.  Testing/Procedures: NONE  Follow-Up: At [facility], you and your health needs are our priority.  As part of our continuing mission to provide you with exceptional heart care, our providers are all part of one team.  This team includes your primary Cardiologist (physician) and Advanced Practice Providers or APPs (Physician Assistants and Nurse Practitioners) who all work together to provide you with the care you need, when you need it.  Your next appointment:   3 month(s)  Provider:   Lynwood Schilling, MD    We recommend signing up for the patient portal called MyChart.  Sign up information is provided on this After Visit Summary.  MyChart is used to connect with patients for Virtual Visits (Telemedicine).  Patients are able to view lab/test results, encounter notes, upcoming appointments, etc.  Non-urgent messages can be sent to your provider as well.   To learn more about what you can do with MyChart, go to [email or web address].   Other Instructions Blood pressure diary: take your blood pressure twice daily for 10 days and send us  the readings

## 2024-05-23 LAB — PRO B NATRIURETIC PEPTIDE: NT-Pro BNP: 249 pg/mL — ABNORMAL HIGH (ref 0–130)

## 2024-05-24 ENCOUNTER — Other Ambulatory Visit (HOSPITAL_COMMUNITY): Payer: Self-pay | Admitting: Internal Medicine

## 2024-05-25 ENCOUNTER — Ambulatory Visit: Payer: Self-pay | Admitting: Cardiology

## 2024-05-27 ENCOUNTER — Encounter (HOSPITAL_COMMUNITY)

## 2024-05-29 NOTE — Progress Notes (Unsigned)
 Initial neurology clinic note  Sarah Reilly MRN: [id number] DOB: Sep 19, 1980  Referring provider: Carlie Clark, MD  Primary care provider: Murleen Fine, MD  Reason for consult:  dizziness/vertigo  Subjective:  This is Ms. Sarah Reilly, a 43 y.o. right-handed female with a medical history of HTN, HFrEF, moderate to severe mitral regurgitation, Graves disease who presents to neurology clinic with dizziness and vertigo. The patient is accompanied by dizziness/vertigo.  Patient's symptoms started with vertigo about 1 year. She describes vertigo as her heart pounding and feeling off balance. She felt her equilibrium. The symptoms lasted 4-5 minutes the first time, but now last less than 1 minute. She has had dizziness in the past due to heart failure, but had never had symptoms like that. She mentions a feeling of doom coming over her. This occurred more in the spring and summer.   She also has episodes of dizziness she describes as lightheaded with nausea. It can last seconds or be off and on all day. This occurs every other day.  She was having chest pain and numbness and tingling on the left side of her body (face and arm). Her BP is usually high when she has these symptoms (170s/100s). She states cardiology felt her symptoms were not cardiac.  She had some pressure in her head and ears so she went ENT (Dr. Carlie at [facility]). She went to vestibular rehab and reported palpitations. The last appointment was 04/26/24.  She has had 2 episodes of what she calls vertigo since 04/26/24. One woke her out of sleep when she was laying still on her back. She had to sit up and closed her eyes. This went away after 2 minutes.  She does endorse some sensitivity to light and sound. She feels like her vision is blurry. She is seeing ophtho on 06/26/24.  She used to have bad migraines as a teenager. She was seen by neurology in the past. She has never been on a migraine medication. She gets  a mild headache about once a week, but does not need to take medication. She occasionally gets a headache that is severe and causes nausea. This only occurs about 2 times per year. She denies significant neck pain.  Smoker: no OCP use: will be starting birth control pills soon; she is sexually active Caffiene use: none EtOH use: none Restrictive diet: no Family history of neurologic disease including headaches: daughter gets occasional headaches  Patient's TSH was elevated to 32.199. She was told to reduce her medications(?).  Of note, she has some palpitations today, but not currently dizzy or having vertigo.  MEDICATIONS:  Outpatient Encounter Medications as of 06/07/2024  Medication Sig Note   acetaminophen  (TYLENOL ) 500 MG tablet Take 500-1,000 mg by mouth 4 (four) times daily as needed (for pain).    carvedilol  (COREG ) 25 MG tablet Take 1 tablet (25 mg total) by mouth 2 (two) times daily with a meal.    digoxin  (LANOXIN ) 0.125 MG tablet Take 1 tablet (0.125 mg total) by mouth daily.    lisinopril  (ZESTRIL ) 20 MG tablet TAKE 1 TABLET (20 MG TOTAL) BY MOUTH DAILY.    methimazole  (TAPAZOLE ) 5 MG tablet Take 5 mg by mouth daily.    Misc. Devices MISC Blood pressure monitor.  Diagnosis hypertension    norethindrone (MICRONOR) 0.35 MG tablet Take 1 tablet by mouth daily.    potassium chloride  SA (KLOR-CON  M) 20 MEQ tablet Take 20 mEq by mouth 2 (two) times a week. 03/21/2024: Patient  takes 1 tablet 3 times per week  on Monday Wednesday and Saturday.   spironolactone  (ALDACTONE ) 25 MG tablet Take 0.5 tablets (12.5 mg total) by mouth daily.    torsemide  (DEMADEX ) 20 MG tablet TAKE 1 TABLET (20 MG TOTAL) BY MOUTH AS NEEDED. FOR WEIGHT GAIN > 3 LBS OVERNIGHT OR > 5 LBS IN ONE WEEK, INCREASED SWELLING, OR INCREASED SHORTNESS OF BREATH. (Patient taking differently: Take 20 mg by mouth as needed.)    meclizine  (ANTIVERT ) 25 MG tablet Take 1 tablet (25 mg total) by mouth 3 (three) times daily as  needed for dizziness. (Patient not taking: Reported on 06/07/2024)    methimazole  (TAPAZOLE ) 10 MG tablet Take 10 mg by mouth. (Patient not taking: Reported on 06/07/2024) 03/21/2024: Patient takes 1 tablet by mouth Daily.   No facility-administered encounter medications on file as of 06/07/2024.    PAST MEDICAL HISTORY: Past Medical History:  Diagnosis Date   Cardiomyopathy    CHF (congestive heart failure) (HCC)    HTN (hypertension)    Mitral regurgitation     PAST SURGICAL HISTORY: History reviewed. No pertinent surgical history.  ALLERGIES: Allergies  Allergen Reactions   Celery Oil Anaphylaxis   Shellfish Allergy Anaphylaxis   Entresto  [Sacubitril -Valsartan ] Other (See Comments)    GI upset   Hydrocodone Other (See Comments)    Reaction happened in the spine   Latex Rash and Other (See Comments)    EKG leads cause rashes- cannot tolerate for any extended length of time    FAMILY HISTORY: Family History  Problem Relation Age of Onset   Heart disease Mother        No details   Hypertension Mother    Diabetes Mother    Hypertension Father    Heart murmur Sister     SOCIAL HISTORY: Social History   Tobacco Use   Smoking status: Former   Smokeless tobacco: Never  Vaping Use   Vaping status: Never Used  Substance Use Topics   Alcohol use: Yes    Alcohol/week: 4.0 standard drinks of alcohol    Types: 3 Glasses of wine, 1 Standard drinks or equivalent per week    Comment: Occassional   Drug use: Never   Social History   Social History Narrative   She has 4 children ages 46 and under.  She stays at home. She does not smoke cigarettes or drink alcohol. Does not get regular exercise.     Objective:  Vital Signs:  BP 137/78   Pulse 76   Wt 249 lb 6.4 oz (113.1 kg)   SpO2 98%   BMI 39.06 kg/m   General: No acute distress.  Patient appears well-groomed.   Head:  Normocephalic/atraumatic Eyes:  fundi examined, disc margins clear, no obvious  papilledema Neck: supple, full range of motion Heart: regular rate and rhythm Lungs: Clear to auscultation bilaterally. Vascular: No carotid bruits.  Neurological Exam: Mental status: alert and oriented, speech fluent and not dysarthric, language intact.  Cranial nerves: CN I: not tested CN II: pupils equal, round and reactive to light, visual fields intact CN III, IV, VI:  full range of motion, no nystagmus, no ptosis CN V: facial sensation intact. CN VII: upper and lower face symmetric CN VIII: hearing intact CN IX, X: uvula midline CN XI: sternocleidomastoid and trapezius muscles intact CN XII: tongue midline  Head impulse test did not cause dizziness or vertigo  Bulk & Tone: normal, no fasciculations. Motor:  muscle strength 5/5 throughout Deep Tendon Reflexes:  2+ throughout.   Sensation:  Pinprick diminished in LLE compared to RLE, otherwise intact Finger to nose testing:  Without dysmetria.   Gait:  Normal station and stride.  Romberg negative.   Labs and Imaging review: Internal labs: NT-pro BNP elevated to 249  03/21/24: CRP wnl ESR wnl  External labs: 05/16/24: HbA1c: 5.1 TSH elevated to 32.199 Free T4 low at 0.5 HIV non-reactive Lipid panel: tChol 158, 100, TG 102  Imaging/Procedures: Echocardiogram (03/21/24): 1. Left ventricular ejection fraction, by estimation, is 40%. The left  ventricle has mild to moderately decreased function. The left ventricle  demonstrates global hypokinesis. The left ventricular internal cavity size  was severely dilated. Left  ventricular diastolic parameters are indeterminate.   2. Right ventricular systolic function is normal. The right ventricular  size is normal. There is normal pulmonary artery systolic pressure.   3. Left atrial size was severely dilated.   4. Abnormal leaflet thickening, There is eccentric mitral regurgitation  that is posteriorly directed. Mechanism appears to be related to  functional mitral  regurgitation. The mitral valve is abnormal. Moderate  mitral valve regurgitation. No evidence of  mitral stenosis. The mean mitral valve gradient is 5.0 mmHg.   5. The aortic valve is tricuspid. Aortic valve regurgitation is not  visualized. No aortic stenosis is present.   6. The inferior vena cava is normal in size with greater than 50%  respiratory variability, suggesting right atrial pressure of 3 mmHg.   Assessment/Plan:  Sarah Reilly is a 43 y.o. female who presents for evaluation of dizziness/vertigo. She has a relevant medical history of HTN, HFrEF, moderate to severe mitral regurgitation, Graves disease. Her neurological examination is essentially normal today, and I am not able to provoke any dizziness. Available diagnostic data is significant for TSH very elevated with low free T4. The etiology of patient's symptoms is currently unclear. Though she has been seen by other specialities and told her work up was normal, she has a very complicated cardiac history on many medications and Grave's disease with very abnormal TSH that could at least be contributing to symptoms. I do not see clear neurologic disease and symptoms do not sound like migraine, but this is possible. I also considered IIH, but fundoscopy did not show concern for me for papilledema. Intracranial pathology is less likely but MRI brain is reasonable given her complicated history.  PLAN: -Blood work: B1, B12, folate, TSH, free T4 -MRI brain w/wo contrast -Patient to speak to endocrinology about TSH -Discussed potential migraine medication such as topamax, but patient would like to defer  -Return to clinic ~3 months  The impression above as well as the plan as outlined below were extensively discussed with the patient who voiced understanding. All questions were answered to their satisfaction.  When available, results of the above investigations and possible further recommendations will be communicated to the  patient via telephone/MyChart. Patient to call office if not contacted after expected testing turnaround time.   Total time spent reviewing records, interview, history/exam, documentation, and coordination of care on day of encounter:  55 min   Thank you for allowing me to participate in patient's care.  If I can answer any additional questions, I would be pleased to do so.  Venetia Potters, MD   CC: Murleen Fine, MD [address]  [address] [place] [address]  CC: Referring provider: Carlie Clark, MD [address] [place],  [place] [address]

## 2024-06-07 ENCOUNTER — Encounter: Payer: Self-pay | Admitting: Neurology

## 2024-06-07 ENCOUNTER — Ambulatory Visit: Admitting: Neurology

## 2024-06-07 ENCOUNTER — Other Ambulatory Visit

## 2024-06-07 VITALS — BP 137/78 | HR 76 | Wt 249.4 lb

## 2024-06-07 DIAGNOSIS — R202 Paresthesia of skin: Secondary | ICD-10-CM | POA: Diagnosis not present

## 2024-06-07 DIAGNOSIS — R5383 Other fatigue: Secondary | ICD-10-CM | POA: Diagnosis not present

## 2024-06-07 DIAGNOSIS — R42 Dizziness and giddiness: Secondary | ICD-10-CM | POA: Diagnosis not present

## 2024-06-07 DIAGNOSIS — R200 Anesthesia of skin: Secondary | ICD-10-CM

## 2024-06-07 DIAGNOSIS — R002 Palpitations: Secondary | ICD-10-CM | POA: Diagnosis not present

## 2024-06-07 NOTE — Patient Instructions (Signed)
 I saw you today for dizziness and vertigo. I am not sure the cause of your symptoms.  I noticed your thyroid  function was very off at check in 04/2024. This could cause symptoms. I'm not convinced symptoms are neurologic, but we will investigate further with the following: -Blood work today -MRI of your brain  I want you to speak to your endocrinologist about your symptoms and recent TSH to make sure the medications are correct.  We discussed trying a migraine medication if all else fails, but we agreed to hold off for now.  I will be in touch when I have your results and see you back in clinic in about 3 months.  Please let me know if you have any questions or concerns in the meantime.  The physicians and staff at [facility] are committed to providing excellent care. You may receive a survey requesting feedback about your experience at our office. We strive to receive very good responses to the survey questions. If you feel that your experience would prevent you from giving the office a very good  response, please contact our office to try to remedy the situation. We may be reached at [phone]. Thank you for taking the time out of your busy day to complete the survey.  Venetia Potters, MD [facility]

## 2024-06-11 ENCOUNTER — Ambulatory Visit: Admitting: Cardiology

## 2024-06-12 ENCOUNTER — Ambulatory Visit: Payer: Self-pay | Admitting: Neurology

## 2024-06-12 LAB — VITAMIN B1: Vitamin B1 (Thiamine): 6 nmol/L — ABNORMAL LOW (ref 8–30)

## 2024-06-12 LAB — B12 AND FOLATE PANEL
Folate: 3.8 ng/mL — ABNORMAL LOW
Vitamin B-12: 663 pg/mL (ref 200–1100)

## 2024-06-12 LAB — T4, FREE: Free T4: 0.7 ng/dL — ABNORMAL LOW (ref 0.8–1.8)

## 2024-06-12 LAB — TSH+FREE T4: TSH W/REFLEX TO FT4: 13.4 m[IU]/L — ABNORMAL HIGH

## 2024-07-08 ENCOUNTER — Encounter: Payer: Self-pay | Admitting: Neurology

## 2024-07-12 ENCOUNTER — Ambulatory Visit
Admission: RE | Admit: 2024-07-12 | Discharge: 2024-07-12 | Disposition: A | Discharge status: Home / Self Care | Source: Ambulatory Visit | Attending: Neurology | Admitting: Neurology

## 2024-07-12 DIAGNOSIS — R42 Dizziness and giddiness: Secondary | ICD-10-CM

## 2024-07-12 DIAGNOSIS — R002 Palpitations: Secondary | ICD-10-CM

## 2024-07-12 DIAGNOSIS — R5383 Other fatigue: Secondary | ICD-10-CM

## 2024-07-12 DIAGNOSIS — R200 Anesthesia of skin: Secondary | ICD-10-CM

## 2024-07-26 ENCOUNTER — Other Ambulatory Visit (HOSPITAL_COMMUNITY): Payer: Self-pay | Admitting: Internal Medicine

## 2024-09-18 ENCOUNTER — Ambulatory Visit: Payer: Self-pay | Admitting: Neurology
